# Patient Record
Sex: Female | Born: 1995 | Race: Black or African American | Hispanic: No | Marital: Single | Smoking: Former smoker
Health system: Southern US, Community
[De-identification: ages and names within clinical notes are randomized; demographics above are authoritative.]

## PROBLEM LIST (undated history)

## (undated) ENCOUNTER — Inpatient Hospital Stay (HOSPITAL_COMMUNITY): Payer: Self-pay

## (undated) DIAGNOSIS — O021 Missed abortion: Secondary | ICD-10-CM

## (undated) DIAGNOSIS — O169 Unspecified maternal hypertension, unspecified trimester: Secondary | ICD-10-CM

## (undated) DIAGNOSIS — O139 Gestational [pregnancy-induced] hypertension without significant proteinuria, unspecified trimester: Secondary | ICD-10-CM

## (undated) DIAGNOSIS — F32A Depression, unspecified: Secondary | ICD-10-CM

## (undated) DIAGNOSIS — F329 Major depressive disorder, single episode, unspecified: Secondary | ICD-10-CM

## (undated) HISTORY — PX: WISDOM TOOTH EXTRACTION: SHX21

## (undated) HISTORY — DX: Gestational (pregnancy-induced) hypertension without significant proteinuria, unspecified trimester: O13.9

---
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
## 2013-01-23 ENCOUNTER — Emergency Department (HOSPITAL_COMMUNITY): Payer: Medicaid Other

## 2013-01-23 ENCOUNTER — Emergency Department (HOSPITAL_COMMUNITY)
Admission: EM | Admit: 2013-01-23 | Discharge: 2013-01-23 | Disposition: A | Discharge status: Home / Self Care | Payer: Medicaid Other | Attending: Emergency Medicine | Admitting: Emergency Medicine

## 2013-01-23 ENCOUNTER — Encounter (HOSPITAL_COMMUNITY): Payer: Self-pay | Admitting: Nurse Practitioner

## 2013-01-23 DIAGNOSIS — Z3202 Encounter for pregnancy test, result negative: Secondary | ICD-10-CM | POA: Insufficient documentation

## 2013-01-23 DIAGNOSIS — R071 Chest pain on breathing: Secondary | ICD-10-CM | POA: Insufficient documentation

## 2013-01-23 DIAGNOSIS — R0789 Other chest pain: Secondary | ICD-10-CM

## 2013-01-23 DIAGNOSIS — J45909 Unspecified asthma, uncomplicated: Secondary | ICD-10-CM | POA: Insufficient documentation

## 2013-01-23 DIAGNOSIS — R300 Dysuria: Secondary | ICD-10-CM | POA: Insufficient documentation

## 2013-01-23 DIAGNOSIS — N72 Inflammatory disease of cervix uteri: Secondary | ICD-10-CM | POA: Insufficient documentation

## 2013-01-23 DIAGNOSIS — F17200 Nicotine dependence, unspecified, uncomplicated: Secondary | ICD-10-CM | POA: Insufficient documentation

## 2013-01-23 DIAGNOSIS — Z79899 Other long term (current) drug therapy: Secondary | ICD-10-CM | POA: Insufficient documentation

## 2013-01-23 LAB — WET PREP, GENITAL
Clue Cells Wet Prep HPF POC: NONE SEEN
Trich, Wet Prep: NONE SEEN

## 2013-01-23 LAB — CBC WITH DIFFERENTIAL/PLATELET
Basophils Absolute: 0 10*3/uL (ref 0.0–0.1)
Basophils Relative: 0 % (ref 0–1)
Eosinophils Relative: 2 % (ref 0–5)
Hemoglobin: 13.3 g/dL (ref 12.0–15.0)
MCH: 33.1 pg (ref 26.0–34.0)
Monocytes Absolute: 0.5 10*3/uL (ref 0.1–1.0)
Platelets: 233 10*3/uL (ref 150–400)
RBC: 4.02 MIL/uL (ref 3.87–5.11)
WBC: 6.3 10*3/uL (ref 4.0–10.5)

## 2013-01-23 LAB — COMPREHENSIVE METABOLIC PANEL
AST: 20 U/L (ref 0–37)
Albumin: 4.3 g/dL (ref 3.5–5.2)
Calcium: 9.8 mg/dL (ref 8.4–10.5)
Creatinine, Ser: 0.73 mg/dL (ref 0.50–1.10)
Total Protein: 7.3 g/dL (ref 6.0–8.3)

## 2013-01-23 LAB — URINALYSIS, ROUTINE W REFLEX MICROSCOPIC
Bilirubin Urine: NEGATIVE
Nitrite: NEGATIVE
Protein, ur: 30 mg/dL — AB
Specific Gravity, Urine: 1.025 (ref 1.005–1.030)
Urobilinogen, UA: 0.2 mg/dL (ref 0.0–1.0)

## 2013-01-23 MED ORDER — CEFTRIAXONE SODIUM 250 MG IJ SOLR
250.0000 mg | Freq: Once | INTRAMUSCULAR | Status: AC
  Administered 2013-01-23: 250 mg via INTRAMUSCULAR
  Filled 2013-01-23: qty 250

## 2013-01-23 MED ORDER — TRAMADOL HCL 50 MG PO TABS: 50.0000 mg | ORAL_TABLET | Freq: Four times a day (QID) | ORAL | Status: DC | PRN

## 2013-01-23 MED ORDER — KETOROLAC TROMETHAMINE 60 MG/2ML IM SOLN
60.0000 mg | Freq: Once | INTRAMUSCULAR | Status: AC
  Administered 2013-01-23: 60 mg via INTRAMUSCULAR
  Filled 2013-01-23: qty 2

## 2013-01-23 MED ORDER — GI COCKTAIL ~~LOC~~
30.0000 mL | Freq: Once | ORAL | Status: AC
  Administered 2013-01-23: 30 mL via ORAL
  Filled 2013-01-23: qty 30

## 2013-01-23 MED ORDER — DOXYCYCLINE HYCLATE 50 MG PO CAPS: 100.0000 mg | ORAL_CAPSULE | Freq: Two times a day (BID) | ORAL | Status: DC

## 2013-01-23 MED ORDER — LIDOCAINE HCL (PF) 1 % IJ SOLN
INTRAMUSCULAR | Status: AC
  Administered 2013-01-23: 0.9 mL
  Filled 2013-01-23: qty 5

## 2013-01-23 NOTE — ED Notes (Signed)
Patient transported to X-ray 

## 2013-01-23 NOTE — ED Notes (Signed)
Discharge instructions reviewed. Pt verbalized understanding.  

## 2013-01-23 NOTE — ED Notes (Signed)
C/o mid abd pain x 1 week, denies n/v/d, last BM a week ago. C/o dysuria x 2 days, denies vaginal discharge. C/o non radiating midsternal CP x 2 days, "but it's probably my asthma". Denies SOB, fever, chills, cold, cough.

## 2013-01-23 NOTE — ED Provider Notes (Signed)
History     CSN: [id number]  Arrival date & time 01/23/13  1240   First MD Initiated Contact with Patient 01/23/13 1308      Chief Complaint  Patient presents with  . Abdominal Pain    (Consider location/radiation/quality/duration/timing/severity/associated sxs/prior treatment) HPI Pt presents with 2 complaints.   Abdominal pain present for 1 week. States she has lower abd pain associated with painful urination. No fever or chills. No N/V/D/C. No vaginal d/c. Pt states she just started her period. She is sexually active but uses condoms.   Pt has had 3 days of gradual onset central chest pain. Worse with deep breathing and palpation. No cough, SOB. No recent travel/surgeries. Not of oral birth control pills. No trauma.  Past Medical History  Diagnosis Date  . Asthma     History reviewed. No pertinent past surgical history.  History reviewed. No pertinent family history.  History  Substance Use Topics  . Smoking status: Current Every Day Smoker    Types: Cigarettes  . Smokeless tobacco: Not on file  . Alcohol Use: Yes    OB History   Grav Para Term Preterm Abortions TAB SAB Ect Mult Living                  Review of Systems  Constitutional: Negative for fever and chills.  HENT: Negative for neck pain.   Respiratory: Negative for cough, chest tightness, shortness of breath and wheezing.   Cardiovascular: Positive for chest pain. Negative for palpitations and leg swelling.  Gastrointestinal: Positive for abdominal pain. Negative for nausea, vomiting, diarrhea, constipation and blood in stool.  Genitourinary: Positive for dysuria. Negative for frequency, flank pain, vaginal discharge and vaginal pain.  Musculoskeletal: Negative for myalgias and back pain.  Skin: Negative for rash and wound.  Neurological: Negative for dizziness, syncope, weakness, light-headedness, numbness and headaches.  All other systems reviewed and are negative.    Allergies  Tomato  Home  Medications   Current Outpatient Rx  Name  Route  Sig  Dispense  Refill  . albuterol (PROVENTIL HFA;VENTOLIN HFA) 108 (90 BASE) MCG/ACT inhaler   Inhalation   Inhale 2 puffs into the lungs every 4 (four) hours as needed for shortness of breath.         Marland Kitchen PRESCRIPTION MEDICATION   Inhalation   Inhale 2 puffs into the lungs every 6 (six) hours as needed (Asthma). Second inhaler (not albuterol)         . doxycycline (VIBRAMYCIN) 50 MG capsule   Oral   Take 2 capsules (100 mg total) by mouth 2 (two) times daily.   14 capsule   0   . traMADol (ULTRAM) 50 MG tablet   Oral   Take 1 tablet (50 mg total) by mouth every 6 (six) hours as needed for pain.   15 tablet   0     BP 104/51  Pulse 62  Temp(Src) 98.8 F (37.1 C) (Oral)  Resp 18  Ht 4\' 11"  (1.499 m)  Wt 106 lb (48.081 kg)  BMI 21.4 kg/m2  SpO2 100%  LMP 01/23/2013  Physical Exam  Nursing note and vitals reviewed. Constitutional: She is oriented to person, place, and time. She appears well-developed and well-nourished. No distress.  HENT:  Head: Normocephalic and atraumatic.  Mouth/Throat: Oropharynx is clear and moist.  Eyes: EOM are normal. Pupils are equal, round, and reactive to light.  Neck: Normal range of motion. Neck supple.  Cardiovascular: Normal rate and regular rhythm.  Pulmonary/Chest: Effort normal and breath sounds normal. No respiratory distress. She has no wheezes. She has no rales. She exhibits tenderness (reproduced chest pain with palpation of sternum and costal cartilage ).  Abdominal: Soft. Bowel sounds are normal. She exhibits no distension and no mass. There is tenderness (moderate epigastric ttp. mild bl lower quad tenderness. No rebound or guarding. ). There is no rebound and no guarding.  Genitourinary: Vaginal discharge found.  Small amount of blood mixed with purulent d/c in vaginal vault. +CMT. No fundal or adnexal ttp  Musculoskeletal: Normal range of motion. She exhibits no edema and  no tenderness.  No calf tenderness or swelling  Neurological: She is alert and oriented to person, place, and time.  Moves all ext without deficit  Skin: Skin is warm and dry. No rash noted. No erythema.  Psychiatric: She has a normal mood and affect. Her behavior is normal.    ED Course  Procedures (including critical care time)  Labs Reviewed  WET PREP, GENITAL - Abnormal; Notable for the following:    WBC, Wet Prep HPF POC FEW (*)    All other components within normal limits  URINALYSIS, ROUTINE W REFLEX MICROSCOPIC - Abnormal; Notable for the following:    Hgb urine dipstick SMALL (*)    Ketones, ur 15 (*)    Protein, ur 30 (*)    All other components within normal limits  CBC WITH DIFFERENTIAL - Abnormal; Notable for the following:    MCHC 36.4 (*)    All other components within normal limits  COMPREHENSIVE METABOLIC PANEL - Abnormal; Notable for the following:    Potassium 3.1 (*)    All other components within normal limits  GC/CHLAMYDIA PROBE AMP  LIPASE, BLOOD  URINE MICROSCOPIC-ADD ON  POCT PREGNANCY, URINE   Dg Chest 2 View  01/23/2013   *RADIOLOGY REPORT*  Clinical Data: Mid chest pain and abdominal pain.  CHEST - 2 VIEW  Comparison: Chest x-ray in 04/21/2012.  Findings: Lung volumes are normal.  No consolidative airspace disease.  No pleural effusions.  No pneumothorax.  No pulmonary nodule or mass noted.  Pulmonary vasculature and the cardiomediastinal silhouette are within normal limits.  IMPRESSION: 1. No radiographic evidence of acute cardiopulmonary disease.   Original Report Authenticated By: Trudie Reed, M.D.     1. Chest wall pain   2. Cervicitis      Date: 01/23/2013  Rate: 73  Rhythm: normal sinus rhythm  QRS Axis: normal  Intervals: normal  ST/T Wave abnormalities: normal  Conduction Disutrbances:none  Narrative Interpretation:   Old EKG Reviewed: none available    MDM          Loren Racer, MD 01/24/13 [phone]

## 2013-01-23 NOTE — ED Notes (Signed)
Pt c/o tightness and pain in chest, generalized abd pain, and painful urination for past days. A&Ox4, resp e/u

## 2013-01-25 LAB — GC/CHLAMYDIA PROBE AMP
CT Probe RNA: NEGATIVE
GC Probe RNA: POSITIVE — AB

## 2013-01-26 ENCOUNTER — Telehealth (HOSPITAL_COMMUNITY): Payer: Self-pay | Admitting: Emergency Medicine

## 2013-01-26 NOTE — ED Notes (Signed)
+  Gonorrhea. Patient treated with Rocephin and Doxycyline. Per protocol MD. [facility] faxed.

## 2013-01-26 NOTE — ED Notes (Signed)
Patient informed of positive results after id'd x 2 and informed of need to notify partner to be treated. 

## 2013-01-26 NOTE — ED Notes (Signed)
Patient has +Gonorrhea. °

## 2013-02-15 ENCOUNTER — Encounter (HOSPITAL_COMMUNITY): Payer: Self-pay | Admitting: Vascular Surgery

## 2013-02-15 ENCOUNTER — Emergency Department (HOSPITAL_COMMUNITY)
Admission: EM | Admit: 2013-02-15 | Discharge: 2013-02-15 | Disposition: A | Discharge status: Home / Self Care | Payer: Medicaid Other | Attending: Emergency Medicine | Admitting: Emergency Medicine

## 2013-02-15 DIAGNOSIS — J45909 Unspecified asthma, uncomplicated: Secondary | ICD-10-CM | POA: Insufficient documentation

## 2013-02-15 DIAGNOSIS — M25559 Pain in unspecified hip: Secondary | ICD-10-CM | POA: Insufficient documentation

## 2013-02-15 DIAGNOSIS — R102 Pelvic and perineal pain: Secondary | ICD-10-CM

## 2013-02-15 DIAGNOSIS — Z3202 Encounter for pregnancy test, result negative: Secondary | ICD-10-CM | POA: Insufficient documentation

## 2013-02-15 DIAGNOSIS — F17200 Nicotine dependence, unspecified, uncomplicated: Secondary | ICD-10-CM | POA: Insufficient documentation

## 2013-02-15 LAB — WET PREP, GENITAL: Clue Cells Wet Prep HPF POC: NONE SEEN

## 2013-02-15 LAB — POCT PREGNANCY, URINE: Preg Test, Ur: NEGATIVE

## 2013-02-15 LAB — URINALYSIS, ROUTINE W REFLEX MICROSCOPIC
Glucose, UA: NEGATIVE mg/dL
Ketones, ur: 15 mg/dL — AB
Leukocytes, UA: NEGATIVE
pH: 6 (ref 5.0–8.0)

## 2013-02-15 MED ORDER — AZITHROMYCIN 250 MG PO TABS
1000.0000 mg | ORAL_TABLET | Freq: Once | ORAL | Status: AC
  Administered 2013-02-15: 1000 mg via ORAL
  Filled 2013-02-15: qty 4

## 2013-02-15 MED ORDER — CEFTRIAXONE SODIUM 250 MG IJ SOLR
250.0000 mg | Freq: Once | INTRAMUSCULAR | Status: AC
  Administered 2013-02-15: 250 mg via INTRAMUSCULAR
  Filled 2013-02-15: qty 250

## 2013-02-15 MED ORDER — LIDOCAINE HCL (PF) 1 % IJ SOLN
INTRAMUSCULAR | Status: AC
  Administered 2013-02-15: 0.9 mL
  Filled 2013-02-15: qty 5

## 2013-02-15 NOTE — ED Notes (Signed)
Pt reports to the ED for eval of bilateral lower quadrant abdominal pain. Pt reports she has had trouble with this before and was seen here and d/c but she is unsure of what she was dx with. Pt reports she was given some pain medication and this help her pain for a while but now the pain is back. Pt denies any hx of abdominal surgeries. Pt denies any vaginal d/c or bleeding. Pt reports some dysuria yesterday but denies any urinary frequency or any symptoms at this time. Pt reports she had an STD last time she was here. Pt denies any N/V/D, fevers, or chills. Pt A&O x4. LMP was 5/17.

## 2013-02-15 NOTE — ED Provider Notes (Signed)
History    CSN: [id number] Arrival date & time 02/15/13  1046  First MD Initiated Contact with Patient 02/15/13 1209     Chief Complaint  Patient presents with  . Abdominal Pain   (Consider location/radiation/quality/duration/timing/severity/associated sxs/prior Treatment) The history is provided by the patient.   Pt presents to the ED for bilateral pelvic pain x 3 days.  Pt states she had prior episode of this earlier in the month and was d/c after being tx for Gc/Chl.  Was contacted by hospital and informed that culture was + for gonorrhea, partner was also treated at that time.  States pain went away for a while but has now returned and sx are the same- sharp, stabbing pelvic pain with dysuria.  No hematuria or increased urinary frequency.  Denies possibility of pregnancy, menstrual cycles are regular.  BM normal.  No new sexual partners- states she is not currently sexually active.  No vaginal discharge, fevers, sweats, chills, nausea, or vomiting.  Not currently on any form of birth control.  Past Medical History  Diagnosis Date  . Asthma    History reviewed. No pertinent past surgical history. No family history on file. History  Substance Use Topics  . Smoking status: Current Every Day Smoker -- 0.50 packs/day    Types: Cigarettes  . Smokeless tobacco: Not on file  . Alcohol Use: Yes     Comment: occasionally    OB History   Grav Para Term Preterm Abortions TAB SAB Ect Mult Living                 Review of Systems  Genitourinary: Positive for pelvic pain.  All other systems reviewed and are negative.    Allergies  Tomato  Home Medications   Current Outpatient Rx  Name  Route  Sig  Dispense  Refill  . albuterol (PROVENTIL HFA;VENTOLIN HFA) 108 (90 BASE) MCG/ACT inhaler   Inhalation   Inhale 2 puffs into the lungs every 4 (four) hours as needed for shortness of breath.         . doxycycline (VIBRAMYCIN) 50 MG capsule   Oral   Take 2 capsules (100 mg  total) by mouth 2 (two) times daily.   14 capsule   0   . PRESCRIPTION MEDICATION   Inhalation   Inhale 2 puffs into the lungs every 6 (six) hours as needed (Asthma). Second inhaler (not albuterol)         . traMADol (ULTRAM) 50 MG tablet   Oral   Take 1 tablet (50 mg total) by mouth every 6 (six) hours as needed for pain.   15 tablet   0    BP 93/71  Pulse 64  Temp(Src) 98.1 F (36.7 C) (Oral)  Resp 18  SpO2 100%  LMP 01/23/2013  Physical Exam  Nursing note and vitals reviewed. Constitutional: She is oriented to person, place, and time. She appears well-developed and well-nourished.  HENT:  Head: Normocephalic and atraumatic.  Mouth/Throat: Oropharynx is clear and moist.  Eyes: Conjunctivae and EOM are normal.  Neck: Normal range of motion. Neck supple.  Cardiovascular: Normal rate, regular rhythm and normal heart sounds.   Pulmonary/Chest: Effort normal and breath sounds normal. No respiratory distress. She has no wheezes.  Abdominal: Soft. Bowel sounds are normal. There is tenderness. There is no guarding, no CVA tenderness, no tenderness at McBurney's point and negative Murphy's sign.  Bilateral pelvic pain, mild  Genitourinary: There is no lesion on the right labia. There  is no lesion on the left labia. Cervix exhibits no motion tenderness. Right adnexum displays tenderness. Right adnexum displays no mass. Left adnexum displays tenderness. Left adnexum displays no mass. No bleeding around the vagina. Vaginal discharge found.  Scant, purulent vaginal discharge without odor, no bleeding, mild bilateral adnexal tenderness, no CMT  Musculoskeletal: Normal range of motion.  Neurological: She is alert and oriented to person, place, and time.  Skin: Skin is warm and dry.  Psychiatric: She has a normal mood and affect.    ED Course  Procedures (including critical care time) Labs Reviewed  WET PREP, GENITAL - Abnormal; Notable for the following:    WBC, Wet Prep HPF POC  FEW (*)    All other components within normal limits  URINALYSIS, ROUTINE W REFLEX MICROSCOPIC - Abnormal; Notable for the following:    APPearance HAZY (*)    Bilirubin Urine SMALL (*)    Ketones, ur 15 (*)    All other components within normal limits  GC/CHLAMYDIA PROBE AMP  POCT PREGNANCY, URINE   No results found.  1. Pelvic pain     MDM   U-preg negative.  U/a without signs of infection.  Wet prep with few clue cells.  Given bilateral pelvic/adnexal pain doubt ovarian torsion but do have concern for recurrent Gc/Chl- will tx empirically with rocephin and azithro.  Informed pt she will be notified if culture results are + but she has already been treated.  FU with [facility] if sx not improving.  Discussed plan with pt, she agreed.  Return precautions advised.  Garlon Hatchet, PA-C 02/15/13 [phone]

## 2013-02-15 NOTE — Discharge Instructions (Signed)
You will be notified in 48-72 hours if culture results are positive.  If so, you have already been treated and do not need to take further action. [facility] if symptoms not improving in the next week. Return to the ED for new or worsening symptoms.

## 2013-02-15 NOTE — ED Provider Notes (Signed)
Medical screening examination/treatment/procedure(s) were performed by non-physician practitioner and as supervising physician I was immediately available for consultation/collaboration.   Charles B. Bernette Mayers, MD 02/15/13 1451

## 2013-05-17 ENCOUNTER — Emergency Department (HOSPITAL_COMMUNITY)
Admission: EM | Admit: 2013-05-17 | Discharge: 2013-05-17 | Discharge status: Against Medical Advice | Payer: Medicaid Other | Attending: Emergency Medicine | Admitting: Emergency Medicine

## 2013-05-17 ENCOUNTER — Encounter (HOSPITAL_COMMUNITY): Payer: Self-pay | Admitting: Adult Health

## 2013-05-17 DIAGNOSIS — R109 Unspecified abdominal pain: Secondary | ICD-10-CM | POA: Insufficient documentation

## 2013-05-17 DIAGNOSIS — Z79899 Other long term (current) drug therapy: Secondary | ICD-10-CM | POA: Insufficient documentation

## 2013-05-17 DIAGNOSIS — N898 Other specified noninflammatory disorders of vagina: Secondary | ICD-10-CM | POA: Insufficient documentation

## 2013-05-17 DIAGNOSIS — Z3202 Encounter for pregnancy test, result negative: Secondary | ICD-10-CM | POA: Insufficient documentation

## 2013-05-17 DIAGNOSIS — F17200 Nicotine dependence, unspecified, uncomplicated: Secondary | ICD-10-CM | POA: Insufficient documentation

## 2013-05-17 DIAGNOSIS — R350 Frequency of micturition: Secondary | ICD-10-CM | POA: Insufficient documentation

## 2013-05-17 DIAGNOSIS — J45909 Unspecified asthma, uncomplicated: Secondary | ICD-10-CM | POA: Insufficient documentation

## 2013-05-17 LAB — URINE MICROSCOPIC-ADD ON

## 2013-05-17 LAB — URINALYSIS, ROUTINE W REFLEX MICROSCOPIC
Bilirubin Urine: NEGATIVE
Ketones, ur: NEGATIVE mg/dL
Nitrite: NEGATIVE
pH: 8.5 — ABNORMAL HIGH (ref 5.0–8.0)

## 2013-05-17 NOTE — ED Notes (Signed)
Pt. left AMA , refused to sign AMA form , pt. stated she has been here for a long time , nurse explained delay / process . EDP notified.

## 2013-05-17 NOTE — ED Notes (Addendum)
Presents with 10 days of lower abdominal pain associated with frequent urination, denies pain with urination, reports mild vaginal discharge yesterday. LMP: Sept 6, 2014.  Pain is intermittent described as "stomping on it"

## 2013-05-17 NOTE — ED Provider Notes (Signed)
CSN: [id number]     Arrival date & time 05/17/13  1522 History   First MD Initiated Contact with Patient 05/17/13 2008     Chief Complaint  Patient presents with  . Abdominal Pain   (Consider location/radiation/quality/duration/timing/severity/associated sxs/prior Treatment) Patient is a 17 y.o. female presenting with abdominal pain.  Abdominal Pain Pain location:  Suprapubic Pain quality: aching and pressure   Pain radiates to:  Does not radiate Pain severity:  Moderate Onset quality:  Gradual Duration:  2 weeks Timing:  Constant Progression:  Worsening Chronicity:  New Context: not recent illness, not recent sexual activity (5 months ago) and not retching   Relieved by:  Nothing Worsened by:  Nothing tried Ineffective treatments:  None tried Associated symptoms: vaginal discharge   Associated symptoms: no chest pain, no chills, no constipation, no cough, no diarrhea, no dysuria, no fever, no hematuria, no melena, no nausea, no shortness of breath, no sore throat, no vaginal bleeding and no vomiting     Past Medical History  Diagnosis Date  . Asthma    History reviewed. No pertinent past surgical history. History reviewed. No pertinent family history. History  Substance Use Topics  . Smoking status: Current Every Day Smoker -- 0.50 packs/day    Types: Cigarettes  . Smokeless tobacco: Not on file  . Alcohol Use: Yes     Comment: occasionally    OB History   Grav Para Term Preterm Abortions TAB SAB Ect Mult Living                 Review of Systems  Constitutional: Negative for fever and chills.  HENT: Negative for congestion, sore throat and rhinorrhea.   Eyes: Negative for photophobia and visual disturbance.  Respiratory: Negative for cough and shortness of breath.   Cardiovascular: Negative for chest pain and leg swelling.  Gastrointestinal: Positive for abdominal pain. Negative for nausea, vomiting, diarrhea, constipation and melena.  Endocrine: Negative for  polyphagia and polyuria.  Genitourinary: Positive for vaginal discharge. Negative for dysuria, hematuria, flank pain, vaginal bleeding and enuresis.  Musculoskeletal: Negative for back pain and gait problem.  Skin: Negative for color change and rash.  Neurological: Negative for dizziness, syncope, light-headedness and numbness.  Hematological: Negative for adenopathy. Does not bruise/bleed easily.  All other systems reviewed and are negative.    Allergies  Tomato  Home Medications   Current Outpatient Rx  Name  Route  Sig  Dispense  Refill  . albuterol (PROVENTIL HFA;VENTOLIN HFA) 108 (90 BASE) MCG/ACT inhaler   Inhalation   Inhale 2 puffs into the lungs every 4 (four) hours as needed for wheezing or shortness of breath.           BP 129/86  Pulse 86  Temp(Src) 98.1 F (36.7 C) (Oral)  Resp 14  SpO2 99% Physical Exam  Vitals reviewed. Constitutional: She is oriented to person, place, and time. She appears well-developed and well-nourished.  HENT:  Head: Normocephalic and atraumatic.  Right Ear: External ear normal.  Left Ear: External ear normal.  Eyes: Conjunctivae and EOM are normal. Pupils are equal, round, and reactive to light.  Neck: Normal range of motion. Neck supple.  Cardiovascular: Normal rate, regular rhythm, normal heart sounds and intact distal pulses.   Pulmonary/Chest: Effort normal and breath sounds normal.  Abdominal: Soft. Bowel sounds are normal. There is tenderness in the suprapubic area.  Musculoskeletal: Normal range of motion.  Neurological: She is alert and oriented to person, place, and time.  Skin:  Skin is warm and dry.    ED Course  Procedures (including critical care time) Labs Review Labs Reviewed  URINALYSIS, ROUTINE W REFLEX MICROSCOPIC - Abnormal; Notable for the following:    pH 8.5 (*)    Hgb urine dipstick MODERATE (*)    All other components within normal limits  URINE MICROSCOPIC-ADD ON - Abnormal; Notable for the following:     Squamous Epithelial / LPF MANY (*)    All other components within normal limits  POCT PREGNANCY, URINE  Pregnancy, urine POC (Final result)   Component (Lab Inquiry)      Collection Time Result Time Preg Test, Ur    05/17/13 17:28:00 05/17/13 17:29:04 NEGATIVE        THE SENSITIVITY OF THIS METHODOLOGY IS >24 mIU/mL                   Urinalysis, Routine w reflex microscopic (Final result) Abnormal Component (Lab Inquiry)      Collection Time Result Time Color, Urine APPearance Specific Gravity, Urine pH GLUCOSE U    05/17/13 17:07:00 05/17/13 17:51:46 YELLOW CLEAR 1.008 8.5 (H) NEGATIVE         Collection Time Result Time Hgb urine dipstick BILI UR Ketones, ur Protein, ur Urobilinogen, UA    05/17/13 17:07:00 05/17/13 17:51:46 MODERATE (A) NEGATIVE NEGATIVE NEGATIVE 0.2         Collection Time Result Time Nitrite LEUKOCYTES    05/17/13 17:07:00 05/17/13 17:51:46 NEGATIVE NEGATIVE                   Urine microscopic-add on (Final result) Abnormal Component (Lab Inquiry)      Collection Time Result Time Squamous Epithelial / LPF WBC U RBC / HPF    05/17/13 17:07:00 05/17/13 17:51:56 MANY (A) 0-2 21-50     Imaging Review No results found.  MDM   1. Abdominal pain   2. Urinary frequency   3. Vaginal discharge    17 y.o. female  with pertinent PMH of asthma presents with vaginal dc, suprapubic abd pain, increased urinary frequency x 2 weeks.  LMP earlier same month, last sexual activity 5 months ago, however with ho gonorrhea.  Physical exam as above.  I was to perform pelvic exam, explained to pt the need for same and possibility of STI and PID given lack of signs of frank UTI, however after I left with intention to return for pelvic exam with female supervision, pt left AMA without allowing me to explain risks of doing so.      Labs and imaging as above reviewed by myself and attending,Dr. Ranae Palms, with whom case was discussed.   1. Abdominal pain   2. Urinary frequency    3. Vaginal discharge         Noel Gerold, MD 05/17/13 2317

## 2013-05-19 ENCOUNTER — Encounter (HOSPITAL_COMMUNITY): Payer: Self-pay | Admitting: *Deleted

## 2013-05-19 ENCOUNTER — Emergency Department (HOSPITAL_COMMUNITY)
Admission: EM | Admit: 2013-05-19 | Discharge: 2013-05-19 | Disposition: A | Discharge status: Home / Self Care | Payer: Medicaid Other | Attending: Emergency Medicine | Admitting: Emergency Medicine

## 2013-05-19 DIAGNOSIS — R350 Frequency of micturition: Secondary | ICD-10-CM | POA: Insufficient documentation

## 2013-05-19 DIAGNOSIS — J45909 Unspecified asthma, uncomplicated: Secondary | ICD-10-CM | POA: Insufficient documentation

## 2013-05-19 DIAGNOSIS — Z79899 Other long term (current) drug therapy: Secondary | ICD-10-CM | POA: Insufficient documentation

## 2013-05-19 DIAGNOSIS — R1032 Left lower quadrant pain: Secondary | ICD-10-CM | POA: Insufficient documentation

## 2013-05-19 DIAGNOSIS — F17200 Nicotine dependence, unspecified, uncomplicated: Secondary | ICD-10-CM | POA: Insufficient documentation

## 2013-05-19 DIAGNOSIS — R1031 Right lower quadrant pain: Secondary | ICD-10-CM | POA: Insufficient documentation

## 2013-05-19 DIAGNOSIS — R109 Unspecified abdominal pain: Secondary | ICD-10-CM

## 2013-05-19 DIAGNOSIS — Z3202 Encounter for pregnancy test, result negative: Secondary | ICD-10-CM | POA: Insufficient documentation

## 2013-05-19 LAB — WET PREP, GENITAL: Yeast Wet Prep HPF POC: NONE SEEN

## 2013-05-19 LAB — POCT PREGNANCY, URINE: Preg Test, Ur: NEGATIVE

## 2013-05-19 LAB — URINALYSIS, ROUTINE W REFLEX MICROSCOPIC
Bilirubin Urine: NEGATIVE
Protein, ur: NEGATIVE mg/dL
Urobilinogen, UA: 0.2 mg/dL (ref 0.0–1.0)

## 2013-05-19 LAB — URINE MICROSCOPIC-ADD ON

## 2013-05-19 MED ORDER — IBUPROFEN 600 MG PO TABS: 600.0000 mg | ORAL_TABLET | Freq: Three times a day (TID) | ORAL | Status: DC

## 2013-05-19 MED ORDER — IBUPROFEN 400 MG PO TABS
600.0000 mg | ORAL_TABLET | Freq: Once | ORAL | Status: AC
  Administered 2013-05-19: 20:00:00 600 mg via ORAL
  Filled 2013-05-19 (×2): qty 1

## 2013-05-19 NOTE — ED Notes (Signed)
Pt is here for lower abdominal pain x 1 week.  No pain or burning with urination.  No fever or chills, no vaginal bleeding, pt is late for her period and feels "different".

## 2013-05-19 NOTE — ED Provider Notes (Signed)
CSN: [id number]     Arrival date & time 05/19/13  1449 History   First MD Initiated Contact with Patient 05/19/13 1725     Chief Complaint  Patient presents with  . Abdominal Pain   (Consider location/radiation/quality/duration/timing/severity/associated sxs/prior Treatment) Patient is a 17 y.o. female presenting with abdominal pain. The history is provided by the patient and medical records.  Abdominal Pain Pain location:  Suprapubic, LLQ and RLQ Pain quality: aching and fullness   Pain radiates to:  Does not radiate Pain severity:  Moderate Onset quality:  Gradual Duration:  1 week Timing:  Constant Progression:  Unchanged Chronicity:  New Context: recent sexual activity   Context: not alcohol use, not diet changes, not previous surgeries and not suspicious food intake   Relieved by:  None tried Worsened by:  Nothing tried Ineffective treatments:  None tried Associated symptoms: no chest pain, no chills, no cough, no diarrhea, no dysuria, no fever, no hematemesis, no hematuria, no nausea, no shortness of breath, no sore throat, no vaginal bleeding, no vaginal discharge and no vomiting   Risk factors: not pregnant     Past Medical History  Diagnosis Date  . Asthma    History reviewed. No pertinent past surgical history. No family history on file. History  Substance Use Topics  . Smoking status: Current Every Day Smoker -- 0.50 packs/day    Types: Cigarettes  . Smokeless tobacco: Not on file  . Alcohol Use: Yes     Comment: occasionally    OB History   Grav Para Term Preterm Abortions TAB SAB Ect Mult Living                 Review of Systems  Constitutional: Negative for fever and chills.  HENT: Negative for sore throat.   Respiratory: Negative for cough and shortness of breath.   Cardiovascular: Negative for chest pain.  Gastrointestinal: Positive for abdominal pain. Negative for nausea, vomiting, diarrhea and hematemesis.  Genitourinary: Positive for frequency.  Negative for dysuria, hematuria, decreased urine volume, vaginal bleeding, vaginal discharge, difficulty urinating, genital sores and pelvic pain.  All other systems reviewed and are negative.    Allergies  Tomato  Home Medications   Current Outpatient Rx  Name  Route  Sig  Dispense  Refill  . albuterol (PROVENTIL HFA;VENTOLIN HFA) 108 (90 BASE) MCG/ACT inhaler   Inhalation   Inhale 2 puffs into the lungs every 4 (four) hours as needed for wheezing or shortness of breath.          Marland Kitchen ibuprofen (ADVIL,MOTRIN) 600 MG tablet   Oral   Take 1 tablet (600 mg total) by mouth 3 (three) times daily with meals.   15 tablet   0    BP 121/67  Pulse 76  Temp(Src) 98.2 F (36.8 C) (Oral)  Resp 20  SpO2 100%  LMP 04/14/2013 Physical Exam  Nursing note and vitals reviewed. Constitutional: She is oriented to person, place, and time. She appears well-developed and well-nourished. No distress.  HENT:  Head: Normocephalic and atraumatic.  Mouth/Throat: Oropharynx is clear and moist. No oropharyngeal exudate.  Eyes: Conjunctivae and EOM are normal. Pupils are equal, round, and reactive to light.  Neck: Normal range of motion. Neck supple.  Cardiovascular: Normal rate, regular rhythm and normal heart sounds.  Exam reveals no gallop and no friction rub.   No murmur heard. Pulmonary/Chest: Effort normal and breath sounds normal. No respiratory distress. She has no wheezes. She has no rales. She exhibits no  tenderness.  Abdominal: Soft. She exhibits no distension. There is tenderness in the suprapubic area. There is no rigidity, no rebound, no guarding and no CVA tenderness.  Genitourinary: Vagina normal and uterus normal. Pelvic exam was performed with patient supine. No labial fusion. There is no rash, tenderness, lesion or injury on the right labia. There is no rash, tenderness, lesion or injury on the left labia. Cervix exhibits no motion tenderness, no discharge and no friability. Right  adnexum displays no mass, no tenderness and no fullness. Left adnexum displays no mass, no tenderness and no fullness.  Musculoskeletal: Normal range of motion. She exhibits no edema and no tenderness.  Lymphadenopathy:    She has no cervical adenopathy.  Neurological: She is alert and oriented to person, place, and time.  Skin: Skin is warm and dry. No rash noted. She is not diaphoretic.  Psychiatric: She has a normal mood and affect. Her behavior is normal. Judgment and thought content normal.    ED Course  Procedures (including critical care time) Labs Review Labs Reviewed  WET PREP, GENITAL - Abnormal; Notable for the following:    Clue Cells Wet Prep HPF POC FEW (*)    WBC, Wet Prep HPF POC FEW (*)    All other components within normal limits  URINALYSIS, ROUTINE W REFLEX MICROSCOPIC - Abnormal; Notable for the following:    APPearance CLOUDY (*)    Hgb urine dipstick MODERATE (*)    Ketones, ur 15 (*)    All other components within normal limits  GC/CHLAMYDIA PROBE AMP  URINE MICROSCOPIC-ADD ON  RPR  POCT PREGNANCY, URINE   Imaging Review No results found.  MDM   1. Abdominal pain     The patient is a 17 year old female with no significant past medical history presents with bilateral lower quadrant and suprapubic pain for the last week. She was seen at this institution 2 days ago where urinalysis as well as culture pregnancy test was obtained and show no infection. She left prior to pelvic exam AMA. She presents today because symptoms have not improved and she wants to continue workup. States that her last menstrual period was at the beginning of September, and she has been sexually active in the last 2 weeks. She does not have any known sexually transmitted infection exposures. She denies vaginal pain, vaginal bleeding, she does endorse a white vaginal discharge.  On arrival patient is afebrile and hemodynamically stable. Abdominal exam benign and she has no peritoneal  signs. Doubt significant ovarian pathology, appendicitis, obstruction in this patient. Presentation more consistent with possible urinary tract infection or vaginal infection. Will repeat urinalysis as well as point-of-care pregnancy. Pelvic exam performed and showed no cervical motion tenderness, no significant discharge, no masses. I am not concerned for PID or STI in this patient, however GC and Chlamydia and RPR have been sent. Wet prep pending. Point-of-care pregnancy negative and urinalysis also pending. Based on results, will determine whether treatment with antibiotics is indicated in this patient.  UA shows no signs of infection. Wet prep with clue cells, but no clinical symptoms of bacterial vaginosis so will not treat at this time. Small blood in urine but doubt nephrolithiasis based on lack of typical symptoms and no history and no back pain. Will treat with motrin TID for next five days. Return precautions for abdominal pain including worsening pain, fevers, nausea and emesis discussed. Will need PCP follow up. Will treat GC / Chl if results come back positive, but will not  treat empirically based on lack of symptoms.  Discussed with the patient return precautions and need for follow up with PCP. Patient voiced understanding. Stable for d/c. This patient was discussed with my attending, Dr. Freida Busman.  Dorna Leitz, MD 05/19/13 2225

## 2013-05-19 NOTE — ED Notes (Signed)
Pt c/o diffuse lower abd pain, sts she has been urinating more frequently. Reports she was here yesterday for the same but left because she didn't have the time to stay any longer. Pt reports she has noticed some vaginal d/c, denies odor. LMP First week of august. sts sometimes she has flashes of nausea then its gone. Denies diarrhea/vomiting. Pt in nad, skin warm and dry, resp e/u.

## 2013-05-20 LAB — GC/CHLAMYDIA PROBE AMP
CT Probe RNA: NEGATIVE
GC Probe RNA: NEGATIVE

## 2013-05-21 NOTE — ED Provider Notes (Signed)
I saw and evaluated the patient, reviewed the resident's note and I agree with the findings and plan.  Desani Sprung T Akesha Uresti, MD 05/21/13 0704 

## 2013-05-21 NOTE — ED Provider Notes (Signed)
I saw and evaluated the patient, reviewed the resident's note and I agree with the findings and plan.  Patient presents with vaginal discharge and abdominal pain with urinary symptoms. She left AMA before workup was complete.  Loren Racer, MD 05/21/13 [phone]

## 2013-08-22 ENCOUNTER — Encounter (HOSPITAL_COMMUNITY): Payer: Self-pay | Admitting: Emergency Medicine

## 2013-08-22 ENCOUNTER — Emergency Department (HOSPITAL_COMMUNITY)
Admission: EM | Admit: 2013-08-22 | Discharge: 2013-08-22 | Disposition: A | Discharge status: Home / Self Care | Payer: Medicaid Other | Attending: Emergency Medicine | Admitting: Emergency Medicine

## 2013-08-22 DIAGNOSIS — Z79899 Other long term (current) drug therapy: Secondary | ICD-10-CM | POA: Insufficient documentation

## 2013-08-22 DIAGNOSIS — F17200 Nicotine dependence, unspecified, uncomplicated: Secondary | ICD-10-CM | POA: Insufficient documentation

## 2013-08-22 DIAGNOSIS — H6012 Cellulitis of left external ear: Secondary | ICD-10-CM

## 2013-08-22 DIAGNOSIS — Z791 Long term (current) use of non-steroidal anti-inflammatories (NSAID): Secondary | ICD-10-CM | POA: Insufficient documentation

## 2013-08-22 DIAGNOSIS — H60399 Other infective otitis externa, unspecified ear: Secondary | ICD-10-CM | POA: Insufficient documentation

## 2013-08-22 DIAGNOSIS — J45909 Unspecified asthma, uncomplicated: Secondary | ICD-10-CM | POA: Insufficient documentation

## 2013-08-22 MED ORDER — IBUPROFEN 800 MG PO TABS: 800.0000 mg | ORAL_TABLET | Freq: Three times a day (TID) | ORAL | Status: DC | PRN

## 2013-08-22 MED ORDER — HYDROCODONE-ACETAMINOPHEN 5-325 MG PO TABS: 1.0000 | ORAL_TABLET | ORAL | Status: DC | PRN

## 2013-08-22 MED ORDER — CEPHALEXIN 500 MG PO CAPS: 500.0000 mg | ORAL_CAPSULE | Freq: Four times a day (QID) | ORAL | Status: DC

## 2013-08-22 NOTE — ED Provider Notes (Signed)
CSN: [id number]     Arrival date & time 08/22/13  1712 History  This chart was scribed for non-physician practitioner, Trixie Dredge, PA-C,working with Doug Sou, MD, by Karle Plumber, ED Scribe.  This patient was seen in room TR09C/TR09C and the patient's care was started at 6:57 PM.  Chief Complaint  Patient presents with  . Otalgia    top of left ear from percing   The history is provided by the patient. No language interpreter was used.   HPI Comments:  Sue Graves is a 18 y.o. female who presents to the Emergency Department complaining of worsening outer left ear pain for the past two weeks. Pt states she has had her upper ear pierced for some time without issue. She reports associated knot on the ear and states the pain has been increasing for the past two weeks and became severe yesterday. She denies taking anything for pain. She denies fever, chills, body aches, or trouble hearing.  Denies discharge.   Past Medical History  Diagnosis Date  . Asthma    History reviewed. No pertinent past surgical history. No family history on file. History  Substance Use Topics  . Smoking status: Current Every Day Smoker -- 0.50 packs/day    Types: Cigarettes  . Smokeless tobacco: Not on file  . Alcohol Use: Yes     Comment: occasionally    OB History   Grav Para Term Preterm Abortions TAB SAB Ect Mult Living                 Review of Systems  Constitutional: Negative for fever and chills.  HENT: Positive for ear pain (outter left upper ear pain). Negative for facial swelling.   Gastrointestinal: Negative for vomiting.  Musculoskeletal: Negative for myalgias and neck pain.  Skin: Negative for color change.  Allergic/Immunologic: Negative for immunocompromised state.  Neurological: Negative for headaches.  Hematological: Does not bruise/bleed easily.    Allergies  Tomato  Home Medications   Current Outpatient Rx  Name  Route  Sig  Dispense  Refill  . albuterol (PROVENTIL  HFA;VENTOLIN HFA) 108 (90 BASE) MCG/ACT inhaler   Inhalation   Inhale 2 puffs into the lungs every 4 (four) hours as needed for wheezing or shortness of breath.          Marland Kitchen ibuprofen (ADVIL,MOTRIN) 600 MG tablet   Oral   Take 1 tablet (600 mg total) by mouth 3 (three) times daily with meals.   15 tablet   0    Triage Vitals: BP 119/75  Pulse 69  Temp(Src) 98.1 F (36.7 C) (Oral)  Resp 18  Ht 4\' 11"  (1.499 m)  Wt 95 lb 9 oz (43.347 kg)  BMI 19.29 kg/m2  SpO2 100%  LMP 08/16/2013 Physical Exam  Nursing note and vitals reviewed. Constitutional: She appears well-developed and well-nourished. No distress.  HENT:  Head: Normocephalic and atraumatic.  Left pinna with piercing with darkened scab vs dried blood in the hole. Small area of indurated swelling. Posteriorly adjacent to the piercing. [name] is with edema throughout the anterior aspect. No erythema. No fluctuance. Tender to palpation.   Neck: Neck supple.  Pulmonary/Chest: Effort normal.  Neurological: She is alert.  Skin: She is not diaphoretic.    ED Course  Procedures (including critical care time) DIAGNOSTIC STUDIES: Oxygen Saturation is 100% on RA, normal by my interpretation.   COORDINATION OF CARE: 7:00 PM- Will prescribe antibiotics. Advised pt to use warm, moist compresses for 20 minutes every two  hours. Pt verbalizes understanding and agrees to plan.  Medications - No data to display  Labs Review Labs Reviewed - No data to display Imaging Review No results found.  EKG Interpretation   None       MDM   1. Pinna cellulitis, left    Pt with small area of cellulitis involving her left upper pinna around her piercing.  There is a small nodular area associated with the posterior aspect of the pierced area.  It is unclear if this is scaring vs very small abscess.  There is no fluctuance and no drainage.  Pt dc home with keflex, norco, instructions for warm moist compresses throughout the day.  Pt is aware  that she may need to return for I&D.  Discussed findings, treatment, and follow up  with patient.  Pt given return precautions.  Pt verbalizes understanding and agrees with plan.      I personally performed the services described in this documentation, which was scribed in my presence. The recorded information has been reviewed and is accurate.    Trixie Dredgemily Jakerra Floyd, PA-C 08/22/13 2340

## 2013-08-22 NOTE — Discharge Instructions (Signed)
Read the information below.  Use the prescribed medication as directed.  Please discuss all new medications with your pharmacist.  Do not take additional tylenol while taking the prescribed pain medication to avoid overdose. If there is any possibility that you might be pregnant, please let your health care provider know and discuss this with the pharmacist to ensure medication safety.  You may return to the Emergency Department at any time for worsening condition or any new symptoms that concern you.  Use warm moist compresses on your ear every two hours.  If you develop increase redness, swelling, uncontrolled pain, or fevers greater than 100.4, return to the ER immediately for a recheck.    Cellulitis Cellulitis is an infection of the skin and the tissue under the skin. The infected area is usually red and tender. This happens most often in the arms and lower legs. HOME CARE   Take your antibiotic medicine as told. Finish the medicine even if you start to feel better.  Keep the infected arm or leg raised (elevated).  Put a warm cloth on the area up to 4 times per day.  Only take medicines as told by your doctor.  Keep all doctor visits as told. GET HELP RIGHT AWAY IF:   You have a fever.  You feel very sleepy.  You throw up (vomit) or have watery poop (diarrhea).  You feel sick and have muscle aches and pains.  You see red streaks on the skin coming from the infected area.  Your red area gets bigger or turns a dark color.  Your bone or joint under the infected area is painful after the skin heals.  Your infection comes back in the same area or different area.  You have a puffy (swollen) bump in the infected area.  You have new symptoms. MAKE SURE YOU:   Understand these instructions.  Will watch your condition.  Will get help right away if you are not doing well or get worse. Document Released: 01/22/2008 Document Revised: 02/04/2012 Document Reviewed:  10/21/2011 [facility] Patient Information 2014 [place], [place].

## 2013-08-22 NOTE — ED Notes (Signed)
Pt c/o left upper ear pain from piercing x 2 weeks, Pain increased today, Area red, painful to touch.

## 2013-08-23 NOTE — ED Provider Notes (Signed)
Medical screening examination/treatment/procedure(s) were performed by non-physician practitioner and as supervising physician I was immediately available for consultation/collaboration.  EKG Interpretation   None        Meriel Kelliher, MD 08/23/13 0032 

## 2013-08-31 ENCOUNTER — Encounter (HOSPITAL_COMMUNITY): Payer: Self-pay | Admitting: Emergency Medicine

## 2013-08-31 ENCOUNTER — Other Ambulatory Visit (HOSPITAL_COMMUNITY)
Admission: RE | Admit: 2013-08-31 | Discharge: 2013-08-31 | Disposition: A | Discharge status: Home / Self Care | Payer: Medicaid Other | Source: Ambulatory Visit | Attending: Family Medicine | Admitting: Family Medicine

## 2013-08-31 ENCOUNTER — Emergency Department (INDEPENDENT_AMBULATORY_CARE_PROVIDER_SITE_OTHER)
Admission: EM | Admit: 2013-08-31 | Discharge: 2013-08-31 | Disposition: A | Discharge status: Home / Self Care | Payer: Medicaid Other | Source: Home / Self Care | Attending: Family Medicine | Admitting: Family Medicine

## 2013-08-31 DIAGNOSIS — N760 Acute vaginitis: Secondary | ICD-10-CM | POA: Insufficient documentation

## 2013-08-31 DIAGNOSIS — Z202 Contact with and (suspected) exposure to infections with a predominantly sexual mode of transmission: Secondary | ICD-10-CM

## 2013-08-31 DIAGNOSIS — Z113 Encounter for screening for infections with a predominantly sexual mode of transmission: Secondary | ICD-10-CM | POA: Insufficient documentation

## 2013-08-31 LAB — POCT URINALYSIS DIP (DEVICE)
BILIRUBIN URINE: NEGATIVE
Glucose, UA: NEGATIVE mg/dL
Hgb urine dipstick: NEGATIVE
Ketones, ur: NEGATIVE mg/dL
LEUKOCYTES UA: NEGATIVE
Nitrite: NEGATIVE
Protein, ur: 30 mg/dL — AB
SPECIFIC GRAVITY, URINE: 1.02 (ref 1.005–1.030)
Urobilinogen, UA: 1 mg/dL (ref 0.0–1.0)
pH: 7 (ref 5.0–8.0)

## 2013-08-31 LAB — POCT PREGNANCY, URINE: PREG TEST UR: NEGATIVE

## 2013-08-31 MED ORDER — CEFTRIAXONE SODIUM 1 G IJ SOLR
250.0000 mg | Freq: Once | INTRAMUSCULAR | Status: AC
  Administered 2013-08-31: 250 mg via INTRAMUSCULAR

## 2013-08-31 MED ORDER — METRONIDAZOLE 500 MG PO TABS: 500.0000 mg | ORAL_TABLET | Freq: Two times a day (BID) | ORAL | Status: DC

## 2013-08-31 MED ORDER — LIDOCAINE HCL (PF) 1 % IJ SOLN
INTRAMUSCULAR | Status: AC
  Filled 2013-08-31: qty 5

## 2013-08-31 MED ORDER — AZITHROMYCIN 250 MG PO TABS
ORAL_TABLET | ORAL | Status: AC
  Filled 2013-08-31: qty 4

## 2013-08-31 MED ORDER — CEFTRIAXONE SODIUM 250 MG IJ SOLR
INTRAMUSCULAR | Status: AC
  Filled 2013-08-31: qty 250

## 2013-08-31 MED ORDER — AZITHROMYCIN 250 MG PO TABS
1000.0000 mg | ORAL_TABLET | Freq: Once | ORAL | Status: AC
  Administered 2013-08-31: 1000 mg via ORAL

## 2013-08-31 NOTE — ED Notes (Signed)
C/o lower abdominal cramping and nausea. Burning sensation with the start of urination.  Was told partner has std. Denies any abnormal discharge.

## 2013-08-31 NOTE — ED Provider Notes (Signed)
CSN: [id number]     Arrival date & time 08/31/13  1408 History   First MD Initiated Contact with Patient 08/31/13 1546     Chief Complaint  Patient presents with  . Abdominal Pain  . Exposure to STD   (Consider location/radiation/quality/duration/timing/severity/associated sxs/prior Treatment) Patient is a 18 y.o. female presenting with STD exposure. The history is provided by the patient.  Exposure to STD This is a recurrent problem. The current episode started more than 2 days ago. The problem has not changed since onset.Associated symptoms include abdominal pain.    Past Medical History  Diagnosis Date  . Asthma    History reviewed. No pertinent past surgical history. History reviewed. No pertinent family history. History  Substance Use Topics  . Smoking status: Current Every Day Smoker -- 0.50 packs/day    Types: Cigarettes  . Smokeless tobacco: Not on file  . Alcohol Use: Yes     Comment: occasionally    OB History   Grav Para Term Preterm Abortions TAB SAB Ect Mult Living                 Review of Systems  Constitutional: Negative.   Gastrointestinal: Positive for abdominal pain.  Genitourinary: Positive for dysuria and vaginal discharge. Negative for urgency, menstrual problem and pelvic pain.    Allergies  Tomato  Home Medications   Current Outpatient Rx  Name  Route  Sig  Dispense  Refill  . albuterol (PROVENTIL HFA;VENTOLIN HFA) 108 (90 BASE) MCG/ACT inhaler   Inhalation   Inhale 2 puffs into the lungs every 4 (four) hours as needed for wheezing or shortness of breath.          . cephALEXin (KEFLEX) 500 MG capsule   Oral   Take 1 capsule (500 mg total) by mouth 4 (four) times daily.   28 capsule   0   . HYDROcodone-acetaminophen (NORCO/VICODIN) 5-325 MG per tablet   Oral   Take 1 tablet by mouth every 4 (four) hours as needed for moderate pain or severe pain.   10 tablet   0   . ibuprofen (ADVIL,MOTRIN) 600 MG tablet   Oral   Take 1 tablet  (600 mg total) by mouth 3 (three) times daily with meals.   15 tablet   0   . ibuprofen (ADVIL,MOTRIN) 800 MG tablet   Oral   Take 1 tablet (800 mg total) by mouth every 8 (eight) hours as needed for mild pain or moderate pain.   21 tablet   0   . metroNIDAZOLE (FLAGYL) 500 MG tablet   Oral   Take 1 tablet (500 mg total) by mouth 2 (two) times daily.   14 tablet   0    BP 110/62  Pulse 67  Temp(Src) 97.6 F (36.4 C) (Oral)  Resp 16  SpO2 100%  LMP 08/16/2013 Physical Exam  Nursing note and vitals reviewed. Constitutional: She is oriented to person, place, and time. She appears well-developed and well-nourished.  Abdominal: Soft. Bowel sounds are normal.  Genitourinary: Uterus normal. There is no rash or tenderness on the right labia. There is no rash or tenderness on the left labia. Cervix exhibits discharge. Cervix exhibits no motion tenderness. Right adnexum displays no mass and no tenderness. Left adnexum displays no mass and no tenderness. No erythema or tenderness around the vagina. Vaginal discharge found.  Neurological: She is alert and oriented to person, place, and time.  Skin: Skin is warm and dry.    ED  Course  Procedures (including critical care time) Labs Review Labs Reviewed  POCT URINALYSIS DIP (DEVICE) - Abnormal; Notable for the following:    Protein, ur 30 (*)    All other components within normal limits  POCT PREGNANCY, URINE  CERVICOVAGINAL ANCILLARY ONLY   Imaging Review No results found.  EKG Interpretation    Date/Time:    Ventricular Rate:    PR Interval:    QRS Duration:   QT Interval:    QTC Calculation:   R Axis:     Text Interpretation:              MDM      Linna HoffJames D Kishawn Pickar, MD 08/31/13 1626

## 2013-08-31 NOTE — Discharge Instructions (Signed)
Use medicine as prescribed, we will call if tests show a need for other treatment.  

## 2013-09-02 NOTE — ED Notes (Signed)
GC/Chlamydia neg., Affirm: Candida and Trich neg., Gardnerella pos. Pt. Adequately treated with Flagyl. Vassie MoselleYork, Sharone Almond M 09/02/2013

## 2013-11-21 ENCOUNTER — Emergency Department (HOSPITAL_COMMUNITY)
Admission: EM | Admit: 2013-11-21 | Discharge: 2013-11-21 | Disposition: A | Discharge status: Home / Self Care | Payer: Medicaid Other | Attending: Emergency Medicine | Admitting: Emergency Medicine

## 2013-11-21 ENCOUNTER — Encounter (HOSPITAL_COMMUNITY): Payer: Self-pay | Admitting: Emergency Medicine

## 2013-11-21 DIAGNOSIS — F17200 Nicotine dependence, unspecified, uncomplicated: Secondary | ICD-10-CM | POA: Insufficient documentation

## 2013-11-21 DIAGNOSIS — Z792 Long term (current) use of antibiotics: Secondary | ICD-10-CM | POA: Insufficient documentation

## 2013-11-21 DIAGNOSIS — J45909 Unspecified asthma, uncomplicated: Secondary | ICD-10-CM | POA: Insufficient documentation

## 2013-11-21 DIAGNOSIS — K5900 Constipation, unspecified: Secondary | ICD-10-CM | POA: Insufficient documentation

## 2013-11-21 DIAGNOSIS — Z79899 Other long term (current) drug therapy: Secondary | ICD-10-CM | POA: Insufficient documentation

## 2013-11-21 DIAGNOSIS — R109 Unspecified abdominal pain: Secondary | ICD-10-CM

## 2013-11-21 DIAGNOSIS — Z3202 Encounter for pregnancy test, result negative: Secondary | ICD-10-CM | POA: Insufficient documentation

## 2013-11-21 LAB — URINALYSIS, ROUTINE W REFLEX MICROSCOPIC
Bilirubin Urine: NEGATIVE
Glucose, UA: NEGATIVE mg/dL
Ketones, ur: NEGATIVE mg/dL
Leukocytes, UA: NEGATIVE
Nitrite: NEGATIVE
Protein, ur: NEGATIVE mg/dL
Specific Gravity, Urine: 1.027 (ref 1.005–1.030)
Urobilinogen, UA: 1 mg/dL (ref 0.0–1.0)
pH: 6.5 (ref 5.0–8.0)

## 2013-11-21 LAB — COMPREHENSIVE METABOLIC PANEL
ALT: 12 U/L (ref 0–35)
AST: 19 U/L (ref 0–37)
Albumin: 4.3 g/dL (ref 3.5–5.2)
Alkaline Phosphatase: 80 U/L (ref 39–117)
BUN: 10 mg/dL (ref 6–23)
CO2: 22 meq/L (ref 19–32)
Calcium: 9.6 mg/dL (ref 8.4–10.5)
Chloride: 102 mEq/L (ref 96–112)
Creatinine, Ser: 0.67 mg/dL (ref 0.50–1.10)
GFR calc Af Amer: >90 mL/min (ref >90)
Glucose, Bld: 87 mg/dL (ref 70–99)
POTASSIUM: 3.3 meq/L — AB (ref 3.7–5.3)
SODIUM: 140 meq/L (ref 137–147)
Total Bilirubin: 0.4 mg/dL (ref 0.3–1.2)
Total Protein: 7.5 g/dL (ref 6.0–8.3)

## 2013-11-21 LAB — CBC WITH DIFFERENTIAL/PLATELET
Basophils Absolute: 0 10*3/uL (ref 0.0–0.1)
Basophils Relative: 0 % (ref 0–1)
Eosinophils Absolute: 0.1 10*3/uL (ref 0.0–0.7)
Eosinophils Relative: 1 % (ref 0–5)
HCT: 38.7 % (ref 36.0–46.0)
Hemoglobin: 13.6 g/dL (ref 12.0–15.0)
Lymphocytes Relative: 28 % (ref 12–46)
Lymphs Abs: 1.8 10*3/uL (ref 0.7–4.0)
MCH: 32.5 pg (ref 26.0–34.0)
MCHC: 35.1 g/dL (ref 30.0–36.0)
MCV: 92.6 fL (ref 78.0–100.0)
Monocytes Absolute: 0.3 10*3/uL (ref 0.1–1.0)
Monocytes Relative: 5 % (ref 3–12)
Neutro Abs: 4.1 10*3/uL (ref 1.7–7.7)
Neutrophils Relative %: 66 % (ref 43–77)
Platelets: 241 10*3/uL (ref 150–400)
RBC: 4.18 MIL/uL (ref 3.87–5.11)
RDW: 12.8 % (ref 11.5–15.5)
WBC: 6.2 10*3/uL (ref 4.0–10.5)

## 2013-11-21 LAB — PREGNANCY, URINE: Preg Test, Ur: NEGATIVE

## 2013-11-21 LAB — URINE MICROSCOPIC-ADD ON

## 2013-11-21 LAB — WET PREP, GENITAL
CLUE CELLS WET PREP: NONE SEEN
Trich, Wet Prep: NONE SEEN
WBC, Wet Prep HPF POC: NONE SEEN
Yeast Wet Prep HPF POC: NONE SEEN

## 2013-11-21 MED ORDER — POLYETHYLENE GLYCOL 3350 17 GM/SCOOP PO POWD: ORAL | Status: DC

## 2013-11-21 NOTE — ED Notes (Signed)
Pt reports lower abd pain x 1 week, denies n/v/d, urinary or vaginal symptoms.

## 2013-11-21 NOTE — Discharge Instructions (Signed)
Your lab work was completely unremarkable today.    Abdominal (belly) pain can be caused by many things. Your caregiver performed an examination and possibly ordered blood/urine tests and imaging (CT scan, x-rays, ultrasound). Many cases can be observed and treated at home after initial evaluation in the emergency department. Even though you are being discharged home, abdominal pain can be unpredictable. Therefore, you need a repeated exam if your pain does not resolve, returns, or worsens. Most patients with abdominal pain don't have to be admitted to the hospital or have surgery, but serious problems like appendicitis and gallbladder attacks can start out as nonspecific pain. Many abdominal conditions cannot be diagnosed in one visit, so follow-up evaluations are very important. SEEK IMMEDIATE MEDICAL ATTENTION IF: The pain does not go away or becomes severe.  A temperature above 101 develops.  Repeated vomiting occurs (multiple episodes).  The pain becomes localized to portions of the abdomen. The right side could possibly be appendicitis. In an adult, the left lower portion of the abdomen could be colitis or diverticulitis.  Blood is being passed in stools or vomit (bright red or black tarry stools).  Return also if you develop chest pain, difficulty breathing, dizziness or fainting, or become confused, poorly responsive, or inconsolable (young children).  Constipation, Adult Constipation is when a person has fewer than 3 bowel movements a week; has difficulty having a bowel movement; or has stools that are dry, hard, or larger than normal. As people grow older, constipation is more common. If you try to fix constipation with medicines that make you have a bowel movement (laxatives), the problem may get worse. Long-term laxative use may cause the muscles of the colon to become weak. A low-fiber diet, not taking in enough fluids, and taking certain medicines may make constipation worse. CAUSES    Certain medicines, such as antidepressants, pain medicine, iron supplements, antacids, and water pills.   Certain diseases, such as diabetes, irritable bowel syndrome (IBS), thyroid disease, or depression.   Not drinking enough water.   Not eating enough fiber-rich foods.   Stress or travel.  Lack of physical activity or exercise.  Not going to the restroom when there is the urge to have a bowel movement.  Ignoring the urge to have a bowel movement.  Using laxatives too much. SYMPTOMS   Having fewer than 3 bowel movements a week.   Straining to have a bowel movement.   Having hard, dry, or larger than normal stools.   Feeling full or bloated.   Pain in the lower abdomen.  Not feeling relief after having a bowel movement. DIAGNOSIS  Your caregiver will take a medical history and perform a physical exam. Further testing may be done for severe constipation. Some tests may include:   A barium enema X-ray to examine your rectum, colon, and sometimes, your small intestine.  A sigmoidoscopy to examine your lower colon.  A colonoscopy to examine your entire colon. TREATMENT  Treatment will depend on the severity of your constipation and what is causing it. Some dietary treatments include drinking more fluids and eating more fiber-rich foods. Lifestyle treatments may include regular exercise. If these diet and lifestyle recommendations do not help, your caregiver may recommend taking over-the-counter laxative medicines to help you have bowel movements. Prescription medicines may be prescribed if over-the-counter medicines do not work.  HOME CARE INSTRUCTIONS   Increase dietary fiber in your diet, such as fruits, vegetables, whole grains, and beans. Limit high-fat and processed sugars in your  diet, such as [place] fries, hamburgers, cookies, candies, and soda.   A fiber supplement may be added to your diet if you cannot get enough fiber from foods.   Drink enough  fluids to keep your urine clear or pale yellow.   Exercise regularly or as directed by your caregiver.   Go to the restroom when you have the urge to go. Do not hold it.  Only take medicines as directed by your caregiver. Do not take other medicines for constipation without talking to your caregiver first. SEEK IMMEDIATE MEDICAL CARE IF:   You have bright red blood in your stool.   Your constipation lasts for more than 4 days or gets worse.   You have abdominal or rectal pain.   You have thin, pencil-like stools.  You have unexplained weight loss. MAKE SURE YOU:   Understand these instructions.  Will watch your condition.  Will get help right away if you are not doing well or get worse. Document Released: 05/03/2004 Document Revised: 10/28/2011 Document Reviewed: 05/17/2013 [facility] Patient Information 2014 [place], [place].  Fiber Content in Foods Drinking plenty of fluids and consuming foods high in fiber can help with constipation. See the list below for the fiber content of some common foods. Starches and Grains / Dietary Fiber (g)  Cheerios, 1 cup / 3 g  Kellogg's Corn Flakes, 1 cup / 0.7 g  Rice Krispies, 1  cup / 0.3 g  Quaker Oat Life Cereal,  cup / 2.1 g  Oatmeal, instant (cooked),  cup / 2 g  Kellogg's Frosted Mini Wheats, 1 cup / 5.1 g  Rice, brown, long-grain (cooked), 1 cup / 3.5 g  Rice, white, long-grain (cooked), 1 cup / 0.6 g  Macaroni, cooked, enriched, 1 cup / 2.5 g Legumes / Dietary Fiber (g)  Beans, baked, canned, plain or vegetarian,  cup / 5.2 g  Beans, kidney, canned,  cup / 6.8 g  Beans, pinto, dried (cooked),  cup / 7.7 g  Beans, pinto, canned,  cup / 5.5 g Breads and Crackers / Dietary Fiber (g)  Graham crackers, plain or honey, 2 squares / 0.7 g  Saltine crackers, 3 squares / 0.3 g  Pretzels, plain, salted, 10 pieces / 1.8 g  Bread, whole-wheat, 1 slice / 1.9 g  Bread, white, 1 slice / 0.7 g  Bread, raisin, 1  slice / 1.2 g  Bagel, plain, 3 oz / 2 g  Tortilla, flour, 1 oz / 0.9 g  Tortilla, corn, 1 small / 1.5 g  Bun, hamburger or hotdog, 1 small / 0.9 g Fruits / Dietary Fiber (g)  Apple, raw with skin, 1 medium / 4.4 g  Applesauce, sweetened,  cup / 1.5 g  Banana,  medium / 1.5 g  Grapes, 10 grapes / 0.4 g  Orange, 1 small / 2.3 g  Raisin, 1.5 oz / 1.6 g  Melon, 1 cup / 1.4 g Vegetables / Dietary Fiber (g)  Green beans, canned,  cup / 1.3 g  Carrots (cooked),  cup / 2.3 g  Broccoli (cooked),  cup / 2.8 g  Peas, frozen (cooked),  cup / 4.4 g  Potatoes, mashed,  cup / 1.6 g  Lettuce, 1 cup / 0.5 g  Corn, canned,  cup / 1.6 g  Tomato,  cup / 1.1 g Document Released: 12/22/2006 Document Revised: 10/28/2011 Document Reviewed: 02/16/2007 ExitCare Patient Information 2014 [place], [place].   Emergency Department Resource Guide 1) Find a Doctor and Pay Out of  Pocket Although you won't have to find out who is covered by your insurance plan, it is a good idea to ask around and get recommendations. You will then need to call the office and see if the doctor you have chosen will accept you as a new patient and what types of options they offer for patients who are self-pay. Some doctors offer discounts or will set up payment plans for their patients who do not have insurance, but you will need to ask so you aren't surprised when you get to your appointment.  2) Contact Your Local Health Department Not all health departments have doctors that can see patients for sick visits, but many do, so it is worth a call to see if yours does. If you don't know where your local health department is, you can check in your phone book. The CDC also has a tool to help you locate your state's health department, and many state websites also have listings of all of their local health departments.  3) Find a Walk-in Clinic If your illness is not likely to be very severe or complicated, you may  want to try a walk in clinic. These are popping up all over the country in pharmacies, drugstores, and shopping centers. They're usually staffed by nurse practitioners or physician assistants that have been trained to treat common illnesses and complaints. They're usually fairly quick and inexpensive. However, if you have serious medical issues or chronic medical problems, these are probably not your best option.  No Primary Care Doctor: - Call Health Connect at  [phone] - they can help you locate a primary care doctor that  accepts your insurance, provides certain services, etc. - Physician Referral Service- [phone]  Chronic Pain Problems: Organization         Address  Phone   Notes  [facility]  [phone] Patients need to be referred by their primary care doctor.   Medication Assistance: Organization         Address  Phone   Notes  [facility] Medication [facility] [address] [place] [place], [place] [address] [phone] --Must be a resident of [facility] -- Must have NO insurance coverage whatsoever (no Medicaid/ Medicare, etc.) -- The pt. MUST have a primary care doctor that directs their care regularly and follows them in the community   [facility]  [phone]   [employer]  [phone]    Agencies that provide inexpensive medical care: Organization         Address  Phone   Notes  [facility]  [phone]   [facility]    [phone]   [facility] [address] [place], [place] [address] [phone]   [facility] [address] [place], [place] [phone]   Planned Parenthood    [phone]   [facility]    [phone]   [facility]  [address], [place] Phone:  [phone], Fax:  [phone] Hours of Operation:  9 am - 6 pm, M-F.  Also accepts Medicaid/Medicare and self-pay.   [facility]  [address],  Phone: [phone], Fax: [phone]. Hours of Operation:  8:30 am - 5:30 pm, M-F.  Also accepts Medicaid and self-pay.  [facility] [place] [address], [employer] Phone: [phone]   [facility] [address] [place], [place] [phone], Ext. [phone] Mondays & Thursdays: 7-9 AM.  First 15 patients are seen on a first come, first serve basis.    Medicaid-accepting [facility] Providers:  Organization         Address  Phone   Notes  [facility] [address], [place] [phone] Also accepts self-pay patients.  [facility] [address] Laurell Josephs [place], [place]  [phone]   [facility] [address], [place] [phone]   [facility] [address], [place] [phone]   Renaye Rakers [address], [place]   [phone] Only accepts [place] Access [place] patients after they have their name applied to their card.   Self-Pay (no insurance) in [facility]:  Organization         Address  Phone   Notes  Sickle Cell Patients, [facility] Internal Medicine [address] [place], [place] [phone]   [facility] [address] [place], [place] [phone]   [facility] [place]  [address], [place] [phone]   [facility]/Dr. Osei-Bonsu  [address] [place] or [address], [place] [phone] Phone number for both [place] and [place] locations is the same.  [facility] [address] [place] [phone]   [facility] [address] [place] or [address] [phone]   [facility] [address] [place] [phone], phone; [phone], fax Sees patients 1st and 3rd Saturday of every month.  Must not qualify for public or private insurance (i.e. Medicaid, Medicare, [place] Health Choice, Veterans' Benefits)  Household income should be no more than 200% of the poverty level The clinic cannot treat you if you are pregnant or think you are pregnant  Sexually transmitted diseases are not treated at the clinic.    Dental Care: Organization         Address  Phone  Notes  [facility] [address] [place], [place] [phone] Accepts children up to age 74 who are enrolled in [place] or [place] Health Choice; pregnant women with a Medicaid card; and children who have applied for Medicaid or [place] Health Choice, but were declined, whose parents can pay a reduced fee at time of service.  [facility] of [facility]  [address], [place] [phone] Accepts children up to age 67 who are enrolled in [place] or [place] Health Choice; pregnant women with a Medicaid card; and children who have applied for Medicaid or [place] Health Choice, but were declined, whose parents can pay a reduced fee at time of service.  [facility]  [address] [place], [place] [phone] Patients are seen by appointment only. Walk-ins are not accepted. [facility] will see patients 2 years of age and older. Monday - Tuesday (8am-5pm) Most Wednesdays (8:30-5pm) $30 per visit, cash only  [facility]  [address], [facility] [phone] Patients are seen by appointment only. Walk-ins are not accepted. [facility] will see patients 70 years of age and older. One Wednesday Evening (Monthly: Volunteer Based).  $30 per visit, cash only  [employer] of Dentistry Clinics  [phone] for adults; Children under age 53, call Graduate Pediatric Dentistry at [phone]. Children aged 16-14, please call [phone] to request a pediatric application.  Dental services are provided in all areas of dental care including fillings, crowns and bridges, complete and partial dentures, implants, gum treatment, root canals, and extractions. Preventive care is also provided. Treatment is provided to both adults and children. Patients are selected via a lottery and there is often a waiting list.   [facility] [address], [place]  [phone] [email or web address]   [facility] [address] [place], [place] [phone], Ext. [phone] Second and Fourth Thursday of each month, opens at 6:30 AM; Clinic ends at 9 AM.  Patients are seen on a first-come first-served basis, and a limited number are seen during each clinic.   [facility]  [address] Ether Griffins [place], [place] [phone]   Eligibility Requirements You must have lived in [place], [place], or [place] counties for at least the last three months.   You cannot be eligible for state or federal sponsored [place], including [employer], [place], or [employer].   You generally cannot be eligible for healthcare insurance through your employer.    How to apply: Eligibility screenings are held every Tuesday and Wednesday afternoon from 1:00 pm until 4:00 pm. You do not need an appointment for the interview!  [facility] [address], [place], [place] [phone]   [facility] Health Department  [phone]   [facility] Health Department  [phone]   [facility]  [phone]    Behavioral Health Resources in the Community: Intensive Outpatient Programs Organization         Address  Phone  Notes  [facility] [address] [place], [place] [phone]   [facility] Out[facility] [address] [place], [place] [phone]   [facility] [address] [place], [place]  [phone]    [facility] [address],  [place], [place] [phone] or [phone]   Substance Abuse Resources Organization         Address  Phone  Notes  [facility]  [phone]   [facility]  [phone]   [facility]  [phone]   Floydene Flock  [phone]   Residential & Outpatient Substance Abuse Program  [phone]   Psychological Services Organization         Address  Phone  Notes  [facility] Behavioral Health  [phone]   [facility] Services  [phone]   [facility] [address] [place] [phone] or [phone]    Mobile Crisis Teams Organization         Address  Phone  Notes  [facility], Mobile Crisis Care Unit  [phone]   [facility]  [address] [place], [place] [phone]   Doristine Locks [address] [place] [phone]    Self-Help/Support Groups Organization         Address  Phone             Notes  [facility] [place] - variety of support groups  336- I7437963 Call for more information  Narcotics Anonymous (NA), [facility] [address], [employer] [place]  2 meetings at this location   Statistician  Address  Phone  Notes  [facility] [address]    [place]  [phone]   [facility]  [address] [place] [address], [place], [place] [phone]   [facility] [address] [place], [place] [phone] Admissions: 8am-3pm M-F  [facility] [address]    [place], [place] [phone]   [facility] [address] [place], [place], [place] [phone]   [facility] [address],  [place], [place] [phone]   [facility] - Intensive Outpatient [address] Laurell Josephs [address], [place], [place] [phone]   [facility] ([facility] [address] [place]  [place], [place] [phone] or [phone]   [facility] ([facility]) [address] [place], [place] [phone] Accepts Medicaid  [facility] [address]  [place] [phone] Substance Abuse/Addiction Treatment   [facility] Organization         Address  Phone  Notes  [facility]  [phone]   Angie Fava, PhD [address] Ervin Knack [place], [place]   [phone] or [phone]   [facility] Behavioral   [address] [place], [place] [phone]   [facility] [address] [place], [place] [phone] Insurance/Medicaid/sponsorship through [facility] and Families [address]                                    [place], [place] [phone] Therapy/tele-psych/case  [facility] [address], [place] [phone]    Dr. Lolly Mustache  [phone]   [facility] [place]  [employer] [facility] Dept. 1) [address], [place] 2) [address] [place] 3)  [address], [place] [phone]  [phone]   [facility] Child Abuse Hotline (669)596-0094 or 970-502-5782 (After Hours)

## 2013-11-21 NOTE — ED Provider Notes (Signed)
History of Present Illness   Patient Identification Sue Graves is a 18 y.o. female.  Patient information was obtained from patient and past medical records. History/Exam limitations: none. Patient presented to the Emergency Department by private vehicle.  Chief Complaint  Abdominal Pain   Patient presents for evaluation of abdominal pain. Onset of symptoms was gradual starting 1 week ago with unchanged course since that time. The pain is located RLQ,LLQ,suprapubic without radiation. The pain is rated as moderate. The pain is made worse by position (lying on her stomach) and is relieved by nothing.. The patient denies diarrhea, nausea, vomiting, fever, chills, vaginal or urinary sxs. The past workup has included acute abdominal xray and labs.   Home care consisted of nothing tried. The patient states she is celibate at this time and has not had intercourse in several months.She has not made a bowel movement in 4 days. She states this is normal for her.  Past Medical History  Diagnosis Date  . Asthma    History reviewed. No pertinent family history.[phone]) No current facility-administered medications for this encounter.   Current Outpatient Prescriptions  Medication Sig Dispense Refill  . albuterol (PROVENTIL HFA;VENTOLIN HFA) 108 (90 BASE) MCG/ACT inhaler Inhale 2 puffs into the lungs every 4 (four) hours as needed for wheezing or shortness of breath.       . metroNIDAZOLE (FLAGYL) 500 MG tablet Take 1 tablet (500 mg total) by mouth 2 (two) times daily.  14 tablet  0   Allergies  Allergen Reactions  . Tomato Hives and Swelling   History   Social History  . Marital Status: Single    Spouse Name: N/A    Number of Children: N/A  . Years of Education: N/A   Occupational History  . Not on file.   Social History Main Topics  . Smoking status: Current Every Day Smoker -- 0.50 packs/day    Types: Cigarettes  . Smokeless tobacco: Not on file  . Alcohol Use: Yes     Comment:  occasionally   . Drug Use: Yes    Special: Marijuana  . Sexual Activity: Yes    Birth Control/ Protection: None   Other Topics Concern  . Not on file   Social History Narrative  . No narrative on file   Review of Systems Ten systems reviewed and are negative for acute change, except as noted in the HPI.    Physical Exam   BP 120/61  Pulse 72  Temp(Src) 97.9 F (36.6 C) (Oral)  Resp 18  Ht 4\' 11"  (1.499 m)  SpO2 100%  LMP 10/17/2013 Physical Exam  Nursing note and vitals reviewed. Constitutional: She is oriented to person, place, and time. She appears well-developed and well-nourished. No distress.  HENT:  Head: Normocephalic and atraumatic.  Eyes: Conjunctivae normal and EOM are normal. Pupils are equal, round, and reactive to light. No scleral icterus.  Neck: Normal range of motion.  Cardiovascular: Normal rate, regular rhythm and normal heart sounds.  Exam reveals no gallop and no friction rub.   No murmur heard. Pulmonary/Chest: Effort normal and breath sounds normal. No respiratory distress.  Abdominal: Soft. Bowel sounds are normal. She exhibits no distension and no mass. There is no tenderness. There is no guarding.  Neurological: She is alert and oriented to person, place, and time.  Skin: Skin is warm and dry. She is not diaphoretic.  Pelvic exam: normal external genitalia, vulva, vagina, cervix, uterus and adnexa.    ED Course   Studies:  Results for orders placed during the hospital encounter of 11/21/13  GC/CHLAMYDIA PROBE AMP      Result Value Ref Range   CT Probe RNA NEGATIVE  NEGATIVE   GC Probe RNA NEGATIVE  NEGATIVE  WET PREP, GENITAL      Result Value Ref Range   Yeast Wet Prep HPF POC NONE SEEN  NONE SEEN   Trich, Wet Prep NONE SEEN  NONE SEEN   Clue Cells Wet Prep HPF POC NONE SEEN  NONE SEEN   WBC, Wet Prep HPF POC NONE SEEN  NONE SEEN  URINALYSIS, ROUTINE W REFLEX MICROSCOPIC      Result Value Ref Range   Color, Urine YELLOW  YELLOW    APPearance CLEAR  CLEAR   Specific Gravity, Urine 1.027  1.005 - 1.030   pH 6.5  5.0 - 8.0   Glucose, UA NEGATIVE  NEGATIVE mg/dL   Hgb urine dipstick SMALL (*) NEGATIVE   Bilirubin Urine NEGATIVE  NEGATIVE   Ketones, ur NEGATIVE  NEGATIVE mg/dL   Protein, ur NEGATIVE  NEGATIVE mg/dL   Urobilinogen, UA 1.0  0.0 - 1.0 mg/dL   Nitrite NEGATIVE  NEGATIVE   Leukocytes, UA NEGATIVE  NEGATIVE  PREGNANCY, URINE      Result Value Ref Range   Preg Test, Ur NEGATIVE  NEGATIVE  CBC WITH DIFFERENTIAL      Result Value Ref Range   WBC 6.2  4.0 - 10.5 K/uL   RBC 4.18  3.87 - 5.11 MIL/uL   Hemoglobin 13.6  12.0 - 15.0 g/dL   HCT 81.1  91.4 - 78.2 %   MCV 92.6  78.0 - 100.0 fL   MCH 32.5  26.0 - 34.0 pg   MCHC 35.1  30.0 - 36.0 g/dL   RDW 95.6  21.3 - 08.6 %   Platelets 241  150 - 400 K/uL   Neutrophils Relative % 66  43 - 77 %   Neutro Abs 4.1  1.7 - 7.7 K/uL   Lymphocytes Relative 28  12 - 46 %   Lymphs Abs 1.8  0.7 - 4.0 K/uL   Monocytes Relative 5  3 - 12 %   Monocytes Absolute 0.3  0.1 - 1.0 K/uL   Eosinophils Relative 1  0 - 5 %   Eosinophils Absolute 0.1  0.0 - 0.7 K/uL   Basophils Relative 0  0 - 1 %   Basophils Absolute 0.0  0.0 - 0.1 K/uL  COMPREHENSIVE METABOLIC PANEL      Result Value Ref Range   Sodium 140  137 - 147 mEq/L   Potassium 3.3 (*) 3.7 - 5.3 mEq/L   Chloride 102  96 - 112 mEq/L   CO2 22  19 - 32 mEq/L   Glucose, Bld 87  70 - 99 mg/dL   BUN 10  6 - 23 mg/dL   Creatinine, Ser 5.78  0.50 - 1.10 mg/dL   Calcium 9.6  8.4 - 46.9 mg/dL   Total Protein 7.5  6.0 - 8.3 g/dL   Albumin 4.3  3.5 - 5.2 g/dL   AST 19  0 - 37 U/L   ALT 12  0 - 35 U/L   Alkaline Phosphatase 80  39 - 117 U/L   Total Bilirubin 0.4  0.3 - 1.2 mg/dL   GFR calc non Af Amer >90  >90 mL/min   GFR calc Af Amer >90  >90 mL/min  URINE MICROSCOPIC-ADD ON      Result Value Ref  Range   Squamous Epithelial / LPF FEW (*) RARE   RBC / HPF 0-2  <3 RBC/hpf   Urine-Other MUCOUS PRESENT       Records  Reviewed: Old medical records.  Treatments: None  Consultations: None  Disposition: Home: plan to use miralax   Final diagnoses:  Abdominal pain  Constipation   MDM Number of Diagnoses or Management Options Abdominal pain:  Constipation:  Diagnosis management comments: Patient with abdominal pain. No focal tenderness . Labs reviewed and only shows minor hypokalemia which appears to be patinet's baseline.  Small amount of hgb on UA. Patient began her period today. Pelvic exam is unremarkable. Labs ohterwise wnl.  Patient is nontoxic, nonseptic appearing, in no apparent distress.  Patient's pain and other symptoms adequately managed in emergency department.  Fluid bolus given.  Labs, imaging and vitals reviewed.  Patient does not meet the SIRS or Sepsis criteria.  On repeat exam patient does not have a surgical abdomin and there are nor peritoneal signs.  No indication of appendicitis, bowel obstruction, bowel perforation, cholecystitis, diverticulitis, PID or ectopic pregnancy.  Patient discharged home with symptomatic treatment and given strict instructions for follow-up with their primary care physician.  I have also discussed reasons to return immediately to the ER.  Patient expresses understanding and agrees with plan.        Arthor CaptainAbigail Galen Russman, PA-C 11/23/13 [phone]

## 2013-11-22 LAB — GC/CHLAMYDIA PROBE AMP
CT PROBE, AMP APTIMA: NEGATIVE
GC PROBE AMP APTIMA: NEGATIVE

## 2013-11-24 NOTE — ED Provider Notes (Signed)
Medical screening examination/treatment/procedure(s) were performed by non-physician practitioner and as supervising physician I was immediately available for consultation/collaboration.   EKG Interpretation None        Salley Boxley B. Bernette MayersSheldon, MD 11/24/13 [phone]

## 2014-07-25 ENCOUNTER — Emergency Department (HOSPITAL_COMMUNITY)
Admission: EM | Admit: 2014-07-25 | Discharge: 2014-07-25 | Disposition: A | Discharge status: Home / Self Care | Payer: Medicaid Other | Attending: Emergency Medicine | Admitting: Emergency Medicine

## 2014-07-25 ENCOUNTER — Encounter (HOSPITAL_COMMUNITY): Payer: Self-pay | Admitting: Emergency Medicine

## 2014-07-25 DIAGNOSIS — J45909 Unspecified asthma, uncomplicated: Secondary | ICD-10-CM | POA: Insufficient documentation

## 2014-07-25 DIAGNOSIS — Z720 Tobacco use: Secondary | ICD-10-CM | POA: Insufficient documentation

## 2014-07-25 DIAGNOSIS — Z3202 Encounter for pregnancy test, result negative: Secondary | ICD-10-CM | POA: Insufficient documentation

## 2014-07-25 DIAGNOSIS — R1030 Lower abdominal pain, unspecified: Secondary | ICD-10-CM | POA: Diagnosis present

## 2014-07-25 DIAGNOSIS — Z79899 Other long term (current) drug therapy: Secondary | ICD-10-CM | POA: Diagnosis not present

## 2014-07-25 DIAGNOSIS — A5901 Trichomonal vulvovaginitis: Secondary | ICD-10-CM | POA: Diagnosis not present

## 2014-07-25 LAB — URINALYSIS, ROUTINE W REFLEX MICROSCOPIC
BILIRUBIN URINE: NEGATIVE
Glucose, UA: NEGATIVE mg/dL
Hgb urine dipstick: NEGATIVE
Ketones, ur: 15 mg/dL — AB
NITRITE: NEGATIVE
PH: 6 (ref 5.0–8.0)
Protein, ur: 30 mg/dL — AB
Specific Gravity, Urine: 1.028 (ref 1.005–1.030)
Urobilinogen, UA: 0.2 mg/dL (ref 0.0–1.0)

## 2014-07-25 LAB — URINE MICROSCOPIC-ADD ON

## 2014-07-25 LAB — WET PREP, GENITAL
Clue Cells Wet Prep HPF POC: NONE SEEN
Yeast Wet Prep HPF POC: NONE SEEN

## 2014-07-25 LAB — POC URINE PREG, ED: Preg Test, Ur: NEGATIVE

## 2014-07-25 MED ORDER — AZITHROMYCIN 250 MG PO TABS
1000.0000 mg | ORAL_TABLET | Freq: Once | ORAL | Status: AC
  Administered 2014-07-25: 1000 mg via ORAL
  Filled 2014-07-25: qty 4

## 2014-07-25 MED ORDER — CEFTRIAXONE SODIUM 250 MG IJ SOLR
250.0000 mg | Freq: Once | INTRAMUSCULAR | Status: AC
  Administered 2014-07-25: 250 mg via INTRAMUSCULAR
  Filled 2014-07-25: qty 250

## 2014-07-25 MED ORDER — STERILE WATER FOR INJECTION IJ SOLN
INTRAMUSCULAR | Status: AC
  Administered 2014-07-25: 0.9 mL
  Filled 2014-07-25: qty 10

## 2014-07-25 MED ORDER — METRONIDAZOLE 500 MG PO TABS: 500.0000 mg | ORAL_TABLET | Freq: Two times a day (BID) | ORAL | Status: DC

## 2014-07-25 NOTE — ED Notes (Signed)
Pt c/o lower abdominal pain x 1 week with nausea.

## 2014-07-25 NOTE — Discharge Instructions (Signed)
Take flagyl twice daily for 7 days as directed. Avoid alcohol with this antibiotic. Your partner needs to be informed of trichomonas for treatment. You were treated today for both gonorrhea and chlamydia. If these tests result positive, you will be contacted and are then obligated to inform your partner for treatment.  Trichomoniasis Trichomoniasis is an infection caused by an organism called Trichomonas. The infection can affect both women and men. In women, the outer female genitalia and the vagina are affected. In men, the penis is mainly affected, but the prostate and other reproductive organs can also be involved. Trichomoniasis is a sexually transmitted infection (STI) and is most often passed to another person through sexual contact.  RISK FACTORS  Having unprotected sexual intercourse.  Having sexual intercourse with an infected partner. SIGNS AND SYMPTOMS  Symptoms of trichomoniasis in women include:  Abnormal gray-green frothy vaginal discharge.  Itching and irritation of the vagina.  Itching and irritation of the area outside the vagina. Symptoms of trichomoniasis in men include:   Penile discharge with or without pain.  Pain during urination. This results from inflammation of the urethra. DIAGNOSIS  Trichomoniasis may be found during a Pap test or physical exam. Your health care provider may use one of the following methods to help diagnose this infection:  Examining vaginal discharge under a microscope. For men, urethral discharge would be examined.  Testing the pH of the vagina with a test tape.  Using a vaginal swab test that checks for the Trichomonas organism. A test is available that provides results within a few minutes.  Doing a culture test for the organism. This is not usually needed. TREATMENT   You may be given medicine to fight the infection. Women should inform their health care provider if they could be or are pregnant. Some medicines used to treat the  infection should not be taken during pregnancy.  Your health care provider may recommend over-the-counter medicines or creams to decrease itching or irritation.  Your sexual partner will need to be treated if infected. HOME CARE INSTRUCTIONS   Take medicines only as directed by your health care provider.  Take over-the-counter medicine for itching or irritation as directed by your health care provider.  Do not have sexual intercourse while you have the infection.  Women should not douche or wear tampons while they have the infection.  Discuss your infection with your partner. Your partner may have gotten the infection from you, or you may have gotten it from your partner.  Have your sex partner get examined and treated if necessary.  Practice safe, informed, and protected sex.  See your health care provider for other STI testing. SEEK MEDICAL CARE IF:   You still have symptoms after you finish your medicine.  You develop abdominal pain.  You have pain when you urinate.  You have bleeding after sexual intercourse.  You develop a rash.  Your medicine makes you sick or makes you throw up (vomit). MAKE SURE YOU:  Understand these instructions.  Will watch your condition.  Will get help right away if you are not doing well or get worse. Document Released: 01/29/2001 Document Revised: 12/20/2013 Document Reviewed: 05/17/2013 [facility] Patient Information 2015 [place], [place]. This information is not intended to replace advice given to you by your health care provider. Make sure you discuss any questions you have with your health care provider.  Sexually Transmitted Disease A sexually transmitted disease (STD) is a disease or infection that may be passed (transmitted) from person  to person, usually during sexual activity. This may happen by way of saliva, semen, blood, vaginal mucus, or urine. Common STDs include:   Gonorrhea.   Chlamydia.   Syphilis.   HIV and AIDS.    Genital herpes.   Hepatitis B and C.   Trichomonas.   Human papillomavirus (HPV).   Pubic lice.   Scabies.  Mites.  Bacterial vaginosis. WHAT ARE CAUSES OF STDs? An STD may be caused by bacteria, a virus, or parasites. STDs are often transmitted during sexual activity if one person is infected. However, they may also be transmitted through nonsexual means. STDs may be transmitted after:   Sexual intercourse with an infected person.   Sharing sex toys with an infected person.   Sharing needles with an infected person or using unclean piercing or tattoo needles.  Having intimate contact with the genitals, mouth, or rectal areas of an infected person.   Exposure to infected fluids during birth. WHAT ARE THE SIGNS AND SYMPTOMS OF STDs? Different STDs have different symptoms. Some people may not have any symptoms. If symptoms are present, they may include:   Painful or bloody urination.   Pain in the pelvis, abdomen, vagina, anus, throat, or eyes.   A skin rash, itching, or irritation.  Growths, ulcerations, blisters, or sores in the genital and anal areas.  Abnormal vaginal discharge with or without bad odor.   Penile discharge in men.   Fever.   Pain or bleeding during sexual intercourse.   Swollen glands in the groin area.   Yellow skin and eyes (jaundice). This is seen with hepatitis.   Swollen testicles.  Infertility.  Sores and blisters in the mouth. HOW ARE STDs DIAGNOSED? To make a diagnosis, your health care provider may:   Take a medical history.   Perform a physical exam.   Take a sample of any discharge to examine.  Swab the throat, cervix, opening to the penis, rectum, or vagina for testing.  Test a sample of your first morning urine.   Perform blood tests.   Perform a Pap test, if this applies.   Perform a colposcopy.   Perform a laparoscopy.  HOW ARE STDs TREATED? Treatment depends on the STD. Some STDs  may be treated but not cured.   Chlamydia, gonorrhea, trichomonas, and syphilis can be cured with antibiotic medicine.   Genital herpes, hepatitis, and HIV can be treated, but not cured, with prescribed medicines. The medicines lessen symptoms.   Genital warts from HPV can be treated with medicine or by freezing, burning (electrocautery), or surgery. Warts may come back.   HPV cannot be cured with medicine or surgery. However, abnormal areas may be removed from the cervix, vagina, or vulva.   If your diagnosis is confirmed, your recent sexual partners need treatment. This is true even if they are symptom-free or have a negative culture or evaluation. They should not have sex until their health care providers say it is okay. HOW CAN I REDUCE MY RISK OF GETTING AN STD? Take these steps to reduce your risk of getting an STD:  Use latex condoms, dental dams, and water-soluble lubricants during sexual activity. Do not use petroleum jelly or oils.  Avoid having multiple sex partners.  Do not have sex with someone who has other sex partners.  Do not have sex with anyone you do not know or who is at high risk for an STD.  Avoid risky sex practices that can break your skin.  Do not have  sex if you have open sores on your mouth or skin.  Avoid drinking too much alcohol or taking illegal drugs. Alcohol and drugs can affect your judgment and put you in a vulnerable position.  Avoid engaging in oral and anal sex acts.  Get vaccinated for HPV and hepatitis. If you have not received these vaccines in the past, talk to your health care provider about whether one or both might be right for you.   If you are at risk of being infected with HIV, it is recommended that you take a prescription medicine daily to prevent HIV infection. This is called pre-exposure prophylaxis (PrEP). You are considered at risk if:  You are a man who has sex with other men (MSM).  You are a heterosexual man or woman  and are sexually active with more than one partner.  You take drugs by injection.  You are sexually active with a partner who has HIV.  Talk with your health care provider about whether you are at high risk of being infected with HIV. If you choose to begin PrEP, you should first be tested for HIV. You should then be tested every 3 months for as long as you are taking PrEP.  WHAT SHOULD I DO IF I THINK I HAVE AN STD?  See your health care provider.   Tell your sexual partner(s). They should be tested and treated for any STDs.  Do not have sex until your health care provider says it is okay. WHEN SHOULD I GET IMMEDIATE MEDICAL CARE? Contact your health care provider right away if:   You have severe abdominal pain.  You are a man and notice swelling or pain in your testicles.  You are a woman and notice swelling or pain in your vagina. Document Released: 10/26/2002 Document Revised: 08/10/2013 Document Reviewed: 02/23/2013 [facility]ExitCare Patient Information 2015 [place], [place]. This information is not intended to replace advice given to you by your health care provider. Make sure you discuss any questions you have with your health care provider.

## 2014-07-25 NOTE — ED Provider Notes (Signed)
CSN: [id number]     Arrival date & time 07/25/14  [id number] History   First MD Initiated Contact with Patient 07/25/14 [id number]     Chief Complaint  Patient presents with  . Abdominal Pain     (Consider location/radiation/quality/duration/timing/severity/associated sxs/prior Treatment) HPI Comments: 18 year old female presenting with intermittent lower abdominal pain 1 week. Patient describes the pain as a "bubble" radiating across her lower abdomen that is coming going at random. Admits to associated nausea without vomiting. States she has been taking over-the-counter pain reliever with temporary relief of her symptoms. Currently asymptomatic. Denies vaginal discharge, bleeding, increased urinary frequency, urgency, dysuria, diarrhea. Last menstrual period began November 27 and was normal. States she has not been sexually active for the past 2 months. Admits to history of gonorrhea. Admits to chills without fever. No history of abdominal surgeries.  Patient is a 18 y.o. female presenting with abdominal pain. The history is provided by the patient.  Abdominal Pain   Past Medical History  Diagnosis Date  . Asthma    History reviewed. No pertinent past surgical history. No family history on file. History  Substance Use Topics  . Smoking status: Current Every Day Smoker -- 0.50 packs/day    Types: Cigarettes  . Smokeless tobacco: Not on file  . Alcohol Use: Yes     Comment: occasionally    OB History    No data available     Review of Systems  Gastrointestinal: Positive for abdominal pain.    10 Systems reviewed and are negative for acute change except as noted in the HPI.  Allergies  Tomato  Home Medications   Prior to Admission medications   Medication Sig Start Date End Date Taking? Authorizing Provider  albuterol (PROVENTIL HFA;VENTOLIN HFA) 108 (90 BASE) MCG/ACT inhaler Inhale 2 puffs into the lungs every 4 (four) hours as needed for wheezing or shortness of breath.    Yes  Historical Provider, MD  metroNIDAZOLE (FLAGYL) 500 MG tablet Take 1 tablet (500 mg total) by mouth 2 (two) times daily. One po bid x 7 days 07/25/14   Kathrynn Speedobyn M Tattianna Schnarr, PA-C  polyethylene glycol powder (GLYCOLAX/MIRALAX) powder Place 5 capfuls in 32 ounces of water and drink the mixture. Use one capful in 8 ounces of water daily thereafter. Patient not taking: Reported on 07/25/2014 11/21/13   Arthor CaptainAbigail Harris, PA-C   BP 115/71 mmHg  Pulse 63  Temp(Src) 98.1 F (36.7 C) (Oral)  Resp 21  Ht 5' (1.524 m)  Wt 100 lb (45.36 kg)  BMI 19.53 kg/m2  SpO2 100%  LMP 07/15/2014 Physical Exam  Constitutional: She is oriented to person, place, and time. She appears well-developed and well-nourished. No distress.  HENT:  Head: Normocephalic and atraumatic.  Mouth/Throat: Oropharynx is clear and moist.  Eyes: Conjunctivae and EOM are normal.  Neck: Normal range of motion. Neck supple.  Cardiovascular: Normal rate, regular rhythm and normal heart sounds.   Pulmonary/Chest: Effort normal and breath sounds normal. No respiratory distress.  Abdominal: Soft. Bowel sounds are normal. She exhibits no distension and no mass. There is no tenderness. There is no rebound and no guarding.  Genitourinary: Uterus normal. Cervix exhibits no motion tenderness, no discharge and no friability. Right adnexum displays no tenderness. Left adnexum displays no tenderness. No erythema, tenderness or bleeding in the vagina. Vaginal discharge (white/yellow, malodorous) found.  Musculoskeletal: Normal range of motion. She exhibits no edema.  Neurological: She is alert and oriented to person, place, and time. No sensory deficit.  Skin: Skin is warm and dry. She is not diaphoretic.  Psychiatric: She has a normal mood and affect. Her behavior is normal.  Nursing note and vitals reviewed.   ED Course  Procedures (including critical care time) Labs Review Labs Reviewed  WET PREP, GENITAL - Abnormal; Notable for the following:     Trich, Wet Prep TOO NUMEROUS TO COUNT (*)    WBC, Wet Prep HPF POC FEW (*)    All other components within normal limits  URINALYSIS, ROUTINE W REFLEX MICROSCOPIC - Abnormal; Notable for the following:    Ketones, ur 15 (*)    Protein, ur 30 (*)    Leukocytes, UA TRACE (*)    All other components within normal limits  URINE MICROSCOPIC-ADD ON - Abnormal; Notable for the following:    Squamous Epithelial / LPF FEW (*)    Bacteria, UA FEW (*)    All other components within normal limits  GC/CHLAMYDIA PROBE AMP  POC URINE PREG, ED    Imaging Review No results found.   EKG Interpretation None      MDM   Final diagnoses:  Trichomonal vaginitis   Pt in NAD. AFVSS. Abdomen soft and non-tender. Malodorous vaginal d/c. No CMT or adnexal tenderness. Doubt PID. Wet prep with too numerous to count trichomonas. Discussed treatment also for GC/chlamydia, patient agreeable. Will discharge with Flagyl. States sexual practices discussed. Stable for discharge. Return precautions given. Patient states understanding of treatment care plan and is agreeable.   Kathrynn SpeedRobyn M Cylis Ayars, PA-C 07/25/14 1027  Arby BarretteMarcy Pfeiffer, MD 07/26/14 [id number]

## 2014-07-26 LAB — GC/CHLAMYDIA PROBE AMP
CT PROBE, AMP APTIMA: NEGATIVE
GC Probe RNA: NEGATIVE

## 2014-08-01 ENCOUNTER — Telehealth (HOSPITAL_COMMUNITY): Payer: Self-pay

## 2015-10-11 ENCOUNTER — Emergency Department (HOSPITAL_COMMUNITY)
Admission: EM | Admit: 2015-10-11 | Discharge: 2015-10-11 | Disposition: A | Discharge status: Home / Self Care | Payer: Medicaid Other | Attending: Emergency Medicine | Admitting: Emergency Medicine

## 2015-10-11 ENCOUNTER — Encounter (HOSPITAL_COMMUNITY): Payer: Self-pay | Admitting: *Deleted

## 2015-10-11 DIAGNOSIS — J45909 Unspecified asthma, uncomplicated: Secondary | ICD-10-CM | POA: Insufficient documentation

## 2015-10-11 DIAGNOSIS — Z3202 Encounter for pregnancy test, result negative: Secondary | ICD-10-CM | POA: Insufficient documentation

## 2015-10-11 DIAGNOSIS — M546 Pain in thoracic spine: Secondary | ICD-10-CM | POA: Insufficient documentation

## 2015-10-11 DIAGNOSIS — F17210 Nicotine dependence, cigarettes, uncomplicated: Secondary | ICD-10-CM | POA: Insufficient documentation

## 2015-10-11 DIAGNOSIS — M545 Low back pain, unspecified: Secondary | ICD-10-CM

## 2015-10-11 DIAGNOSIS — Z79899 Other long term (current) drug therapy: Secondary | ICD-10-CM | POA: Insufficient documentation

## 2015-10-11 LAB — URINALYSIS, ROUTINE W REFLEX MICROSCOPIC
BILIRUBIN URINE: NEGATIVE
Glucose, UA: NEGATIVE mg/dL
Hgb urine dipstick: NEGATIVE
Ketones, ur: NEGATIVE mg/dL
LEUKOCYTES UA: NEGATIVE
Nitrite: NEGATIVE
Protein, ur: NEGATIVE mg/dL
SPECIFIC GRAVITY, URINE: 1.019 (ref 1.005–1.030)
pH: 7 (ref 5.0–8.0)

## 2015-10-11 LAB — POC URINE PREG, ED: PREG TEST UR: NEGATIVE

## 2015-10-11 MED ORDER — IBUPROFEN 800 MG PO TABS: 800.0000 mg | ORAL_TABLET | Freq: Three times a day (TID) | ORAL | Status: DC

## 2015-10-11 MED ORDER — CYCLOBENZAPRINE HCL 10 MG PO TABS
10.0000 mg | ORAL_TABLET | Freq: Once | ORAL | Status: AC
  Administered 2015-10-11: 10 mg via ORAL
  Filled 2015-10-11: qty 1

## 2015-10-11 MED ORDER — TRAMADOL HCL 50 MG PO TABS
50.0000 mg | ORAL_TABLET | Freq: Once | ORAL | Status: AC
  Administered 2015-10-11: 50 mg via ORAL
  Filled 2015-10-11: qty 1

## 2015-10-11 MED ORDER — CYCLOBENZAPRINE HCL 10 MG PO TABS: 10.0000 mg | ORAL_TABLET | Freq: Two times a day (BID) | ORAL | Status: DC | PRN

## 2015-10-11 MED ORDER — TRAMADOL HCL 50 MG PO TABS
50.0000 mg | ORAL_TABLET | Freq: Two times a day (BID) | ORAL | Status: DC | PRN
Start: 2015-10-11 — End: 2016-02-02

## 2015-10-11 NOTE — ED Notes (Signed)
Pt placed into gown 

## 2015-10-11 NOTE — ED Notes (Signed)
C/O BACK PAIN ONSET 1 MONTH AGO NO HISTORY OF INJURY

## 2015-10-11 NOTE — Discharge Instructions (Signed)

## 2015-10-11 NOTE — ED Provider Notes (Signed)
CSN: [id number]     Arrival date & time 10/11/15  0818 History  By signing my name below, I, Tanda Rockers, attest that this documentation has been prepared under the direction and in the presence of Danelle Berry, PA-C. Electronically Signed: Tanda Rockers, ED Scribe. 10/11/2015. 9:40 AM.   Chief Complaint  Patient presents with  . Back Pain   The history is provided by the patient. No language interpreter was used.     HPI Comments: Sue Graves is a 20 y.o. female who presents to the Emergency Department complaining of gradual onset, constant, unchanged, 7/10, non-radiating lower back pain x 1 month. No known injury, trauma, fall, or strenuous activity that could have caused the back pain. The pain is exacerbated with movement and bending. Pt denies dysuria and hematuria but does complain of vague, intermittent, "hot" urine. No previous injury to back. No hx chronic steroid use, cancer, or IVDA. Denies fever, sweats, chills, change in color or urine, numbness, weakness, tingling, urinary or bowel incontinence, saddle anesthesia, vaginal discharge, vaginal odor, vaginal irritation, abdominal pain, nausea, vomiting, or any other associated symptoms.  She feels "worn out" from dealing with her pain back pain.  Pt reports that her last menstrual period was 10/08/2015 (approximatley 4 days ago) which was 10 days earlier than her usual menstrual period. She mentions that the period did not last very long and she would like a pregnancy test while in the ED today. Pt last had protected intercourse on 09/30/2015 (approximatley 11 days ago).   Past Medical History  Diagnosis Date  . Asthma    History reviewed. No pertinent past surgical history. History reviewed. No pertinent family history. Social History  Substance Use Topics  . Smoking status: Current Every Day Smoker -- 0.50 packs/day    Types: Cigarettes  . Smokeless tobacco: None  . Alcohol Use: Yes     Comment: occasionally    OB History     No data available     Review of Systems  Constitutional: Negative.  Negative for fever, chills and diaphoresis.  HENT: Negative.   Respiratory: Negative.   Gastrointestinal: Negative.  Negative for nausea, vomiting, abdominal pain, diarrhea and constipation.       Negative for bowel incontinence  Genitourinary: Negative.  Negative for dysuria, hematuria and vaginal discharge.       Negative for urinary incontinence  Musculoskeletal: Positive for back pain. Negative for myalgias, joint swelling, gait problem and neck pain.  Skin: Negative.   Neurological: Negative.  Negative for weakness, light-headedness and numbness.       Negative for saddle anesthesia  All other systems reviewed and are negative.  Allergies  Tomato  Home Medications   Prior to Admission medications   Medication Sig Start Date End Date Taking? Authorizing Provider  albuterol (PROVENTIL HFA;VENTOLIN HFA) 108 (90 BASE) MCG/ACT inhaler Inhale 2 puffs into the lungs every 4 (four) hours as needed for wheezing or shortness of breath.     Historical Provider, MD  cyclobenzaprine (FLEXERIL) 10 MG tablet Take 1 tablet (10 mg total) by mouth 2 (two) times daily as needed for muscle spasms. 10/11/15   Danelle Berry, PA-C  ibuprofen (ADVIL,MOTRIN) 800 MG tablet Take 1 tablet (800 mg total) by mouth 3 (three) times daily. 10/11/15   Danelle Berry, PA-C  metroNIDAZOLE (FLAGYL) 500 MG tablet Take 1 tablet (500 mg total) by mouth 2 (two) times daily. One po bid x 7 days 07/25/14   Kathrynn Speed, PA-C  polyethylene glycol  powder (GLYCOLAX/MIRALAX) powder Place 5 capfuls in 32 ounces of water and drink the mixture. Use one capful in 8 ounces of water daily thereafter. Patient not taking: Reported on 07/25/2014 11/21/13   Arthor Captain, PA-C  traMADol (ULTRAM) 50 MG tablet Take 1 tablet (50 mg total) by mouth every 12 (twelve) hours as needed for severe pain. 10/11/15   Danelle Berry, PA-C   BP 133/76 mmHg  Pulse 103  Temp(Src) 98 F (36.7  C) (Oral)  Resp 19  SpO2 99%   Physical Exam  Constitutional: She is oriented to person, place, and time. She appears well-developed and well-nourished. No distress.  HENT:  Head: Normocephalic and atraumatic.  Nose: Nose normal.  Mouth/Throat: Oropharynx is clear and moist. No oropharyngeal exudate.  Eyes: Conjunctivae and EOM are normal. Pupils are equal, round, and reactive to light. Right eye exhibits no discharge. Left eye exhibits no discharge. No scleral icterus.  Neck: Normal range of motion. Neck supple. No JVD present. No tracheal deviation present. No thyromegaly present.  Cardiovascular: Normal rate, regular rhythm, normal heart sounds and intact distal pulses.  Exam reveals no gallop and no friction rub.   No murmur heard. Pulmonary/Chest: Effort normal and breath sounds normal. No respiratory distress. She has no wheezes. She has no rales. She exhibits no tenderness.  Abdominal: Soft. Bowel sounds are normal. She exhibits no distension and no mass. There is no tenderness. There is no rebound, no guarding and no CVA tenderness.  Musculoskeletal: Normal range of motion. She exhibits tenderness. She exhibits no edema.  Right Low thoracic to lumbar paraspinal tenderness No midline tenderness No step offs Negative straight leg raise Normal sensation to light touch to BLEs Normal DP pulses bilaterally Normal strength 5/5 to dorsiflexion and plantarflexion Normal gait  Lymphadenopathy:    She has no cervical adenopathy.  Neurological: She is alert and oriented to person, place, and time. She has normal reflexes. No cranial nerve deficit. She exhibits normal muscle tone. Coordination normal.  Skin: Skin is warm and dry. No rash noted. She is not diaphoretic. No erythema. No pallor.  Psychiatric: She has a normal mood and affect. Her behavior is normal. Judgment and thought content normal.  Nursing note and vitals reviewed.   ED Course  Procedures (including critical care  time)  DIAGNOSTIC STUDIES: Oxygen Saturation is 100% on RA, normal by my interpretation.    COORDINATION OF CARE: 9:40 AM-Discussed treatment plan which includes urine pregnancy and UA with pt at bedside and pt agreed to plan.   Labs Review Labs Reviewed  URINALYSIS, ROUTINE W REFLEX MICROSCOPIC (NOT AT [facility])  POC URINE PREG, ED    Imaging Review No results found. I have personally reviewed and evaluated these lab results as part of my medical decision-making.   EKG Interpretation None      MDM   Patient with back pain.  No neurological deficits and normal neuro exam.  Patient can walk but states is painful.  No loss of bowel or bladder control.  No concern for cauda equina.  No fever, night sweats, weight loss, h/o cancer, IVDU.  Pt denied urinary sx.  UA negative for UTI/pyelo.  Pt requested pregnancy test, which was negative.  Consistent with muscle strain RICE protocol and muscle relaxer medicine indicated and discussed with patient. Return precautions reviewed.  Pt D/C in good condition, ambulatory with steady gait.   Final diagnoses:  Bilateral low back pain without sciatica   I personally performed the services described in this  documentation, which was scribed in my presence. The recorded information has been reviewed and is accurate.        Danelle Berry, PA-C 10/11/15 2159  Eber Hong, MD 10/12/15 [phone]

## 2015-10-11 NOTE — ED Notes (Signed)
Pt given specimen cup. 

## 2016-01-31 ENCOUNTER — Emergency Department (HOSPITAL_COMMUNITY): Payer: Self-pay

## 2016-01-31 ENCOUNTER — Emergency Department (HOSPITAL_COMMUNITY)
Admission: EM | Admit: 2016-01-31 | Discharge: 2016-01-31 | Disposition: A | Discharge status: Home / Self Care | Payer: Self-pay | Attending: Emergency Medicine | Admitting: Emergency Medicine

## 2016-01-31 ENCOUNTER — Encounter (HOSPITAL_COMMUNITY): Payer: Self-pay | Admitting: Family Medicine

## 2016-01-31 DIAGNOSIS — K5900 Constipation, unspecified: Secondary | ICD-10-CM | POA: Insufficient documentation

## 2016-01-31 DIAGNOSIS — R102 Pelvic and perineal pain: Secondary | ICD-10-CM | POA: Insufficient documentation

## 2016-01-31 DIAGNOSIS — Z79899 Other long term (current) drug therapy: Secondary | ICD-10-CM | POA: Insufficient documentation

## 2016-01-31 DIAGNOSIS — R109 Unspecified abdominal pain: Secondary | ICD-10-CM

## 2016-01-31 DIAGNOSIS — E349 Endocrine disorder, unspecified: Secondary | ICD-10-CM

## 2016-01-31 DIAGNOSIS — F17210 Nicotine dependence, cigarettes, uncomplicated: Secondary | ICD-10-CM | POA: Insufficient documentation

## 2016-01-31 DIAGNOSIS — J45909 Unspecified asthma, uncomplicated: Secondary | ICD-10-CM | POA: Insufficient documentation

## 2016-01-31 LAB — COMPREHENSIVE METABOLIC PANEL
ALT: 15 U/L (ref 14–54)
AST: 23 U/L (ref 15–41)
Albumin: 4.6 g/dL (ref 3.5–5.0)
Alkaline Phosphatase: 64 U/L (ref 38–126)
Anion gap: 10 (ref 5–15)
BUN: 7 mg/dL (ref 6–20)
CO2: 21 mmol/L — ABNORMAL LOW (ref 22–32)
Calcium: 10 mg/dL (ref 8.9–10.3)
Chloride: 105 mmol/L (ref 101–111)
Creatinine, Ser: 0.72 mg/dL (ref 0.44–1.00)
GFR calc Af Amer: >60 mL/min (ref >60)
Glucose, Bld: 88 mg/dL (ref 65–99)
POTASSIUM: 3.1 mmol/L — AB (ref 3.5–5.1)
Sodium: 136 mmol/L (ref 135–145)
Total Bilirubin: 0.7 mg/dL (ref 0.3–1.2)
Total Protein: 8.1 g/dL (ref 6.5–8.1)

## 2016-01-31 LAB — CBC WITH DIFFERENTIAL/PLATELET
BASOS ABS: 0 10*3/uL (ref 0.0–0.1)
Basophils Relative: 0 %
Eosinophils Absolute: 0 10*3/uL (ref 0.0–0.7)
Eosinophils Relative: 0 %
HCT: 39.5 % (ref 36.0–46.0)
HEMOGLOBIN: 14.1 g/dL (ref 12.0–15.0)
LYMPHS PCT: 42 %
Lymphs Abs: 3.5 10*3/uL (ref 0.7–4.0)
MCH: 32.1 pg (ref 26.0–34.0)
MCHC: 35.7 g/dL (ref 30.0–36.0)
MCV: 90 fL (ref 78.0–100.0)
MONOS PCT: 5 %
Monocytes Absolute: 0.4 10*3/uL (ref 0.1–1.0)
NEUTROS ABS: 4.3 10*3/uL (ref 1.7–7.7)
Neutrophils Relative %: 53 %
Platelets: 249 10*3/uL (ref 150–400)
RBC: 4.39 MIL/uL (ref 3.87–5.11)
RDW: 12.3 % (ref 11.5–15.5)
WBC: 8.2 10*3/uL (ref 4.0–10.5)

## 2016-01-31 LAB — WET PREP, GENITAL
Clue Cells Wet Prep HPF POC: NONE SEEN
Sperm: NONE SEEN
Trich, Wet Prep: NONE SEEN
Yeast Wet Prep HPF POC: NONE SEEN

## 2016-01-31 LAB — HCG, QUANTITATIVE, PREGNANCY: HCG, BETA CHAIN, QUANT, S: 29 m[IU]/mL — AB (ref <5)

## 2016-01-31 LAB — PREGNANCY, URINE: Preg Test, Ur: POSITIVE — AB

## 2016-01-31 LAB — URINALYSIS, ROUTINE W REFLEX MICROSCOPIC
GLUCOSE, UA: NEGATIVE mg/dL
Ketones, ur: 15 mg/dL — AB
Nitrite: NEGATIVE
PH: 6.5 (ref 5.0–8.0)
Protein, ur: 100 mg/dL — AB
SPECIFIC GRAVITY, URINE: 1.027 (ref 1.005–1.030)

## 2016-01-31 LAB — URINE MICROSCOPIC-ADD ON

## 2016-01-31 MED ORDER — DOCUSATE SODIUM 100 MG PO CAPS: 100.0000 mg | ORAL_CAPSULE | Freq: Two times a day (BID) | ORAL | Status: DC

## 2016-01-31 MED ORDER — POLYETHYLENE GLYCOL 3350 17 G PO PACK: 17.0000 g | PACK | Freq: Every day | ORAL | Status: DC

## 2016-01-31 NOTE — ED Notes (Signed)
Pt here for missed period, vaginal itching, cramping and vaginal swelling. Denies bleeding or discharge.

## 2016-01-31 NOTE — ED Provider Notes (Signed)
CSN: [id number]     Arrival date & time 01/31/16  1349 History   First MD Initiated Contact with Patient 01/31/16 1723     Chief Complaint  Patient presents with  . Amenorrhea  . Abdominal Cramping     (Consider location/radiation/quality/duration/timing/severity/associated sxs/prior Treatment) Patient is a 20 y.o. female presenting with cramps.  Abdominal Cramping Associated symptoms include chest pain and abdominal pain. Pertinent negatives include no headaches and no shortness of breath.   Patient is G0 female who presents with one week of episodic general abdominal cramping. Describes the pain as sharp. She's had 2 days of nausea and then several episodes of vomiting today. She's noted for the last couple days that she's had a vulvar swelling and some itching. Denies any discharge or vaginal bleeding. Patient states she is sexually active without the use of barrier protection or oral contraceptive. States oral contraceptive "does not work". No fever or chills. Her last menstrual period was 5/12. She states she is 2 days late for her period but admits to irregular period. No diarrhea. Cannot remember last bowel movement. Denies urinary frequency, urgency or dysuria. Past Medical History  Diagnosis Date  . Asthma    History reviewed. No pertinent past surgical history. History reviewed. No pertinent family history. Social History  Substance Use Topics  . Smoking status: Current Every Day Smoker -- 0.50 packs/day    Types: Cigarettes  . Smokeless tobacco: None  . Alcohol Use: Yes     Comment: occasionally    OB History    No data available     Review of Systems  Constitutional: Negative for fever, chills and fatigue.  Respiratory: Negative for shortness of breath.   Cardiovascular: Positive for chest pain.  Gastrointestinal: Positive for nausea, vomiting, abdominal pain and constipation. Negative for diarrhea and abdominal distention.  Genitourinary: Negative for dysuria,  frequency, hematuria, flank pain, vaginal bleeding, vaginal discharge and pelvic pain.  Musculoskeletal: Negative for myalgias, back pain, neck pain and neck stiffness.  Neurological: Negative for dizziness, weakness, light-headedness, numbness and headaches.  All other systems reviewed and are negative.     Allergies  Tomato  Home Medications   Prior to Admission medications   Medication Sig Start Date End Date Taking? Authorizing Provider  albuterol (PROVENTIL HFA;VENTOLIN HFA) 108 (90 BASE) MCG/ACT inhaler Inhale 2 puffs into the lungs every 4 (four) hours as needed for wheezing or shortness of breath.    Yes Historical Provider, MD  cyclobenzaprine (FLEXERIL) 10 MG tablet Take 1 tablet (10 mg total) by mouth 2 (two) times daily as needed for muscle spasms. 10/11/15  Yes Danelle Berry, PA-C  ibuprofen (ADVIL,MOTRIN) 800 MG tablet Take 1 tablet (800 mg total) by mouth 3 (three) times daily. 10/11/15  Yes Danelle Berry, PA-C  docusate sodium (COLACE) 100 MG capsule Take 1 capsule (100 mg total) by mouth every 12 (twelve) hours. 01/31/16   Loren Racer, MD  metroNIDAZOLE (FLAGYL) 500 MG tablet Take 1 tablet (500 mg total) by mouth 2 (two) times daily. One po bid x 7 days 07/25/14   Kathrynn Speed, PA-C  polyethylene glycol [facility] / GLYCOLAX) packet Take 17 g by mouth daily. 01/31/16   Loren Racer, MD  traMADol (ULTRAM) 50 MG tablet Take 1 tablet (50 mg total) by mouth every 12 (twelve) hours as needed for severe pain. 10/11/15   Danelle Berry, PA-C   BP 128/82 mmHg  Pulse 71  Temp(Src) 99.5 F (37.5 C) (Oral)  Resp 16  SpO2 100%  LMP 12/29/2015 Physical Exam  Constitutional: She is oriented to person, place, and time. She appears well-developed and well-nourished. No distress.  HENT:  Head: Normocephalic and atraumatic.  Mouth/Throat: Oropharynx is clear and moist.  Eyes: EOM are normal. Pupils are equal, round, and reactive to light.  Neck: Normal range of motion. Neck supple.   Cardiovascular: Normal rate and regular rhythm.   Pulmonary/Chest: Effort normal and breath sounds normal. No respiratory distress. She has no wheezes. She has no rales.  Abdominal: Soft. Bowel sounds are normal. She exhibits no distension and no mass. There is tenderness (mild diffuse tenderness without focality. No rebound or guarding.). There is no rebound and no guarding.  Genitourinary:  White vaginal discharge with mild soft tissue swelling to the introitus. No cervical motion tenderness. No fundal or adnexal tenderness.  Musculoskeletal: Normal range of motion. She exhibits no edema or tenderness.  No CVA tenderness bilaterally.  Neurological: She is alert and oriented to person, place, and time.  Moves all extremities without deficit. Sensation is fully intact.  Skin: Skin is warm and dry. No rash noted. No erythema.  Psychiatric: She has a normal mood and affect. Her behavior is normal.  Nursing note and vitals reviewed.   ED Course  Procedures (including critical care time) Labs Review Labs Reviewed  WET PREP, GENITAL - Abnormal; Notable for the following:    WBC, Wet Prep HPF POC FEW (*)    All other components within normal limits  URINALYSIS, ROUTINE W REFLEX MICROSCOPIC (NOT AT [facility]) - Abnormal; Notable for the following:    APPearance CLOUDY (*)    Hgb urine dipstick TRACE (*)    Bilirubin Urine SMALL (*)    Ketones, ur 15 (*)    Protein, ur 100 (*)    Leukocytes, UA MODERATE (*)    All other components within normal limits  URINE MICROSCOPIC-ADD ON - Abnormal; Notable for the following:    Squamous Epithelial / LPF 6-30 (*)    Bacteria, UA FEW (*)    All other components within normal limits  PREGNANCY, URINE - Abnormal; Notable for the following:    Preg Test, Ur POSITIVE (*)    All other components within normal limits  HCG, QUANTITATIVE, PREGNANCY - Abnormal; Notable for the following:    hCG, Beta Chain, Quant, S 29 (*)    All other components within normal  limits  COMPREHENSIVE METABOLIC PANEL - Abnormal; Notable for the following:    Potassium 3.1 (*)    CO2 21 (*)    All other components within normal limits  CBC WITH DIFFERENTIAL/PLATELET  POC URINE PREG, ED  GC/CHLAMYDIA PROBE AMP ([place]) NOT AT [facility]    Imaging Review US Ob Comp Less 14 Wks  01/31/2016  CLINICAL DATA:  20 year old pregnant female presents with abdominal cramping. Pending quantitative beta HCG. EDC by LMP: 10/04/2016, projecting to an expected gestational age of [redacted] weeks 5 days. EXAM: OBSTETRIC <14 WK [place] AND TRANSVAGINAL OB US TECHNIQUE: Both transabdominal and transvaginal ultrasound examinations were performed for complete evaluation of the gestation as well as the maternal uterus, adnexal regions, and pelvic cul-de-sac. Transvaginal technique was performed to assess early pregnancy. COMPARISON:  None. FINDINGS: Intrauterine gestational sac: None Maternal uterus/adnexae: Anteflexed uterus measures 8.1 x 4.5 x 4.9 cm. No uterine fibroids. Bilayer endometrial thickness is 15 mm. No endometrial cavity fluid or focal endometrial mass demonstrated. Right ovary measures 3.2 x 2.2 x 2.3 cm and contains a 1.8 cm corpus luteum. Left ovary  measures 2.8 x 2.1 x 2.2 cm. No suspicious ovarian or adnexal masses. No abnormal free fluid the pelvis. IMPRESSION: Non-localization of the pregnancy on this scan. No intrauterine gestational sac. No abnormal ovarian or adnexal masses. Sonographic differential diagnosis includes intrauterine gestation too early to visualize, spontaneous abortion or occult ectopic gestation. Recommend close clinical follow-up and serial serum beta HCG monitoring, with repeat obstetric scan as warranted by beta HCG levels and clinical assessment. Electronically Signed   By: Delbert PhenixJason A Poff M.D.   On: 01/31/2016 18:58   [place] Ob Transvaginal  01/31/2016  CLINICAL DATA:  20 year old pregnant female presents with abdominal cramping. Pending quantitative beta HCG. EDC by LMP:  10/04/2016, projecting to an expected gestational age of [redacted] weeks 5 days. EXAM: OBSTETRIC <14 WK US AND TRANSVAGINAL OB US TECHNIQUE: Both transabdominal and transvaginal ultrasound examinations were performed for complete evaluation of the gestation as well as the maternal uterus, adnexal regions, and pelvic cul-de-sac. Transvaginal technique was performed to assess early pregnancy. COMPARISON:  None. FINDINGS: Intrauterine gestational sac: None Maternal uterus/adnexae: Anteflexed uterus measures 8.1 x 4.5 x 4.9 cm. No uterine fibroids. Bilayer endometrial thickness is 15 mm. No endometrial cavity fluid or focal endometrial mass demonstrated. Right ovary measures 3.2 x 2.2 x 2.3 cm and contains a 1.8 cm corpus luteum. Left ovary measures 2.8 x 2.1 x 2.2 cm. No suspicious ovarian or adnexal masses. No abnormal free fluid the pelvis. IMPRESSION: Non-localization of the pregnancy on this scan. No intrauterine gestational sac. No abnormal ovarian or adnexal masses. Sonographic differential diagnosis includes intrauterine gestation too early to visualize, spontaneous abortion or occult ectopic gestation. Recommend close clinical follow-up and serial serum beta HCG monitoring, with repeat obstetric scan as warranted by beta HCG levels and clinical assessment. Electronically Signed   By: Delbert PhenixJason A Poff M.D.   On: 01/31/2016 18:58   I have personally reviewed and evaluated these images and lab results as part of my medical decision-making.   EKG Interpretation None      MDM   Final diagnoses:  Elevated serum hCG  Vaginal pain  Abdominal cramps  Constipation, unspecified constipation type   No IUP seen on ultrasound. Quantitative hCG is 29. This may be early pregnancy. Patient understands the need to have her hCG level repeated in 2 days. Abdominal exam remains benign.     Loren Raceravid Corrina Steffensen, MD 01/31/16 2120

## 2016-01-31 NOTE — Discharge Instructions (Signed)
Return to the emergency department in 2 days to have hCG level repeated. You may also go to [facility] to have this test done. Return immediately for any worsening abdominal pain, vaginal bleeding, fever or for any concerns. Take MiraLAX daily as prescribed until you're having regular bowel movements.    Constipation, Adult Constipation is when a person has fewer than three bowel movements a week, has difficulty having a bowel movement, or has stools that are dry, hard, or larger than normal. As people grow older, constipation is more common. A low-fiber diet, not taking in enough fluids, and taking certain medicines may make constipation worse.  CAUSES   Certain medicines, such as antidepressants, pain medicine, iron supplements, antacids, and water pills.   Certain diseases, such as diabetes, irritable bowel syndrome (IBS), thyroid disease, or depression.   Not drinking enough water.   Not eating enough fiber-rich foods.   Stress or travel.   Lack of physical activity or exercise.   Ignoring the urge to have a bowel movement.   Using laxatives too much.  SIGNS AND SYMPTOMS   Having fewer than three bowel movements a week.   Straining to have a bowel movement.   Having stools that are hard, dry, or larger than normal.   Feeling full or bloated.   Pain in the lower abdomen.   Not feeling relief after having a bowel movement.  DIAGNOSIS  Your health care provider will take a medical history and perform a physical exam. Further testing may be done for severe constipation. Some tests may include:  A barium enema X-ray to examine your rectum, colon, and, sometimes, your small intestine.   A sigmoidoscopy to examine your lower colon.   A colonoscopy to examine your entire colon. TREATMENT  Treatment will depend on the severity of your constipation and what is causing it. Some dietary treatments include drinking more fluids and eating more fiber-rich  foods. Lifestyle treatments may include regular exercise. If these diet and lifestyle recommendations do not help, your health care provider may recommend taking over-the-counter laxative medicines to help you have bowel movements. Prescription medicines may be prescribed if over-the-counter medicines do not work.  HOME CARE INSTRUCTIONS   Eat foods that have a lot of fiber, such as fruits, vegetables, whole grains, and beans.  Limit foods high in fat and processed sugars, such as french fries, hamburgers, cookies, candies, and soda.   A fiber supplement may be added to your diet if you cannot get enough fiber from foods.   Drink enough fluids to keep your urine clear or pale yellow.   Exercise regularly or as directed by your health care provider.   Go to the restroom when you have the urge to go. Do not hold it.   Only take over-the-counter or prescription medicines as directed by your health care provider. Do not take other medicines for constipation without talking to your health care provider first.  SEEK IMMEDIATE MEDICAL CARE IF:   You have bright red blood in your stool.   Your constipation lasts for more than 4 days or gets worse.   You have abdominal or rectal pain.   You have thin, pencil-like stools.   You have unexplained weight loss. MAKE SURE YOU:   Understand these instructions.  Will watch your condition.  Will get help right away if you are not doing well or get worse.   This information is not intended to replace advice given to you by your health care  provider. Make sure you discuss any questions you have with your health care provider.   Document Released: 05/03/2004 Document Revised: 08/26/2014 Document Reviewed: 05/17/2013 Elsevier Interactive Patient Education 2016 [employer]. First Trimester of Pregnancy The first trimester of pregnancy is from week 1 until the end of week 12 (months 1 through 3). A week after a sperm fertilizes an egg,  the egg will implant on the wall of the uterus. This embryo will begin to develop into a baby. Genes from you and your partner are forming the baby. The female genes determine whether the baby is a boy or a girl. At 6-8 weeks, the eyes and face are formed, and the heartbeat can be seen on ultrasound. At the end of 12 weeks, all the baby's organs are formed.  Now that you are pregnant, you will want to do everything you can to have a healthy baby. Two of the most important things are to get good prenatal care and to follow your health care provider's instructions. Prenatal care is all the medical care you receive before the baby's birth. This care will help prevent, find, and treat any problems during the pregnancy and childbirth. BODY CHANGES Your body goes through many changes during pregnancy. The changes vary from woman to woman.   You may gain or lose a couple of pounds at first.  You may feel sick to your stomach (nauseous) and throw up (vomit). If the vomiting is uncontrollable, call your health care provider.  You may tire easily.  You may develop headaches that can be relieved by medicines approved by your health care provider.  You may urinate more often. Painful urination may mean you have a bladder infection.  You may develop heartburn as a result of your pregnancy.  You may develop constipation because certain hormones are causing the muscles that push waste through your intestines to slow down.  You may develop hemorrhoids or swollen, bulging veins (varicose veins).  Your breasts may begin to grow larger and become tender. Your nipples may stick out more, and the tissue that surrounds them (areola) may become darker.  Your gums may bleed and may be sensitive to brushing and flossing.  Dark spots or blotches (chloasma, mask of pregnancy) may develop on your face. This will likely fade after the baby is born.  Your menstrual periods will stop.  You may have a loss of  appetite.  You may develop cravings for certain kinds of food.  You may have changes in your emotions from day to day, such as being excited to be pregnant or being concerned that something may go wrong with the pregnancy and baby.  You may have more vivid and strange dreams.  You may have changes in your hair. These can include thickening of your hair, rapid growth, and changes in texture. Some women also have hair loss during or after pregnancy, or hair that feels dry or thin. Your hair will most likely return to normal after your baby is born. WHAT TO EXPECT AT YOUR PRENATAL VISITS During a routine prenatal visit:  You will be weighed to make sure you and the baby are growing normally.  Your blood pressure will be taken.  Your abdomen will be measured to track your baby's growth.  The fetal heartbeat will be listened to starting around week 10 or 12 of your pregnancy.  Test results from any previous visits will be discussed. Your health care provider may ask you:  How you are feeling.  If you are feeling the baby move.  If you have had any abnormal symptoms, such as leaking fluid, bleeding, severe headaches, or abdominal cramping.  If you are using any tobacco products, including cigarettes, chewing tobacco, and electronic cigarettes.  If you have any questions. Other tests that may be performed during your first trimester include:  Blood tests to find your blood type and to check for the presence of any previous infections. They will also be used to check for low iron levels (anemia) and Rh antibodies. Later in the pregnancy, blood tests for diabetes will be done along with other tests if problems develop.  Urine tests to check for infections, diabetes, or protein in the urine.  An ultrasound to confirm the proper growth and development of the baby.  An amniocentesis to check for possible genetic problems.  Fetal screens for spina bifida and Down syndrome.  You may  need other tests to make sure you and the baby are doing well.  HIV (human immunodeficiency virus) testing. Routine prenatal testing includes screening for HIV, unless you choose not to have this test. HOME CARE INSTRUCTIONS  Medicines  Follow your health care provider's instructions regarding medicine use. Specific medicines may be either safe or unsafe to take during pregnancy.  Take your prenatal vitamins as directed.  If you develop constipation, try taking a stool softener if your health care provider approves. Diet  Eat regular, well-balanced meals. Choose a variety of foods, such as meat or vegetable-based protein, fish, milk and low-fat dairy products, vegetables, fruits, and whole grain breads and cereals. Your health care provider will help you determine the amount of weight gain that is right for you.  Avoid raw meat and uncooked cheese. These carry germs that can cause birth defects in the baby.  Eating four or five small meals rather than three large meals a day may help relieve nausea and vomiting. If you start to feel nauseous, eating a few soda crackers can be helpful. Drinking liquids between meals instead of during meals also seems to help nausea and vomiting.  If you develop constipation, eat more high-fiber foods, such as fresh vegetables or fruit and whole grains. Drink enough fluids to keep your urine clear or pale yellow. Activity and Exercise  Exercise only as directed by your health care provider. Exercising will help you:  Control your weight.  Stay in shape.  Be prepared for labor and delivery.  Experiencing pain or cramping in the lower abdomen or low back is a good sign that you should stop exercising. Check with your health care provider before continuing normal exercises.  Try to avoid standing for long periods of time. Move your legs often if you must stand in one place for a long time.  Avoid heavy lifting.  Wear low-heeled shoes, and practice good  posture.  You may continue to have sex unless your health care provider directs you otherwise. Relief of Pain or Discomfort  Wear a good support bra for breast tenderness.   Take warm sitz baths to soothe any pain or discomfort caused by hemorrhoids. Use hemorrhoid cream if your health care provider approves.   Rest with your legs elevated if you have leg cramps or low back pain.  If you develop varicose veins in your legs, wear support hose. Elevate your feet for 15 minutes, 3-4 times a day. Limit salt in your diet. Prenatal Care  Schedule your prenatal visits by the twelfth week of pregnancy. They are usually scheduled monthly  at first, then more often in the last 2 months before delivery.  Write down your questions. Take them to your prenatal visits.  Keep all your prenatal visits as directed by your health care provider. Safety  Wear your seat belt at all times when driving.  Make a list of emergency phone numbers, including numbers for family, friends, the hospital, and police and fire departments. General Tips  Ask your health care provider for a referral to a local prenatal education class. Begin classes no later than at the beginning of month 6 of your pregnancy.  Ask for help if you have counseling or nutritional needs during pregnancy. Your health care provider can offer advice or refer you to specialists for help with various needs.  Do not use hot tubs, steam rooms, or saunas.  Do not douche or use tampons or scented sanitary pads.  Do not cross your legs for long periods of time.  Avoid cat litter boxes and soil used by cats. These carry germs that can cause birth defects in the baby and possibly loss of the fetus by miscarriage or stillbirth.  Avoid all smoking, herbs, alcohol, and medicines not prescribed by your health care provider. Chemicals in these affect the formation and growth of the baby.  Do not use any tobacco products, including cigarettes, chewing  tobacco, and electronic cigarettes. If you need help quitting, ask your health care provider. You may receive counseling support and other resources to help you quit.  Schedule a dentist appointment. At home, brush your teeth with a soft toothbrush and be gentle when you floss. SEEK MEDICAL CARE IF:   You have dizziness.  You have mild pelvic cramps, pelvic pressure, or nagging pain in the abdominal area.  You have persistent nausea, vomiting, or diarrhea.  You have a bad smelling vaginal discharge.  You have pain with urination.  You notice increased swelling in your face, hands, legs, or ankles. SEEK IMMEDIATE MEDICAL CARE IF:   You have a fever.  You are leaking fluid from your vagina.  You have spotting or bleeding from your vagina.  You have severe abdominal cramping or pain.  You have rapid weight gain or loss.  You vomit blood or material that looks like coffee grounds.  You are exposed to [place] measles and have never had them.  You are exposed to fifth disease or chickenpox.  You develop a severe headache.  You have shortness of breath.  You have any kind of trauma, such as from a fall or a car accident.   This information is not intended to replace advice given to you by your health care provider. Make sure you discuss any questions you have with your health care provider.   Document Released: 07/30/2001 Document Revised: 08/26/2014 Document Reviewed: 06/15/2013 Elsevier Interactive Patient Education [employer]

## 2016-02-01 LAB — GC/CHLAMYDIA PROBE AMP (~~LOC~~) NOT AT ARMC
Chlamydia: NEGATIVE
Neisseria Gonorrhea: NEGATIVE

## 2016-02-02 ENCOUNTER — Inpatient Hospital Stay (HOSPITAL_COMMUNITY)
Admission: AD | Admit: 2016-02-02 | Discharge: 2016-02-02 | Disposition: A | Discharge status: Home / Self Care | Payer: Self-pay | Source: Ambulatory Visit | Attending: Obstetrics & Gynecology | Admitting: Obstetrics & Gynecology

## 2016-02-02 ENCOUNTER — Encounter (HOSPITAL_COMMUNITY): Payer: Self-pay | Admitting: *Deleted

## 2016-02-02 DIAGNOSIS — F17210 Nicotine dependence, cigarettes, uncomplicated: Secondary | ICD-10-CM | POA: Insufficient documentation

## 2016-02-02 DIAGNOSIS — Z3A01 Less than 8 weeks gestation of pregnancy: Secondary | ICD-10-CM | POA: Insufficient documentation

## 2016-02-02 DIAGNOSIS — O99511 Diseases of the respiratory system complicating pregnancy, first trimester: Secondary | ICD-10-CM | POA: Insufficient documentation

## 2016-02-02 DIAGNOSIS — O3680X0 Pregnancy with inconclusive fetal viability, not applicable or unspecified: Secondary | ICD-10-CM

## 2016-02-02 DIAGNOSIS — O4691 Antepartum hemorrhage, unspecified, first trimester: Secondary | ICD-10-CM

## 2016-02-02 DIAGNOSIS — J45909 Unspecified asthma, uncomplicated: Secondary | ICD-10-CM | POA: Insufficient documentation

## 2016-02-02 DIAGNOSIS — O99331 Smoking (tobacco) complicating pregnancy, first trimester: Secondary | ICD-10-CM | POA: Insufficient documentation

## 2016-02-02 DIAGNOSIS — O26891 Other specified pregnancy related conditions, first trimester: Secondary | ICD-10-CM | POA: Insufficient documentation

## 2016-02-02 LAB — HCG, QUANTITATIVE, PREGNANCY: HCG, BETA CHAIN, QUANT, S: 25 m[IU]/mL — AB (ref <5)

## 2016-02-02 LAB — ABO/RH: ABO/RH(D): B POS

## 2016-02-02 NOTE — Discharge Instructions (Signed)

## 2016-02-02 NOTE — MAU Provider Note (Signed)
History   [id number]   Chief Complaint  Patient presents with  . Follow-up    HPI Sue Graves is a 20 y.o. female G1P0 here for follow-up BHCG.  Upon review of the records patient was first seen on 6/14 @ the ED for abdominal pain. BHCG on that day was 29.  Ultrasound showed no IUP or adnexal mass. GC/CT and wet prep were collected.  Results were negative.   Pt discharged home & told to come to MAU for f/u BHCG.   Pt here today with no report of abdominal pain or vaginal bleeding.   All other systems negative.     Patient's last menstrual period was 12/29/2015.  OB History  Gravida Para Term Preterm AB SAB TAB Ectopic Multiple Living  1             # Outcome Date GA Lbr Len/2nd Weight Sex Delivery Anes PTL Lv  1 Current               Past Medical History  Diagnosis Date  . Asthma     No family history on file.  Social History   Social History  . Marital Status: Single    Spouse Name: N/A  . Number of Children: N/A  . Years of Education: N/A   Social History Main Topics  . Smoking status: Current Every Day Smoker -- 0.50 packs/day    Types: Cigarettes  . Smokeless tobacco: Not on file  . Alcohol Use: Yes     Comment: occasionally   . Drug Use: Yes    Special: Marijuana  . Sexual Activity: Yes    Birth Control/ Protection: None   Other Topics Concern  . Not on file   Social History Narrative    Allergies  Allergen Reactions  . Tomato Hives and Swelling    No current facility-administered medications on file prior to encounter.   Current Outpatient Prescriptions on File Prior to Encounter  Medication Sig Dispense Refill  . albuterol (PROVENTIL HFA;VENTOLIN HFA) 108 (90 BASE) MCG/ACT inhaler Inhale 2 puffs into the lungs every 4 (four) hours as needed for wheezing or shortness of breath.     . cyclobenzaprine (FLEXERIL) 10 MG tablet Take 1 tablet (10 mg total) by mouth 2 (two) times daily as needed for muscle spasms. 30 tablet 0  . docusate sodium  (COLACE) 100 MG capsule Take 1 capsule (100 mg total) by mouth every 12 (twelve) hours. 60 capsule 0  . ibuprofen (ADVIL,MOTRIN) 800 MG tablet Take 1 tablet (800 mg total) by mouth 3 (three) times daily. 60 tablet 0  . metroNIDAZOLE (FLAGYL) 500 MG tablet Take 1 tablet (500 mg total) by mouth 2 (two) times daily. One po bid x 7 days 14 tablet 0  . polyethylene glycol (MIRALAX / GLYCOLAX) packet Take 17 g by mouth daily. 14 each 0  . traMADol (ULTRAM) 50 MG tablet Take 1 tablet (50 mg total) by mouth every 12 (twelve) hours as needed for severe pain. 6 tablet 0     Physical Exam   Filed Vitals:   02/02/16 1527  BP: 112/75  Pulse: 90  Temp: 98 F (36.7 C)  TempSrc: Oral  Resp: 16  Height:  (1.499 m)  Weight: 93 lb (42.185 kg)    Physical Exam  Constitutional: She appears well-developed and well-nourished. No distress.  HENT:  Head: Normocephalic and atraumatic.  Cardiovascular: Normal rate.   Respiratory: Effort normal. No respiratory distress.  Musculoskeletal: Normal range of motion.  Skin: She is not diaphoretic.  Psychiatric: She has a normal mood and affect. Her behavior is normal. Judgment and thought content normal.    MAU Course  Procedures Component     Latest Ref Rng 01/31/2016 02/02/2016  HCG, Beta Chain, Quant, S     <5 mIU/mL 29 (H) 25 (H)   MDM Minimal drop in BHCG No pain or bleeding Abo/rh ordered today to have on file Will have pt return on Sunday for BHCG  Assessment and Plan  20 y.o. G1P0 at 760w0d wks Pregnancy Follow-up BHCG Pregnancy of Unknown Location  P: Discharge home Return in 48 hrs for BHCG Discussed reasons to return to MAU  Judeth HornErin Hartlyn Reigel, NP 02/02/2016 4:53 PM

## 2016-02-02 NOTE — MAU Note (Signed)
Pt seen @ [facility] 2 days ago, had quant of 29, was instructed to come to MAU today for repeat quant.  Pt denies pain or bleeding.

## 2016-02-04 ENCOUNTER — Encounter (HOSPITAL_COMMUNITY): Payer: Self-pay | Admitting: *Deleted

## 2016-02-04 ENCOUNTER — Inpatient Hospital Stay (HOSPITAL_COMMUNITY)
Admission: AD | Admit: 2016-02-04 | Discharge: 2016-02-04 | Disposition: A | Discharge status: Home / Self Care | Payer: Self-pay | Source: Ambulatory Visit | Attending: Obstetrics and Gynecology | Admitting: Obstetrics and Gynecology

## 2016-02-04 DIAGNOSIS — O039 Complete or unspecified spontaneous abortion without complication: Secondary | ICD-10-CM

## 2016-02-04 DIAGNOSIS — Z3A01 Less than 8 weeks gestation of pregnancy: Secondary | ICD-10-CM | POA: Insufficient documentation

## 2016-02-04 DIAGNOSIS — O209 Hemorrhage in early pregnancy, unspecified: Secondary | ICD-10-CM | POA: Insufficient documentation

## 2016-02-04 LAB — CBC
HEMATOCRIT: 35.9 % — AB (ref 36.0–46.0)
HEMOGLOBIN: 12.9 g/dL (ref 12.0–15.0)
MCH: 32.9 pg (ref 26.0–34.0)
MCHC: 35.9 g/dL (ref 30.0–36.0)
MCV: 91.6 fL (ref 78.0–100.0)
Platelets: 227 10*3/uL (ref 150–400)
RBC: 3.92 MIL/uL (ref 3.87–5.11)
RDW: 12.1 % (ref 11.5–15.5)
WBC: 8.8 10*3/uL (ref 4.0–10.5)

## 2016-02-04 LAB — HCG, QUANTITATIVE, PREGNANCY: hCG, Beta Chain, Quant, S: 10 m[IU]/mL — ABNORMAL HIGH (ref <5)

## 2016-02-04 MED ORDER — IBUPROFEN 600 MG PO TABS: 600.0000 mg | ORAL_TABLET | Freq: Four times a day (QID) | ORAL | Status: DC | PRN

## 2016-02-04 NOTE — MAU Provider Note (Signed)
History   First Provider Initiated Solicitor with Patient 02/04/16 [phone]     Chief Complaint:  Vaginal Bleeding   Sue Graves is  20 y.o. G1P0 Patient's last menstrual period was 12/29/2015.Marland Kitchen Patient is here for follow up of quantitative HCG and ongoing surveillance of pregnancy status.   She is [redacted]w[redacted]d weeks gestation  by LMP.    Since her last visit, the patient is with new complaint.     ROS Abdomin Pain: Cramping since yesterday Vaginal bleeding: similar to period.   Passage of clots or tissue: none Dizziness: None  Her previous Quantitative HCG values are:  Results for Sue, Graves (MRN [id number]) as of 02/04/2016 09:40  Ref. Range 01/31/2016 19:28 02/02/2016 16:03  HCG, Beta Chain, Quant, S Latest Ref Range: <5 mIU/mL 29 (H) 25 (H)    Physical Exam   BP 134/93 mmHg  Pulse 86  Temp(Src) 98.3 F (36.8 C) (Oral)  Resp 20  Ht 4' 11.02" (1.499 m)  Wt 93 lb (42.185 kg)  BMI 18.77 kg/m2  LMP 12/29/2015 Constitutional: Well-nourished female in mild distress. No pallor Neuro: Alert and oriented 4 Cardiovascular: Normal rate Respiratory: Normal effort and rate Abdomen: Soft, nontender Gynecological Exam: examination not indicated  Labs: Results for orders placed or performed during the hospital encounter of 02/04/16 (from the past 24 hour(s))  CBC   Collection Time: 02/04/16  9:52 AM  Result Value Ref Range   WBC 8.8 4.0 - 10.5 K/uL   RBC 3.92 3.87 - 5.11 MIL/uL   Hemoglobin 12.9 12.0 - 15.0 g/dL   HCT 11.9 (L) 14.7 - 82.9 %   MCV 91.6 78.0 - 100.0 fL   MCH 32.9 26.0 - 34.0 pg   MCHC 35.9 30.0 - 36.0 g/dL   RDW 56.2 13.0 - 86.5 %   Platelets 227 150 - 400 K/uL  hCG, quantitative, pregnancy   Collection Time: 02/04/16  9:52 AM  Result Value Ref Range   hCG, Beta Chain, Quant, S 10 (H) <5 mIU/mL    Ultrasound Studies:   US Ob Comp Less 14 Wks  01/31/2016  CLINICAL DATA:  19 year old pregnant female presents with abdominal cramping. Pending quantitative beta HCG.  EDC by LMP: 10/04/2016, projecting to an expected gestational age of [redacted] weeks 5 days. EXAM: OBSTETRIC <14 WK [place] AND TRANSVAGINAL OB US TECHNIQUE: Both transabdominal and transvaginal ultrasound examinations were performed for complete evaluation of the gestation as well as the maternal uterus, adnexal regions, and pelvic cul-de-sac. Transvaginal technique was performed to assess early pregnancy. COMPARISON:  None. FINDINGS: Intrauterine gestational sac: None Maternal uterus/adnexae: Anteflexed uterus measures 8.1 x 4.5 x 4.9 cm. No uterine fibroids. Bilayer endometrial thickness is 15 mm. No endometrial cavity fluid or focal endometrial mass demonstrated. Right ovary measures 3.2 x 2.2 x 2.3 cm and contains a 1.8 cm corpus luteum. Left ovary measures 2.8 x 2.1 x 2.2 cm. No suspicious ovarian or adnexal masses. No abnormal free fluid the pelvis. IMPRESSION: Non-localization of the pregnancy on this scan. No intrauterine gestational sac. No abnormal ovarian or adnexal masses. Sonographic differential diagnosis includes intrauterine gestation too early to visualize, spontaneous abortion or occult ectopic gestation. Recommend close clinical follow-up and serial serum beta HCG monitoring, with repeat obstetric scan as warranted by beta HCG levels and clinical assessment. Electronically Signed   By: Delbert Phenix M.D.   On: 01/31/2016 18:58   US Ob Transvaginal  01/31/2016  CLINICAL DATA:  20 year old pregnant female presents with abdominal cramping. Pending quantitative beta HCG.  EDC by LMP: 10/04/2016, projecting to an expected gestational age of [redacted] weeks 5 days. EXAM: OBSTETRIC <14 WK US AND TRANSVAGINAL OB US TECHNIQUE: Both transabdominal and transvaginal ultrasound examinations were performed for complete evaluation of the gestation as well as the maternal uterus, adnexal regions, and pelvic cul-de-sac. Transvaginal technique was performed to assess early pregnancy. COMPARISON:  None. FINDINGS: Intrauterine  gestational sac: None Maternal uterus/adnexae: Anteflexed uterus measures 8.1 x 4.5 x 4.9 cm. No uterine fibroids. Bilayer endometrial thickness is 15 mm. No endometrial cavity fluid or focal endometrial mass demonstrated. Right ovary measures 3.2 x 2.2 x 2.3 cm and contains a 1.8 cm corpus luteum. Left ovary measures 2.8 x 2.1 x 2.2 cm. No suspicious ovarian or adnexal masses. No abnormal free fluid the pelvis. IMPRESSION: Non-localization of the pregnancy on this scan. No intrauterine gestational sac. No abnormal ovarian or adnexal masses. Sonographic differential diagnosis includes intrauterine gestation too early to visualize, spontaneous abortion or occult ectopic gestation. Recommend close clinical follow-up and serial serum beta HCG monitoring, with repeat obstetric scan as warranted by beta HCG levels and clinical assessment. Electronically Signed   By: Delbert PhenixJason A Poff M.D.   On: 01/31/2016 18:58    MAU course/MDM: Quantitative hCG ordered  Pain and bleeding in early pregnancy with drop in Quant, but hemodynamically stable.  Assessment:  6567w2d weeks gestation drop in Quant C/W SAB  Plan: Discharge home in stable condition. SAB precautions Support given.      Follow-up Information    [facility] On 02[facility]12/2016.   Specialty:  Obstetrics and Gynecology   Why:  For repeat blood work   Contact information:   [address] [place] [id number] [phone]      [facility]   Why:  As needed in emergencies   Contact information:   [address] [id number] [place] [id number] [phone]       Medication List    STOP taking these medications        cyclobenzaprine 10 MG tablet  Commonly known as:  FLEXERIL     docusate sodium 100 MG capsule  Commonly known as:  COLACE     polyethylene glycol packet  Commonly known as:  MIRALAX / GLYCOLAX       TAKE these medications        ibuprofen 600 MG tablet  Commonly known as:  ADVIL,MOTRIN  Take 1 tablet (600 mg total) by mouth every 6 (six) hours as needed.        Dorathy KinsmanVirginia Shaquisha Wynn, CNM 02/04/2016, 11:06 AM  2/3

## 2016-02-04 NOTE — MAU Note (Signed)
Woke up with bleeding at 7am. Cramping started around 7am.  yesterday spotting all day. This morning looks like period is starting

## 2016-02-04 NOTE — Discharge Instructions (Signed)
Miscarriage  A miscarriage is the sudden loss of an unborn baby (fetus) before the 20th week of pregnancy. Most miscarriages happen in the first 3 months of pregnancy. Sometimes, it happens before a woman even knows she is pregnant. A miscarriage is also called a "spontaneous miscarriage" or "early pregnancy loss." Having a miscarriage can be an emotional experience. Talk with your caregiver about any questions you may have about miscarrying, the grieving process, and your future pregnancy plans.  CAUSES    Problems with the fetal chromosomes that make it impossible for the baby to develop normally. Problems with the baby's genes or chromosomes are most often the result of errors that occur, by chance, as the embryo divides and grows. The problems are not inherited from the parents.   Infection of the cervix or uterus.    Hormone problems.    Problems with the cervix, such as having an incompetent cervix. This is when the tissue in the cervix is not strong enough to hold the pregnancy.    Problems with the uterus, such as an abnormally shaped uterus, uterine fibroids, or congenital abnormalities.    Certain medical conditions.    Smoking, drinking alcohol, or taking illegal drugs.    Trauma.   Often, the cause of a miscarriage is unknown.   SYMPTOMS    Vaginal bleeding or spotting, with or without cramps or pain.   Pain or cramping in the abdomen or lower back.   Passing fluid, tissue, or blood clots from the vagina.  DIAGNOSIS   Your caregiver will perform a physical exam. You may also have an ultrasound to confirm the miscarriage. Blood or urine tests may also be ordered.  TREATMENT    Sometimes, treatment is not necessary if you naturally pass all the fetal tissue that was in the uterus. If some of the fetus or placenta remains in the body (incomplete miscarriage), tissue left behind may become infected and must be removed. Usually, a dilation and curettage (D and C) procedure is performed.  During a D and C procedure, the cervix is widened (dilated) and any remaining fetal or placental tissue is gently removed from the uterus.   Antibiotic medicines are prescribed if there is an infection. Other medicines may be given to reduce the size of the uterus (contract) if there is a lot of bleeding.   If you have Rh negative blood and your baby was Rh positive, you will need a Rh immunoglobulin shot. This shot will protect any future baby from having Rh blood problems in future pregnancies.  HOME CARE INSTRUCTIONS    Your caregiver may order bed rest or may allow you to continue light activity. Resume activity as directed by your caregiver.   Have someone help with home and family responsibilities during this time.    Keep track of the number of sanitary pads you use each day and how soaked (saturated) they are. Write down this information.    Do not use tampons. Do not douche or have sexual intercourse until approved by your caregiver.    Only take over-the-counter or prescription medicines for pain or discomfort as directed by your caregiver.    Do not take aspirin. Aspirin can cause bleeding.    Keep all follow-up appointments with your caregiver.    If you or your partner have problems with grieving, talk to your caregiver or seek counseling to help cope with the pregnancy loss. Allow enough time to grieve before trying to get pregnant again.     SEEK IMMEDIATE MEDICAL CARE IF:    You have severe cramps or pain in your back or abdomen.   You have a fever.   You pass large blood clots (walnut-sized or larger) ortissue from your vagina. Save any tissue for your caregiver to inspect.    Your bleeding increases.    You have a thick, bad-smelling vaginal discharge.   You become lightheaded, weak, or you faint.    You have chills.   MAKE SURE YOU:   Understand these instructions.   Will watch your condition.   Will get help right away if you are not doing well or get worse.     This  information is not intended to replace advice given to you by your health care provider. Make sure you discuss any questions you have with your health care provider.     Document Released: 01/29/2001 Document Revised: 11/30/2012 Document Reviewed: 09/24/2011  Elsevier Interactive Patient Education 2016 Elsevier Inc.

## 2016-02-29 ENCOUNTER — Other Ambulatory Visit: Payer: Medicaid Other

## 2016-03-01 ENCOUNTER — Other Ambulatory Visit: Payer: Medicaid Other

## 2016-03-04 ENCOUNTER — Other Ambulatory Visit: Payer: Medicaid Other

## 2016-03-07 ENCOUNTER — Encounter (HOSPITAL_COMMUNITY): Payer: Self-pay | Admitting: Emergency Medicine

## 2016-03-07 ENCOUNTER — Emergency Department (HOSPITAL_COMMUNITY)
Admission: EM | Admit: 2016-03-07 | Discharge: 2016-03-07 | Disposition: A | Discharge status: Home / Self Care | Payer: Medicaid Other | Attending: Emergency Medicine | Admitting: Emergency Medicine

## 2016-03-07 ENCOUNTER — Ambulatory Visit (HOSPITAL_COMMUNITY)
Admission: EM | Admit: 2016-03-07 | Discharge: 2016-03-07 | Disposition: A | Discharge status: Home / Self Care | Payer: Medicaid Other | Attending: Emergency Medicine | Admitting: Emergency Medicine

## 2016-03-07 DIAGNOSIS — Z87891 Personal history of nicotine dependence: Secondary | ICD-10-CM | POA: Insufficient documentation

## 2016-03-07 DIAGNOSIS — L259 Unspecified contact dermatitis, unspecified cause: Secondary | ICD-10-CM

## 2016-03-07 DIAGNOSIS — R102 Pelvic and perineal pain: Secondary | ICD-10-CM

## 2016-03-07 DIAGNOSIS — L2489 Irritant contact dermatitis due to other agents: Secondary | ICD-10-CM | POA: Insufficient documentation

## 2016-03-07 DIAGNOSIS — J45909 Unspecified asthma, uncomplicated: Secondary | ICD-10-CM | POA: Insufficient documentation

## 2016-03-07 DIAGNOSIS — A5901 Trichomonal vulvovaginitis: Secondary | ICD-10-CM

## 2016-03-07 LAB — WET PREP, GENITAL
CLUE CELLS WET PREP: NONE SEEN
SPERM: NONE SEEN
Yeast Wet Prep HPF POC: NONE SEEN

## 2016-03-07 LAB — URINALYSIS, ROUTINE W REFLEX MICROSCOPIC
GLUCOSE, UA: NEGATIVE mg/dL
Hgb urine dipstick: NEGATIVE
KETONES UR: 15 mg/dL — AB
Nitrite: NEGATIVE
PH: 6 (ref 5.0–8.0)
Protein, ur: 30 mg/dL — AB
SPECIFIC GRAVITY, URINE: 1.029 (ref 1.005–1.030)

## 2016-03-07 LAB — URINE MICROSCOPIC-ADD ON: RBC / HPF: NONE SEEN RBC/hpf (ref 0–5)

## 2016-03-07 MED ORDER — PREDNISONE 50 MG PO TABS: ORAL_TABLET | ORAL | Status: DC

## 2016-03-07 MED ORDER — OXYCODONE-ACETAMINOPHEN 5-325 MG PO TABS
1.0000 | ORAL_TABLET | Freq: Once | ORAL | Status: AC
  Administered 2016-03-07: 1 via ORAL
  Filled 2016-03-07: qty 1

## 2016-03-07 MED ORDER — METRONIDAZOLE 500 MG PO TABS: 500.0000 mg | ORAL_TABLET | Freq: Two times a day (BID) | ORAL | Status: DC

## 2016-03-07 MED ORDER — NAPROXEN 500 MG PO TABS: 500.0000 mg | ORAL_TABLET | Freq: Two times a day (BID) | ORAL | Status: DC

## 2016-03-07 MED ORDER — ACETAMINOPHEN 325 MG PO TABS
ORAL_TABLET | ORAL | Status: AC
  Filled 2016-03-07: qty 2

## 2016-03-07 MED ORDER — ACETAMINOPHEN 325 MG PO TABS
650.0000 mg | ORAL_TABLET | Freq: Once | ORAL | Status: AC
  Administered 2016-03-07: 650 mg via ORAL

## 2016-03-07 NOTE — Discharge Instructions (Signed)
SINCE WE ARE UNABLE TO DO AN EXAMINATION TO DIAGNOSE YOUR COMPLAINT TODAY  IT IS SUGGESTED THAT YOU GO TO EITHER THE EM[facility]ENCY ROOM OR [facility]  IF YOU NEED EXAMINATION SEDATION THEY WILL BE ABLE TO PROVIDE THIS FOR YOU.  I SUGGEST THAT YOU GO THERE AT THIS TIME FOR FURTHER EVALUATION

## 2016-03-07 NOTE — ED Notes (Signed)
Patient can't void at this time 

## 2016-03-07 NOTE — ED Notes (Signed)
[facility] AND TOLD TO F/U WITH [facility] but came here instead,  Used deodorant  on her  Bottom , now has yellow d/c and  Burning  And it is very irritated

## 2016-03-07 NOTE — ED Provider Notes (Signed)
CSN: [id number]     Arrival date & time 03/07/16  1244 History   First MD Initiated Contact with Patient 03/07/16 1322     Chief Complaint  Patient presents with  . vaginal irritation    (Consider location/radiation/quality/duration/timing/severity/associated sxs/prior Treatment) HPI History obtained from patient:  Pt presents with the cc of:  Vaginal pain and swelling Duration of symptoms: Several days Treatment prior to arrival: Application of warm compresses Context: Patient states that she was extremely hot the other day and used deodorant in her vagina only area. She states the day after she developed pain and irritation and at this time now has some drainage from a sore area in her vagina. Other symptoms include: Yellow drainage from the vagina Pain score: 8 FAMILY HISTORY: Hypertension    Past Medical History  Diagnosis Date  . Asthma    History reviewed. No pertinent past surgical history. History reviewed. No pertinent family history. Social History  Substance Use Topics  . Smoking status: Former Smoker -- 0.50 packs/day    Types: Cigarettes  . Smokeless tobacco: None  . Alcohol Use: No     Comment: occasionally    OB History    Gravida Para Term Preterm AB TAB SAB Ectopic Multiple Living   1              Review of Systems  Denies: HEADACHE, NAUSEA, ABDOMINAL PAIN, CHEST PAIN, CONGESTION, DYSURIA, SHORTNESS OF BREATH  Allergies  Tomato  Home Medications   Prior to Admission medications   Medication Sig Start Date End Date Taking? Authorizing Provider  ibuprofen (ADVIL,MOTRIN) 600 MG tablet Take 1 tablet (600 mg total) by mouth every 6 (six) hours as needed. 02/04/16   Dorathy KinsmanVirginia Smith, CNM   Meds Ordered and Administered this Visit  Medications - No data to display  BP 143/92 mmHg  Pulse 121  Temp(Src) 100.2 F (37.9 C) (Oral)  Resp 12  SpO2 100%  LMP  (LMP Unknown) No data found.   Physical Exam VAGINAL EXAMINATION ATTEMPTED WITH PT'S PERMISSION  AND NURSE CHAPERONE PRESENT.  UNABLE TO PERFORM EVEN TACTILE EXAM DUE TO PT DISCOMFORT  ED Course  Procedures (including critical care time)  Labs Review Labs Reviewed - No data to display  Imaging Review No results found.   Visual Acuity Review  Right Eye Distance:   Left Eye Distance:   Bilateral Distance:    Right Eye Near:   Left Eye Near:    Bilateral Near:         MDM   1. Vaginal pain    PAT[facility].  UNABLE TO PERFORM EXAMINATION DUE TO PAIN.     Tharon AquasFrank C Amiliah Campisi, PA 03/07/16 1350

## 2016-03-07 NOTE — ED Provider Notes (Signed)
CSN: [id number]     Arrival date & time 03/07/16  1445 History   First MD Initiated Contact with Patient 03/07/16 2011     Chief Complaint  Patient presents with  . Vaginitis   HPI  Sue Graves is a 20 year old female presenting with vaginal pain. Patient states she applied an aluminum based deodorant to her vagina and buttocks 2 days ago. She states that she was out in the hot sun and was very sweaty for most of the day. After a few hours, she developed severe vaginal irritation. She then showered and use a scented Dove soap which further exacerbated her pain. She states she has a burning sensation mostly over the labia and inguinal region. She reports associated sensation of swelling. She also endorses burning sensation inside the vagina and she is now having yellow discharge. Denies redness or open wounds over the vagina. She has tried applying warm compresses without relief. She has tried no other over-the-counter medications. Endorses dysuria. Denies concern for STD exposure. Denies fevers, chills, nausea or vomiting.   Past Medical History  Diagnosis Date  . Asthma    History reviewed. No pertinent past surgical history. No family history on file. Social History  Substance Use Topics  . Smoking status: Former Smoker -- 0.50 packs/day    Types: Cigarettes  . Smokeless tobacco: None  . Alcohol Use: No     Comment: occasionally    OB History    Gravida Para Term Preterm AB TAB SAB Ectopic Multiple Living   1              Review of Systems  All other systems reviewed and are negative.     Allergies  Tomato  Home Medications   Prior to Admission medications   Medication Sig Start Date End Date Taking? Authorizing Provider  cyclobenzaprine (FLEXERIL) 10 MG tablet Take 10 mg by mouth 3 (three) times daily as needed for muscle spasms.   Yes Historical Provider, MD  ibuprofen (ADVIL,MOTRIN) 600 MG tablet Take 1 tablet (600 mg total) by mouth every 6 (six) hours as  needed. Patient not taking: Reported on 03/07/2016 02/04/16   Dorathy Kinsman, CNM  metroNIDAZOLE (FLAGYL) 500 MG tablet Take 1 tablet (500 mg total) by mouth 2 (two) times daily. 03/07/16   Yamel Bale, PA-C  naproxen (NAPROSYN) 500 MG tablet Take 1 tablet (500 mg total) by mouth 2 (two) times daily. 03/07/16   Rolm Gala Tarvaris Puglia, PA-C  predniSONE (DELTASONE) 50 MG tablet Take one tablet once a day for 5 days 03/07/16   Malick Netz, PA-C   BP 123/92 mmHg  Pulse 95  Temp(Src) 98.5 F (36.9 C) (Oral)  Resp 20  SpO2 100%  LMP  (LMP Unknown) Physical Exam  Constitutional: She appears well-developed and well-nourished. No distress.  HENT:  Head: Normocephalic and atraumatic.  Eyes: Conjunctivae are normal. Right eye exhibits no discharge. Left eye exhibits no discharge. No scleral icterus.  Neck: Normal range of motion.  Cardiovascular: Normal rate and regular rhythm.   Pulmonary/Chest: Effort normal. No respiratory distress.  Abdominal: Soft. She exhibits no distension. There is no tenderness.  Genitourinary:    There is tenderness on the right labia. There is no rash or lesion on the right labia. There is tenderness on the left labia. There is no rash or lesion on the left labia. There is tenderness in the vagina. No bleeding in the vagina. No signs of injury around the vagina. Vaginal discharge found.  Exam chaperoned by  med tech. Tenderness to palpation over the labia and inguinal canals. No obvious swelling. Area of erythema and skin cracking noted to left labia. No purulent drainage or fluctuance. No vesicles or pustules. Malodorous vaginal discharge present. Vaginal mucosa is pink without ulceration or bleeding.   Musculoskeletal: Normal range of motion.  Neurological: She is alert. Coordination normal.  Skin: Skin is warm and dry.  Psychiatric: She has a normal mood and affect. Her behavior is normal.  Nursing note and vitals reviewed.   ED Course  Procedures (including critical care  time) Labs Review Labs Reviewed  WET PREP, GENITAL - Abnormal; Notable for the following:    Trich, Wet Prep PRESENT (*)    WBC, Wet Prep HPF POC MANY (*)    All other components within normal limits  URINALYSIS, ROUTINE W REFLEX MICROSCOPIC (NOT AT [facility]) - Abnormal; Notable for the following:    Color, Urine AMBER (*)    APPearance HAZY (*)    Bilirubin Urine SMALL (*)    Ketones, ur 15 (*)    Protein, ur 30 (*)    Leukocytes, UA MODERATE (*)    All other components within normal limits  URINE MICROSCOPIC-ADD ON - Abnormal; Notable for the following:    Squamous Epithelial / LPF 6-30 (*)    Bacteria, UA FEW (*)    All other components within normal limits  URINE CULTURE  PREGNANCY, URINE  POC URINE PREG, ED  GC/CHLAMYDIA PROBE AMP ([place]) NOT AT [facility]    Imaging Review No results found. I have personally reviewed and evaluated these images and lab results as part of my medical decision-making.   EKG Interpretation None      MDM   Final diagnoses:  Contact dermatitis  Trichomonas vaginalis (TV) infection   20 year old female presenting with vaginal irritation after applying deodorant and dove soap to the area 2 days ago. Also complains of vaginal discharge. Afebrile and hemodynamicly stable. Patient appears uncomfortable. There is erythema and irritation of the labia consistent with a contact dermatitis. Malodorous vaginal discharge on exam. No cervical motion tenderness or adnexal tenderness. Urinalysis is not suggestive of UTI. Wet prep positive for Trichomonas. Discussed patient with on-call OB/GYN who recommends prednisone burst as opposed to topical steroids. Will discharge with 5 days prednisone and Flagyl. Patient is to follow-up with women's outpatient clinic as needed. Discussed signs of infection and other reasons to return to the emergency room. Patient states understanding and is stable for discharge.    Alveta HeimlichStevi Cambreigh Dearing, PA-C 03/07/16 2300   Benjiman CoreNathan  Pickering, MD 03/13/16 [phone]

## 2016-03-07 NOTE — Discharge Instructions (Signed)
Contact Dermatitis Dermatitis is redness, soreness, and swelling (inflammation) of the skin. Contact dermatitis is a reaction to certain substances that touch the skin. There are two types of contact dermatitis:   Irritant contact dermatitis. This type is caused by something that irritates your skin, such as dry hands from washing them too much. This type does not require previous exposure to the substance for a reaction to occur. This type is more common.  Allergic contact dermatitis. This type is caused by a substance that you are allergic to, such as a nickel allergy or poison ivy. This type only occurs if you have been exposed to the substance (allergen) before. Upon a repeat exposure, your body reacts to the substance. This type is less common. CAUSES  Many different substances can cause contact dermatitis. Irritant contact dermatitis is most commonly caused by exposure to:   Makeup.   Soaps.   Detergents.   Bleaches.   Acids.   Metal salts, such as nickel.  Allergic contact dermatitis is most commonly caused by exposure to:   Poisonous plants.   Chemicals.   Jewelry.   Latex.   Medicines.   Preservatives in products, such as clothing.  RISK FACTORS This condition is more likely to develop in:   People who have jobs that expose them to irritants or allergens.  People who have certain medical conditions, such as asthma or eczema.  SYMPTOMS  Symptoms of this condition may occur anywhere on your body where the irritant has touched you or is touched by you. Symptoms include:  Dryness or flaking.   Redness.   Cracks.   Itching.   Pain or a burning feeling.   Blisters.  Drainage of small amounts of blood or clear fluid from skin cracks. With allergic contact dermatitis, there may also be swelling in areas such as the eyelids, mouth, or genitals.  DIAGNOSIS  This condition is diagnosed with a medical history and physical exam. A patch skin test  may be performed to help determine the cause. If the condition is related to your job, you may need to see an occupational medicine specialist. TREATMENT Treatment for this condition includes figuring out what caused the reaction and protecting your skin from further contact. Treatment may also include:   Steroid creams or ointments. Oral steroid medicines may be needed in more severe cases.  Antibiotics or antibacterial ointments, if a skin infection is present.  Antihistamine lotion or an antihistamine taken by mouth to ease itching.  A bandage (dressing). HOME CARE INSTRUCTIONS Skin Care  Moisturize your skin as needed.   Apply cool compresses to the affected areas.  Try taking a bath with:  Epsom salts. Follow the instructions on the packaging. You can get these at your local pharmacy or grocery store.  Baking soda. Pour a small amount into the bath as directed by your health care provider.  Colloidal oatmeal. Follow the instructions on the packaging. You can get this at your local pharmacy or grocery store.  Try applying baking soda paste to your skin. Stir water into baking soda until it reaches a paste-like consistency.  Do not scratch your skin.  Bathe less frequently, such as every other day.  Bathe in lukewarm water. Avoid using hot water. Medicines  Take or apply over-the-counter and prescription medicines only as told by your health care provider.   If you were prescribed an antibiotic medicine, take or apply your antibiotic as told by your health care provider. Do not stop using the  antibiotic even if your condition starts to improve. General Instructions  Keep all follow-up visits as told by your health care provider. This is important.  Avoid the substance that caused your reaction. If you do not know what caused it, keep a journal to try to track what caused it. Write down:  What you eat.  What cosmetic products you use.  What you drink.  What  you wear in the affected area. This includes jewelry.  If you were given a dressing, take care of it as told by your health care provider. This includes when to change and remove it. SEEK MEDICAL CARE IF:   Your condition does not improve with treatment.  Your condition gets worse.  You have signs of infection such as swelling, tenderness, redness, soreness, or warmth in the affected area.  You have a fever.  You have new symptoms. SEEK IMMEDIATE MEDICAL CARE IF:   You have a severe headache, neck pain, or neck stiffness.  You vomit.  You feel very sleepy.  You notice red streaks coming from the affected area.  Your bone or joint underneath the affected area becomes painful after the skin has healed.  The affected area turns darker.  You have difficulty breathing.   This information is not intended to replace advice given to you by your health care provider. Make sure you discuss any questions you have with your health care provider.   Document Released: 08/02/2000 Document Revised: 04/26/2015 Document Reviewed: 12/21/2014 Elsevier Interactive Patient Education 2016 [employer]  Trichomoniasis Trichomoniasis is an infection caused by an organism called Trichomonas. The infection can affect both women and men. In women, the outer female genitalia and the vagina are affected. In men, the penis is mainly affected, but the prostate and other reproductive organs can also be involved. Trichomoniasis is a sexually transmitted infection (STI) and is most often passed to another person through sexual contact.  RISK FACTORS  Having unprotected sexual intercourse.  Having sexual intercourse with an infected partner. SIGNS AND SYMPTOMS  Symptoms of trichomoniasis in women include:  Abnormal gray-green frothy vaginal discharge.  Itching and irritation of the vagina.  Itching and irritation of the area outside the vagina. Symptoms of trichomoniasis in men include:   Penile  discharge with or without pain.  Pain during urination. This results from inflammation of the urethra. DIAGNOSIS  Trichomoniasis may be found during a Pap test or physical exam. Your health care provider may use one of the following methods to help diagnose this infection:  Testing the pH of the vagina with a test tape.  Using a vaginal swab test that checks for the Trichomonas organism. A test is available that provides results within a few minutes.  Examining a urine sample.  Testing vaginal secretions. Your health care provider may test you for other STIs, including HIV. TREATMENT   You may be given medicine to fight the infection. Women should inform their health care provider if they could be or are pregnant. Some medicines used to treat the infection should not be taken during pregnancy.  Your health care provider may recommend over-the-counter medicines or creams to decrease itching or irritation.  Your sexual partner will need to be treated if infected.  Your health care provider may test you for infection again 3 months after treatment. HOME CARE INSTRUCTIONS   Take medicines only as directed by your health care provider.  Take over-the-counter medicine for itching or irritation as directed by your health care provider.  Do not have sexual intercourse while you have the infection.  Women should not douche or wear tampons while they have the infection.  Discuss your infection with your partner. Your partner may have gotten the infection from you, or you may have gotten it from your partner.  Have your sex partner get examined and treated if necessary.  Practice safe, informed, and protected sex.  See your health care provider for other STI testing. SEEK MEDICAL CARE IF:   You still have symptoms after you finish your medicine.  You develop abdominal pain.  You have pain when you urinate.  You have bleeding after sexual intercourse.  You develop a  rash.  Your medicine makes you sick or makes you throw up (vomit). MAKE SURE YOU:  Understand these instructions.  Will watch your condition.  Will get help right away if you are not doing well or get worse.   This information is not intended to replace advice given to you by your health care provider. Make sure you discuss any questions you have with your health care provider.   Document Released: 01/29/2001 Document Revised: 08/26/2014 Document Reviewed: 05/17/2013 Elsevier Interactive Patient Education [employer]

## 2016-03-07 NOTE — ED Notes (Signed)
Pt states she was hot the other day and opted to use some deodorant on her vagina to help with sweating and odor.  Since then she has developed vaginal irritation and pain.  She reports swelling or some type of growth below her vagina.  She states it has been draining today and the pain has decreased some.  She denies any fever or any other symptoms at this time.  She reports a miscarriage June 18, but wants to be tested for pregnancy today. 

## 2016-03-08 LAB — GC/CHLAMYDIA PROBE AMP (~~LOC~~) NOT AT ARMC
CHLAMYDIA, DNA PROBE: NEGATIVE
NEISSERIA GONORRHEA: NEGATIVE

## 2016-03-09 LAB — URINE CULTURE

## 2016-03-10 ENCOUNTER — Telehealth (HOSPITAL_BASED_OUTPATIENT_CLINIC_OR_DEPARTMENT_OTHER): Payer: Self-pay

## 2016-03-10 NOTE — Telephone Encounter (Signed)
Post ED Visit - Positive Culture Follow-up  Culture report reviewed by antimicrobial stewardship pharmacist:  []  Enzo Bi, Pharm.D. []  Celedonio Miyamoto, [address].D., BCPS []  Garvin Fila, Pharm.D. []  Georgina Pillion, Pharm.D., BCPS []  [place], [address].D., BCPS, AAHIVP []  Estella Husk, Pharm.D., BCPS, AAHIVP []  Tennis Must, Pharm.D. []  Sherle Poe, [place].D. Casilda Carls Pharm D Positive urine culture Treated with Metronidazole, organism sensitive to the same and no further patient follow-up is required at this time.  Jerry Caras 03/10/2016, 2:07 PM

## 2016-03-24 ENCOUNTER — Encounter (HOSPITAL_COMMUNITY): Payer: Self-pay | Admitting: *Deleted

## 2016-03-24 ENCOUNTER — Inpatient Hospital Stay (HOSPITAL_COMMUNITY): Payer: Medicaid Other

## 2016-03-24 ENCOUNTER — Inpatient Hospital Stay (HOSPITAL_COMMUNITY)
Admission: AD | Admit: 2016-03-24 | Discharge: 2016-03-24 | Disposition: A | Discharge status: Home / Self Care | Payer: Medicaid Other | Source: Ambulatory Visit | Attending: Obstetrics & Gynecology | Admitting: Obstetrics & Gynecology

## 2016-03-24 DIAGNOSIS — O219 Vomiting of pregnancy, unspecified: Secondary | ICD-10-CM | POA: Diagnosis not present

## 2016-03-24 DIAGNOSIS — Z87891 Personal history of nicotine dependence: Secondary | ICD-10-CM | POA: Diagnosis not present

## 2016-03-24 DIAGNOSIS — J45909 Unspecified asthma, uncomplicated: Secondary | ICD-10-CM | POA: Insufficient documentation

## 2016-03-24 DIAGNOSIS — O99511 Diseases of the respiratory system complicating pregnancy, first trimester: Secondary | ICD-10-CM | POA: Insufficient documentation

## 2016-03-24 DIAGNOSIS — O26891 Other specified pregnancy related conditions, first trimester: Secondary | ICD-10-CM | POA: Diagnosis not present

## 2016-03-24 DIAGNOSIS — O26899 Other specified pregnancy related conditions, unspecified trimester: Secondary | ICD-10-CM

## 2016-03-24 DIAGNOSIS — N939 Abnormal uterine and vaginal bleeding, unspecified: Secondary | ICD-10-CM | POA: Diagnosis present

## 2016-03-24 DIAGNOSIS — R109 Unspecified abdominal pain: Secondary | ICD-10-CM | POA: Diagnosis not present

## 2016-03-24 DIAGNOSIS — Z3A01 Less than 8 weeks gestation of pregnancy: Secondary | ICD-10-CM | POA: Insufficient documentation

## 2016-03-24 LAB — URINALYSIS, ROUTINE W REFLEX MICROSCOPIC
BILIRUBIN URINE: NEGATIVE
GLUCOSE, UA: NEGATIVE mg/dL
HGB URINE DIPSTICK: NEGATIVE
Ketones, ur: NEGATIVE mg/dL
Nitrite: NEGATIVE
PH: 6 (ref 5.0–8.0)
Protein, ur: NEGATIVE mg/dL
SPECIFIC GRAVITY, URINE: 1.02 (ref 1.005–1.030)

## 2016-03-24 LAB — POCT PREGNANCY, URINE: Preg Test, Ur: POSITIVE — AB

## 2016-03-24 LAB — URINE MICROSCOPIC-ADD ON

## 2016-03-24 LAB — HCG, QUANTITATIVE, PREGNANCY: hCG, Beta Chain, Quant, S: 37432 m[IU]/mL — ABNORMAL HIGH (ref <5)

## 2016-03-24 MED ORDER — PRENATAL PLUS 27-1 MG PO TABS: 1.0000 | ORAL_TABLET | Freq: Every day | ORAL | 0 refills | Status: DC

## 2016-03-24 MED ORDER — PROMETHAZINE HCL 12.5 MG PO TABS: 12.5000 mg | ORAL_TABLET | Freq: Four times a day (QID) | ORAL | 0 refills | Status: DC | PRN

## 2016-03-24 NOTE — MAU Provider Note (Signed)
History     CSN: [id number]  Arrival date and time: 03/24/16 8119   First Provider Initiated Contact with Patient 03/24/16 1952      No chief complaint on file.  Sue Graves is a 20 y.o. G2P0010 who had a failed pregnancy of unknown location at 5 wks with vaginal bleeding 6/14-18/2017. On 6/18 quantitative beta hCG had dropped to 10 from high of 29 on 6/14. Since then she had no further bleeding and no follow-up quant. She now presents with positive HPT and pelvic cramping. She rates the pain 3/10.  7/20 diagnosed with trich; she and partner treated and have abstained since. She has had intermittent nausea, no vomiting.      OB History  Gravida Para Term Preterm AB Living  2            SAB TAB Ectopic Multiple Live Births               # Outcome Date GA Lbr Len/2nd Weight Sex Delivery Anes PTL Lv  2 Current           1 Gravida                 Past Medical History:  Diagnosis Date  . Asthma     History reviewed. No pertinent surgical history.  No family history on file.  Social History  Substance Use Topics  . Smoking status: Former Smoker    Packs/day: 0.50    Types: Cigarettes  . Smokeless tobacco: Never Used  . Alcohol use No     Comment: occasionally     Allergies:  Allergies  Allergen Reactions  . Tomato Hives and Swelling    Prescriptions Prior to Admission  Medication Sig Dispense Refill Last Dose  . cyclobenzaprine (FLEXERIL) 10 MG tablet Take 10 mg by mouth 3 (three) times daily as needed for muscle spasms.   last month  . ibuprofen (ADVIL,MOTRIN) 600 MG tablet Take 1 tablet (600 mg total) by mouth every 6 (six) hours as needed. (Patient not taking: Reported on 03/07/2016) 30 tablet 1   . metroNIDAZOLE (FLAGYL) 500 MG tablet Take 1 tablet (500 mg total) by mouth 2 (two) times daily. 14 tablet 0   . naproxen (NAPROSYN) 500 MG tablet Take 1 tablet (500 mg total) by mouth 2 (two) times daily. 30 tablet 0   . predniSONE (DELTASONE) 50 MG tablet Take  one tablet once a day for 5 days 5 tablet 0     Review of Systems  Constitutional: Negative for fever.  Genitourinary: Negative for dysuria, frequency and urgency.       Denies vaginal irritation or itch and took Flagyl as directed  Neurological: Negative for headaches.   Physical Exam   Blood pressure 117/77, pulse 76, temperature 98.4 F (36.9 C), temperature source Oral, resp. rate 16, height  (1.499 m), weight 42.2 kg (93 lb), SpO2 100 %, unknown if currently breastfeeding.  Physical Exam  Nursing note and vitals reviewed. Constitutional: She is oriented to person, place, and time. She appears well-developed and well-nourished. No distress.  HENT:  Head: Normocephalic.  Eyes: Pupils are equal, round, and reactive to light.  Neck: Normal range of motion.  Cardiovascular: Normal rate.   Respiratory: Effort normal.  GI: Soft. There is no tenderness.  Genitourinary:  Genitourinary Comments: Pelvic declined/deferred  Musculoskeletal: Normal range of motion.  Neurological: She is alert and oriented to person, place, and time.  Skin: Skin is warm and dry.  Psychiatric:  She has a normal mood and affect. Her behavior is normal.    MAU Course  Procedures Prelim US: Viable IUP 6w 1d, HR 129, small SCH,    Assessment and Plan  Viable IUP 8174w1d 1. Abdominal pain during pregnancy   2. Vomiting or nausea of pregnancy    .   Medication List    STOP taking these medications   cyclobenzaprine 10 MG tablet Commonly known as:  FLEXERIL   ibuprofen 600 MG tablet Commonly known as:  ADVIL,MOTRIN   metroNIDAZOLE 500 MG tablet Commonly known as:  FLAGYL   naproxen 500 MG tablet Commonly known as:  NAPROSYN   predniSONE 50 MG tablet Commonly known as:  DELTASONE     TAKE these medications   prenatal vitamin w/FE, FA 27-1 MG Tabs tablet Take 1 tablet by mouth daily.   promethazine 12.5 MG tablet Commonly known as:  PHENERGAN Take 1 tablet (12.5 mg total) by mouth  every 6 (six) hours as needed for nausea or vomiting.      Follow-up Information    [facility]. Schedule an appointment as soon as possible for a visit in 1 week(s).   Contact information: [address] [place] [address] [phone]          . Jivan Symanski 03/24/2016, 8:11 PM

## 2016-03-24 NOTE — MAU Note (Signed)
Pt reports she was trying to see if she was pregnant, had a miscarriage on 06/18 and got a call last week that she needed to return for blood work but she couldn't make it up here and she has had 3 positive home preg test in the last 3 days.

## 2016-03-24 NOTE — Discharge Instructions (Signed)

## 2016-04-11 ENCOUNTER — Inpatient Hospital Stay (HOSPITAL_COMMUNITY)
Admission: AD | Admit: 2016-04-11 | Discharge: 2016-04-12 | Disposition: A | Discharge status: Home / Self Care | Payer: Medicaid Other | Source: Ambulatory Visit | Attending: Obstetrics and Gynecology | Admitting: Obstetrics and Gynecology

## 2016-04-11 DIAGNOSIS — O021 Missed abortion: Secondary | ICD-10-CM | POA: Insufficient documentation

## 2016-04-11 DIAGNOSIS — Z79899 Other long term (current) drug therapy: Secondary | ICD-10-CM | POA: Diagnosis not present

## 2016-04-11 DIAGNOSIS — Z87891 Personal history of nicotine dependence: Secondary | ICD-10-CM | POA: Diagnosis not present

## 2016-04-11 DIAGNOSIS — J45909 Unspecified asthma, uncomplicated: Secondary | ICD-10-CM | POA: Insufficient documentation

## 2016-04-11 DIAGNOSIS — Z888 Allergy status to other drugs, medicaments and biological substances status: Secondary | ICD-10-CM | POA: Insufficient documentation

## 2016-04-11 DIAGNOSIS — O26891 Other specified pregnancy related conditions, first trimester: Secondary | ICD-10-CM | POA: Diagnosis present

## 2016-04-11 DIAGNOSIS — R102 Pelvic and perineal pain: Secondary | ICD-10-CM | POA: Diagnosis present

## 2016-04-11 DIAGNOSIS — N898 Other specified noninflammatory disorders of vagina: Secondary | ICD-10-CM | POA: Diagnosis not present

## 2016-04-11 NOTE — MAU Note (Signed)
Pt reports she has been having pain in her lower abd and having vaginal irritation, itching and a yellow discharge. Also reports while she was sitting in her lobby she hit her head on the phone charging stand.

## 2016-04-12 ENCOUNTER — Encounter: Payer: Self-pay | Admitting: *Deleted

## 2016-04-12 ENCOUNTER — Inpatient Hospital Stay (HOSPITAL_COMMUNITY): Payer: Medicaid Other

## 2016-04-12 DIAGNOSIS — O021 Missed abortion: Secondary | ICD-10-CM

## 2016-04-12 LAB — URINE MICROSCOPIC-ADD ON

## 2016-04-12 LAB — GC/CHLAMYDIA PROBE AMP (~~LOC~~) NOT AT ARMC
Chlamydia: NEGATIVE
Neisseria Gonorrhea: NEGATIVE

## 2016-04-12 LAB — WET PREP, GENITAL
Clue Cells Wet Prep HPF POC: NONE SEEN
Sperm: NONE SEEN
TRICH WET PREP: NONE SEEN
YEAST WET PREP: NONE SEEN

## 2016-04-12 LAB — URINALYSIS, ROUTINE W REFLEX MICROSCOPIC
BILIRUBIN URINE: NEGATIVE
Glucose, UA: NEGATIVE mg/dL
KETONES UR: NEGATIVE mg/dL
Leukocytes, UA: NEGATIVE
NITRITE: NEGATIVE
Protein, ur: NEGATIVE mg/dL
Specific Gravity, Urine: >1.03 — ABNORMAL HIGH (ref 1.005–1.030)
pH: 6 (ref 5.0–8.0)

## 2016-04-12 MED ORDER — IBUPROFEN 800 MG PO TABS: 800.0000 mg | ORAL_TABLET | Freq: Three times a day (TID) | ORAL | 0 refills | Status: DC | PRN

## 2016-04-12 NOTE — Discharge Instructions (Signed)
Miscarriage  A miscarriage is the sudden loss of an unborn baby (fetus) before the 20th week of pregnancy. Most miscarriages happen in the first 3 months of pregnancy. Sometimes, it happens before a woman even knows she is pregnant. A miscarriage is also called a "spontaneous miscarriage" or "early pregnancy loss." Having a miscarriage can be an emotional experience. Talk with your caregiver about any questions you may have about miscarrying, the grieving process, and your future pregnancy plans.  CAUSES    Problems with the fetal chromosomes that make it impossible for the baby to develop normally. Problems with the baby's genes or chromosomes are most often the result of errors that occur, by chance, as the embryo divides and grows. The problems are not inherited from the parents.   Infection of the cervix or uterus.    Hormone problems.    Problems with the cervix, such as having an incompetent cervix. This is when the tissue in the cervix is not strong enough to hold the pregnancy.    Problems with the uterus, such as an abnormally shaped uterus, uterine fibroids, or congenital abnormalities.    Certain medical conditions.    Smoking, drinking alcohol, or taking illegal drugs.    Trauma.   Often, the cause of a miscarriage is unknown.   SYMPTOMS    Vaginal bleeding or spotting, with or without cramps or pain.   Pain or cramping in the abdomen or lower back.   Passing fluid, tissue, or blood clots from the vagina.  DIAGNOSIS   Your caregiver will perform a physical exam. You may also have an ultrasound to confirm the miscarriage. Blood or urine tests may also be ordered.  TREATMENT    Sometimes, treatment is not necessary if you naturally pass all the fetal tissue that was in the uterus. If some of the fetus or placenta remains in the body (incomplete miscarriage), tissue left behind may become infected and must be removed. Usually, a dilation and curettage (D and C) procedure is performed.  During a D and C procedure, the cervix is widened (dilated) and any remaining fetal or placental tissue is gently removed from the uterus.   Antibiotic medicines are prescribed if there is an infection. Other medicines may be given to reduce the size of the uterus (contract) if there is a lot of bleeding.   If you have Rh negative blood and your baby was Rh positive, you will need a Rh immunoglobulin shot. This shot will protect any future baby from having Rh blood problems in future pregnancies.  HOME CARE INSTRUCTIONS    Your caregiver may order bed rest or may allow you to continue light activity. Resume activity as directed by your caregiver.   Have someone help with home and family responsibilities during this time.    Keep track of the number of sanitary pads you use each day and how soaked (saturated) they are. Write down this information.    Do not use tampons. Do not douche or have sexual intercourse until approved by your caregiver.    Only take over-the-counter or prescription medicines for pain or discomfort as directed by your caregiver.    Do not take aspirin. Aspirin can cause bleeding.    Keep all follow-up appointments with your caregiver.    If you or your partner have problems with grieving, talk to your caregiver or seek counseling to help cope with the pregnancy loss. Allow enough time to grieve before trying to get pregnant again.     SEEK IMMEDIATE MEDICAL CARE IF:    You have severe cramps or pain in your back or abdomen.   You have a fever.   You pass large blood clots (walnut-sized or larger) ortissue from your vagina. Save any tissue for your caregiver to inspect.    Your bleeding increases.    You have a thick, bad-smelling vaginal discharge.   You become lightheaded, weak, or you faint.    You have chills.   MAKE SURE YOU:   Understand these instructions.   Will watch your condition.   Will get help right away if you are not doing well or get worse.     This  information is not intended to replace advice given to you by your health care provider. Make sure you discuss any questions you have with your health care provider.     Document Released: 01/29/2001 Document Revised: 11/30/2012 Document Reviewed: 09/24/2011  Elsevier Interactive Patient Education 2016 Elsevier Inc.

## 2016-04-12 NOTE — MAU Provider Note (Signed)
History     CSN: [id number]  Arrival date and time: 04/11/16 2326   First Provider Initiated Contact with Patient 04/12/16 0028      Chief Complaint  Patient presents with  . Pelvic Pain   Pelvic Pain  The patient's primary symptoms include pelvic pain and vaginal discharge. This is a new problem. The current episode started in the past 7 days. The problem occurs intermittently. The problem has been unchanged. Pain severity now: 5/10  The problem affects both sides. She is pregnant. Associated symptoms include abdominal pain. Pertinent negatives include no chills, constipation, diarrhea, dysuria, fever, frequency, nausea, urgency or vomiting. The vaginal discharge was yellow. There has been no bleeding. Exacerbated by: Patient thinks that her soap is making the dischagre worse.  She has tried nothing for the symptoms. She is sexually active. Her menstrual history has been regular (LMP 02/04/16 ).     Past Medical History:  Diagnosis Date  . Asthma     No past surgical history on file.  No family history on file.  Social History  Substance Use Topics  . Smoking status: Former Smoker    Packs/day: 0.50    Types: Cigarettes  . Smokeless tobacco: Never Used  . Alcohol use No     Comment: occasionally     Allergies:  Allergies  Allergen Reactions  . Tomato Hives and Swelling    Prescriptions Prior to Admission  Medication Sig Dispense Refill Last Dose  . prenatal vitamin w/FE, FA (PRENATAL 1 + 1) 27-1 MG TABS tablet Take 1 tablet by mouth daily. 30 each 0   . promethazine (PHENERGAN) 12.5 MG tablet Take 1 tablet (12.5 mg total) by mouth every 6 (six) hours as needed for nausea or vomiting. 30 tablet 0     Review of Systems  Constitutional: Negative for chills and fever.  Gastrointestinal: Positive for abdominal pain. Negative for constipation, diarrhea, nausea and vomiting.  Genitourinary: Positive for pelvic pain and vaginal discharge. Negative for dysuria, frequency  and urgency.   Physical Exam   Blood pressure 133/81, pulse 102, temperature 98.7 F (37.1 C), temperature source Oral, resp. rate 17, height 4\' 11"  (1.499 m), weight 101 lb (45.8 kg), SpO2 98 %, unknown if currently breastfeeding.  Physical Exam  Nursing note and vitals reviewed. Constitutional: She is oriented to person, place, and time. She appears well-developed and well-nourished. No distress.  HENT:  Head: Normocephalic.  Cardiovascular: Normal rate.   Respiratory: Effort normal.  GI: Soft. There is no tenderness. There is no rebound.  Neurological: She is alert and oriented to person, place, and time.  Skin: Skin is warm and dry.  Psychiatric: She has a normal mood and affect.   Results for orders placed or performed during the hospital encounter of 04/11/16 (from the past 24 hour(s))  Urinalysis, Routine w reflex microscopic (not [facility])     Status: Abnormal   Collection Time: 04/11/16 11:45 PM  Result Value Ref Range   Color, Urine YELLOW YELLOW   APPearance CLEAR CLEAR   Specific Gravity, Urine >1.030 (H) 1.005 - 1.030   pH 6.0 5.0 - 8.0   Glucose, UA NEGATIVE NEGATIVE mg/dL   Hgb urine dipstick SMALL (A) NEGATIVE   Bilirubin Urine NEGATIVE NEGATIVE   Ketones, ur NEGATIVE NEGATIVE mg/dL   Protein, ur NEGATIVE NEGATIVE mg/dL   Nitrite NEGATIVE NEGATIVE   Leukocytes, UA NEGATIVE NEGATIVE  Urine microscopic-add on     Status: Abnormal   Collection Time: 04/11/16 11:45 PM  Result Value Ref Range   Squamous Epithelial / LPF 0-5 (A) NONE SEEN   WBC, UA 0-5 0 - 5 WBC/hpf   RBC / HPF 6-30 0 - 5 RBC/hpf   Bacteria, UA FEW (A) NONE SEEN   Crystals CA OXALATE CRYSTALS (A) NEGATIVE   Urine-Other MUCOUS PRESENT   Wet prep, genital     Status: Abnormal   Collection Time: 04/12/16 12:30 AM  Result Value Ref Range   Yeast Wet Prep HPF POC NONE SEEN NONE SEEN   Trich, Wet Prep NONE SEEN NONE SEEN   Clue Cells Wet Prep HPF POC NONE SEEN NONE SEEN   WBC, Wet Prep HPF POC MANY  (A) NONE SEEN   Sperm NONE SEEN    US Ob Transvaginal  Result Date: 04/12/2016 CLINICAL DATA:  Pelvic pain in first-trimester pregnancy. EXAM: TRANSVAGINAL OB ULTRASOUND TECHNIQUE: Transvaginal ultrasound was performed for complete evaluation of the gestation as well as the maternal uterus, adnexal regions, and pelvic cul-de-sac. COMPARISON:  Obstetrical ultrasound 03/24/2016 FINDINGS: Intrauterine gestational sac: Single Yolk sac:  Present Embryo:  Present Cardiac Activity: Absent CRL:   22.3  mm   8 w 5 d                  [place] EDC: 11/17/2016 Subchorionic hemorrhage:  Small to moderate. Maternal uterus/adnexae: Both ovaries are visualized and normal. No pelvic free fluid. IMPRESSION: Intrauterine pregnancy with absent fetal heart tones. Findings meet definitive criteria for failed pregnancy. This follows SRU consensus guidelines: Diagnostic Criteria for Nonviable Pregnancy Early in the First Trimester. Macy Mis J Med [phone]. Electronically Signed   By: Rubye Oaks M.D.   On: 04/12/2016 01:06   MAU Course  Procedures  MDM Cannot see FHR with bedside US will get formal transvaginal US.   Reviewed [place] results with the patient. Discussed at length options including expectant management or cyotoc. R/B reviewed, and questions answered. Patient would like to proceed with expectant management. Will make close interval FU, in one week, to assess bleeding.    Assessment and Plan   1. Missed abortion    DC home Comfort measures reviewed  Bleeding precautions RX: ibuprofen 800mg  PRN #[id number]  Return to MAU as needed  Follow-up Information    [facility] for [facility] .   Specialty:  Obstetrics and Gynecology Why:  They will call you for an appointment  Contact information: [address] [place] [address] [phone]           Tawnya Crook 04/12/2016, 12:29 AM

## 2016-04-16 ENCOUNTER — Inpatient Hospital Stay (HOSPITAL_COMMUNITY)
Admission: AD | Admit: 2016-04-16 | Discharge: 2016-04-17 | Disposition: A | Discharge status: Home / Self Care | Payer: Medicaid Other | Source: Ambulatory Visit | Attending: Obstetrics and Gynecology | Admitting: Obstetrics and Gynecology

## 2016-04-16 DIAGNOSIS — B3731 Acute candidiasis of vulva and vagina: Secondary | ICD-10-CM

## 2016-04-16 DIAGNOSIS — B373 Candidiasis of vulva and vagina: Secondary | ICD-10-CM | POA: Insufficient documentation

## 2016-04-16 DIAGNOSIS — N898 Other specified noninflammatory disorders of vagina: Secondary | ICD-10-CM | POA: Diagnosis present

## 2016-04-16 NOTE — MAU Note (Signed)
Pt presents complaining of increased vaginal discharge that is thick and green. Denies pain.

## 2016-04-17 DIAGNOSIS — B373 Candidiasis of vulva and vagina: Secondary | ICD-10-CM | POA: Diagnosis not present

## 2016-04-17 LAB — WET PREP, GENITAL
Clue Cells Wet Prep HPF POC: NONE SEEN
SPERM: NONE SEEN
TRICH WET PREP: NONE SEEN

## 2016-04-17 MED ORDER — FLUCONAZOLE 150 MG PO TABS
150.0000 mg | ORAL_TABLET | Freq: Once | ORAL | Status: AC
  Administered 2016-04-17: 150 mg via ORAL
  Filled 2016-04-17: qty 1

## 2016-04-17 NOTE — Discharge Instructions (Signed)
Monilial Vaginitis Vaginitis in a soreness, swelling and redness (inflammation) of the vagina and vulva. Monilial vaginitis is not a sexually transmitted infection. CAUSES  Yeast vaginitis is caused by yeast (candida) that is normally found in your vagina. With a yeast infection, the candida has overgrown in number to a point that upsets the chemical balance. SYMPTOMS   White, thick vaginal discharge.  Swelling, itching, redness and irritation of the vagina and possibly the lips of the vagina (vulva).  Burning or painful urination.  Painful intercourse. DIAGNOSIS  Things that may contribute to monilial vaginitis are:  Postmenopausal and virginal states.  Pregnancy.  Infections.  Being tired, sick or stressed, especially if you had monilial vaginitis in the past.  Diabetes. Good control will help lower the chance.  Birth control pills.  Tight fitting garments.  Using bubble bath, feminine sprays, douches or deodorant tampons.  Taking certain medications that kill germs (antibiotics).  Sporadic recurrence can occur if you become ill. TREATMENT  Your caregiver will give you medication.  There are several kinds of anti monilial vaginal creams and suppositories specific for monilial vaginitis. For recurrent yeast infections, use a suppository or cream in the vagina 2 times a week, or as directed.  Anti-monilial or steroid cream for the itching or irritation of the vulva may also be used. Get your caregiver's permission.  Painting the vagina with methylene blue solution may help if the monilial cream does not work.  Eating yogurt may help prevent monilial vaginitis. HOME CARE INSTRUCTIONS   Finish all medication as prescribed.  Do not have sex until treatment is completed or after your caregiver tells you it is okay.  Take warm sitz baths.  Do not douche.  Do not use tampons, especially scented ones.  Wear cotton underwear.  Avoid tight pants and panty  hose.  Tell your sexual partner that you have a yeast infection. They should go to their caregiver if they have symptoms such as mild rash or itching.  Your sexual partner should be treated as well if your infection is difficult to eliminate.  Practice safer sex. Use condoms.  Some vaginal medications cause latex condoms to fail. Vaginal medications that harm condoms are:  Cleocin cream.  Butoconazole (Femstat).  Terconazole (Terazol) vaginal suppository.  Miconazole (Monistat) (may be purchased over the counter). SEEK MEDICAL CARE IF:   You have a temperature by mouth above 102 F (38.9 C).  The infection is getting worse after 2 days of treatment.  The infection is not getting better after 3 days of treatment.  You develop blisters in or around your vagina.  You develop vaginal bleeding, and it is not your menstrual period.  You have pain when you urinate.  You develop intestinal problems.  You have pain with sexual intercourse.   This information is not intended to replace advice given to you by your health care provider. Make sure you discuss any questions you have with your health care provider.   Document Released: 05/15/2005 Document Revised: 10/28/2011 Document Reviewed: 02/06/2015 Elsevier Interactive Patient Education [employer]2016 Elsevier Inc.  Miscarriage A miscarriage is the loss of an unborn baby (fetus) before the 20th week of pregnancy. The cause is often unknown.  HOME CARE  You may need to stay in bed (bed rest), or you may be able to do light activity. Go about activity as told by your doctor.  Have help at home.  Write down how many pads you use each day. Write down how soaked they are.  Do not use tampons. Do not wash out your vagina (douche) or have sex (intercourse) until your doctor approves.  Only take medicine as told by your doctor.  Do not take aspirin.  Keep all doctor visits as told.  If you or your partner have problems with  grieving, talk to your doctor. You can also try counseling. Give yourself time to grieve before trying to get pregnant again. GET HELP RIGHT AWAY IF:  You have bad cramps or pain in your back or belly (abdomen).  You have a fever.  You pass large clumps of blood (clots) from your vagina that are walnut-sized or larger. Save the clumps for your doctor to see.  You pass large amounts of tissue from your vagina. Save the tissue for your doctor to see.  You have more bleeding.  You have thick, bad-smelling fluid (discharge) coming from the vagina.  You get lightheaded, weak, or you pass out (faint).  You have chills. MAKE SURE YOU:  Understand these instructions.  Will watch your condition.  Will get help right away if you are not doing well or get worse.   This information is not intended to replace advice given to you by your health care provider. Make sure you discuss any questions you have with your health care provider.   Document Released: 10/28/2011 Document Reviewed: 10/28/2011 Elsevier Interactive Patient Education [employer]

## 2016-04-18 LAB — GC/CHLAMYDIA PROBE AMP (~~LOC~~) NOT AT ARMC
Chlamydia: NEGATIVE
Neisseria Gonorrhea: NEGATIVE

## 2016-04-23 ENCOUNTER — Encounter: Payer: Self-pay | Admitting: Family Medicine

## 2016-04-23 ENCOUNTER — Ambulatory Visit (INDEPENDENT_AMBULATORY_CARE_PROVIDER_SITE_OTHER): Payer: Medicaid Other | Admitting: Family Medicine

## 2016-04-23 ENCOUNTER — Encounter: Payer: Self-pay | Admitting: *Deleted

## 2016-04-23 VITALS — BP 123/86 | HR 90 | Wt 97.7 lb

## 2016-04-23 DIAGNOSIS — O3680X0 Pregnancy with inconclusive fetal viability, not applicable or unspecified: Secondary | ICD-10-CM

## 2016-04-23 DIAGNOSIS — O021 Missed abortion: Secondary | ICD-10-CM

## 2016-04-23 LAB — CBC
HCT: 37.9 % (ref 35.0–45.0)
Hemoglobin: 13 g/dL (ref 11.7–15.5)
MCH: 32.6 pg (ref 27.0–33.0)
MCHC: 34.3 g/dL (ref 32.0–36.0)
MCV: 95 fL (ref 80.0–100.0)
MPV: 9.7 fL (ref 7.5–12.5)
Platelets: 286 10*3/uL (ref 140–400)
RBC: 3.99 MIL/uL (ref 3.80–5.10)
RDW: 13.6 % (ref 11.0–15.0)
WBC: 6.5 10*3/uL (ref 3.8–10.8)

## 2016-04-23 MED ORDER — MISOPROSTOL 200 MCG PO TABS: 800.0000 ug | ORAL_TABLET | Freq: Once | ORAL | 0 refills | Status: DC

## 2016-04-23 MED ORDER — OXYCODONE-ACETAMINOPHEN 5-325 MG PO TABS: 1.0000 | ORAL_TABLET | Freq: Four times a day (QID) | ORAL | 0 refills | Status: DC | PRN

## 2016-04-23 NOTE — Progress Notes (Signed)
CLINIC ENCOUNTER NOTE  History:  20 y.o. G2P0010 here today for missed abortion.   Patient was seen in MAU multiple times, was found to have a miscarriage on 04/12/16, where a YS and embyro ([redacted]w[redacted]d) present, but cardiac activity was absent. Previously, 03/24/16 showed YS, embryo ([redacted]w[redacted]d), +cardiac activity.   Currently no vaginal discharge. Has not had any menses or VB or any passage of tissue. Last time had any VB was from last miscarriage in February 04 2016.   Patient is refusing all forms of birth control. Does not care if she gets pregnant, desires pregnancy at some point, but not actively trying. Also states PNV make her sick and does not want to take.   Past Medical History:  Diagnosis Date  . Asthma     No past surgical history on file.  The following portions of the patient's history were reviewed and updated as appropriate: allergies, current medications, past family history, past medical history, past social history, past surgical history and problem list.     Review of Systems:  See above; comprehensive review of systems was otherwise negative.  Objective:  Physical Exam BP 123/86 (BP Location: Left Arm, Patient Position: Sitting, Cuff Size: Small)   Pulse 90   Wt 97 lb 11.2 oz (44.3 kg)   LMP  (LMP Unknown)   Breastfeeding? No   BMI 19.73 kg/m  CONSTITUTIONAL: Well-developed, well-nourished female in no acute distress.  HENT:  Normocephalic, atraumatic SKIN: Skin is warm and dry.  NEUROLGIC: Alert  PSYCHIATRIC: Normal mood and affect.  CARDIOVASCULAR: Normal heart rate noted RESPIRATORY: Effort and breath sounds normal, no problems with respiration noted ABDOMEN: Soft, no distention noted.  No tenderness, rebound or guarding.    Labs and Imaging US Ob Comp Less 14 Wks  Result Date: 03/24/2016 CLINICAL DATA:  Patient with abdominal cramping. Positive pregnancy test. EXAM: OBSTETRIC <14 WK [place] AND TRANSVAGINAL OB US TECHNIQUE: Both transabdominal and transvaginal  ultrasound examinations were performed for complete evaluation of the gestation as well as the maternal uterus, adnexal regions, and pelvic cul-de-sac. Transvaginal technique was performed to assess early pregnancy. COMPARISON:  Pelvic ultrasound 01/31/2016 FINDINGS: Intrauterine gestational sac: Present Yolk sac:  Present Embryo:  Present Cardiac Activity: Present Heart Rate: 126  bpm CRL:  4.2  mm   6 w   1 d                  [place] EDC: 11/15/2016 Subchorionic hemorrhage:  Moderate subchorionic hemorrhage. Maternal uterus/adnexae: Normal right and left ovaries. No free fluid the pelvis. IMPRESSION: Signal live intrauterine gestation. Moderate subchorionic hemorrhage. Electronically Signed   By: Annia Belt M.D.   On: 03/24/2016 21:25   US Ob Transvaginal  Result Date: 04/12/2016 CLINICAL DATA:  Pelvic pain in first-trimester pregnancy. EXAM: TRANSVAGINAL OB ULTRASOUND TECHNIQUE: Transvaginal ultrasound was performed for complete evaluation of the gestation as well as the maternal uterus, adnexal regions, and pelvic cul-de-sac. COMPARISON:  Obstetrical ultrasound 03/24/2016 FINDINGS: Intrauterine gestational sac: Single Yolk sac:  Present Embryo:  Present Cardiac Activity: Absent CRL:   22.3  mm   8 w 5 d                  [place] EDC: 11/17/2016 Subchorionic hemorrhage:  Small to moderate. Maternal uterus/adnexae: Both ovaries are visualized and normal. No pelvic free fluid. IMPRESSION: Intrauterine pregnancy with absent fetal heart tones. Findings meet definitive criteria for failed pregnancy. This follows SRU consensus guidelines: Diagnostic Criteria for Nonviable Pregnancy Early in  the First Trimester. Macy Mis Engl J Med [phone];369:1443-51. Electronically Signed   By: Rubye OaksMelanie  Ehinger M.D.   On: 04/12/2016 01:06   [place] Ob Transvaginal  Result Date: 03/24/2016 CLINICAL DATA:  Patient with abdominal cramping. Positive pregnancy test. EXAM: OBSTETRIC <14 WK US AND TRANSVAGINAL OB US TECHNIQUE: Both transabdominal and  transvaginal ultrasound examinations were performed for complete evaluation of the gestation as well as the maternal uterus, adnexal regions, and pelvic cul-de-sac. Transvaginal technique was performed to assess early pregnancy. COMPARISON:  Pelvic ultrasound 01/31/2016 FINDINGS: Intrauterine gestational sac: Present Yolk sac:  Present Embryo:  Present Cardiac Activity: Present Heart Rate: 126  bpm CRL:  4.2  mm   6 w   1 d                  US EDC: 11/15/2016 Subchorionic hemorrhage:  Moderate subchorionic hemorrhage. Maternal uterus/adnexae: Normal right and left ovaries. No free fluid the pelvis. IMPRESSION: Signal live intrauterine gestation. Moderate subchorionic hemorrhage. Electronically Signed   By: Annia Beltrew  Davis M.D.   On: 03/24/2016 21:25    Assessment & Plan:   1. Missed abortion - Reviewed US reports, no passage of clots, blood, or tissue since. Discussed options (expectant mgmt, medical induction, procedure), patient elected to induce missed AB with medication first. - misoprostol (CYTOTEC) 200 MCG tablet; Place 4 tablets (800 mcg total) vaginally once.  Dispense: 4 tablet; Refill: 0 - oxyCODONE-acetaminophen (PERCOCET/ROXICET) 5-325 MG tablet; Take 1 tablet by mouth every 6 (six) hours as needed for severe pain.  Dispense: 10 tablet; Refill: 0 - CBC - Type and screen; Future  2. Pregnancy of unknown anatomic location - B-HCG Quant   Routine preventative health maintenance measures emphasized.     Jen MowElizabeth Pristine Gladhill, DO OB/GYN Fellow [facility]    Early Intrauterine Pregnancy Failure  _X_  Documented intrauterine pregnancy failure less than or equal to [redacted] weeks gestation  _X_  No serious current illness  _X_  Baseline Hgb greater than or equal to 10g/dl  _X_  Patient has easily accessible transportation to the hospital  _X_  Clear preference  _X_  Practitioner/physician deems patient reliable  _X_  Counseling by practitioner  or physician  ___  Patient education by RN  ___  Consent form signed  ___  Rho-Gam given by RN if indicated  _X_ Medication dispensed   _X_   Cytotec 800 mcg _X_   Intravaginally by patient at home    _X_  Ibuprofen 600 mg 1 tablet by mouth every 6 hours as needed #30 (Given previously, at pharmacy, has not filled)  _X_  Hydrocodone/acetaminophen 5/325 mg by mouth every 4 to 6 hours as needed    _X_  Phenergan 12.5 mg by mouth every 4 hours as needed for nausea (Given previously, at pharmacy, has not filled)

## 2016-04-23 NOTE — Patient Instructions (Signed)
Prostaglandin-Induced Abortion (Miscarriage), Care After Refer to this sheet in the next few weeks. These instructions provide you with information on caring for yourself after your procedure. Your health care provider may also give you more specific instructions. Your treatment has been planned according to current medical practices, but problems sometimes occur. Call your health care provider if you have any problems or questions after your procedure. WHAT TO EXPECT AFTER THE PROCEDURE  You may have cramps for a few days. They may feel like menstrual cramps.  You may bleed for a few hours or a few days. It may feel like you are having a heavy period.  You may have a headache, diarrhea, or chills for 1-2 days.  You may feel tired and depressed.  Your next menstrual period will most likely start 4-6 weeks after the procedure, unless your health care provider has started you on birth control pills. HOME CARE INSTRUCTIONS   Only take over-the-counter or prescription medicines as directed by your health care provider.  Only take the medicines your health care provider recommends. Do not take aspirin. It can cause bleeding.  Keep track of how many menstrual pads you use each day and how soaked they are. Write down this information.  Rest and avoid strenuous activity for 2-3 weeks.  Do not have sexual intercourse for 2-3 weeks or until your health care provider says it is okay.  Do not douche or use tampons.  Ask your health care provider when you can start using birth control (contraception).  Keep all your follow-up appointments. SEEK MEDICAL CARE IF:  You have chills or fever.  You need to change your pad more often than every 2-4 hours.  You continue to have pain.  You have vaginal drainage.  You have pain or bleeding that gets worse. SEEK IMMEDIATE MEDICAL CARE IF:   You have very bad cramps in your stomach, back, or abdomen.  You have a fever.  There are large blood  clots or tissue coming out of your vagina. Save any tissue for your health care provider to inspect.  You need to change your pad every hour or more than once in an hour.  You become light-headed, weak, or faint. MAKE SURE YOU:  Understand these instructions.  Will watch your condition.  Will get help right away if you are not doing well or get worse.   This information is not intended to replace advice given to you by your health care provider. Make sure you discuss any questions you have with your health care provider.   Document Released: 08/10/2013 Document Reviewed: 08/10/2013 Elsevier Interactive Patient Education [employer]2016 Elsevier Inc.

## 2016-04-24 LAB — ABO AND RH: Rh Type: POSITIVE

## 2016-04-24 LAB — HCG, QUANTITATIVE, PREGNANCY: HCG, BETA CHAIN, QUANT, S: 28658.9 m[IU]/mL — AB

## 2016-04-25 ENCOUNTER — Other Ambulatory Visit: Payer: Medicaid Other

## 2016-04-26 ENCOUNTER — Other Ambulatory Visit: Payer: Medicaid Other

## 2016-04-30 ENCOUNTER — Encounter (HOSPITAL_COMMUNITY): Payer: Self-pay | Admitting: Obstetrics & Gynecology

## 2016-04-30 ENCOUNTER — Inpatient Hospital Stay (HOSPITAL_COMMUNITY): Payer: Medicaid Other

## 2016-04-30 ENCOUNTER — Inpatient Hospital Stay (HOSPITAL_COMMUNITY)
Admission: AD | Admit: 2016-04-30 | Discharge: 2016-04-30 | Disposition: A | Discharge status: Home / Self Care | Payer: Medicaid Other | Source: Ambulatory Visit | Attending: Obstetrics & Gynecology | Admitting: Obstetrics & Gynecology

## 2016-04-30 ENCOUNTER — Telehealth: Payer: Self-pay

## 2016-04-30 ENCOUNTER — Ambulatory Visit: Payer: Medicaid Other | Admitting: Advanced Practice Midwife

## 2016-04-30 DIAGNOSIS — R109 Unspecified abdominal pain: Secondary | ICD-10-CM | POA: Insufficient documentation

## 2016-04-30 DIAGNOSIS — Z87891 Personal history of nicotine dependence: Secondary | ICD-10-CM | POA: Diagnosis not present

## 2016-04-30 DIAGNOSIS — O021 Missed abortion: Secondary | ICD-10-CM | POA: Diagnosis not present

## 2016-04-30 LAB — CBC
HEMATOCRIT: 32.1 % — AB (ref 36.0–46.0)
HEMOGLOBIN: 11.6 g/dL — AB (ref 12.0–15.0)
MCH: 32.7 pg (ref 26.0–34.0)
MCHC: 36.1 g/dL — ABNORMAL HIGH (ref 30.0–36.0)
MCV: 90.4 fL (ref 78.0–100.0)
Platelets: 248 10*3/uL (ref 150–400)
RBC: 3.55 MIL/uL — AB (ref 3.87–5.11)
RDW: 13.2 % (ref 11.5–15.5)
WBC: 16.2 10*3/uL — AB (ref 4.0–10.5)

## 2016-04-30 MED ORDER — KETOROLAC TROMETHAMINE 60 MG/2ML IM SOLN
60.0000 mg | INTRAMUSCULAR | Status: AC
  Administered 2016-04-30: 60 mg via INTRAMUSCULAR
  Filled 2016-04-30: qty 2

## 2016-04-30 MED ORDER — DOXYCYCLINE HYCLATE 100 MG IV SOLR
200.0000 mg | INTRAVENOUS | Status: AC
  Administered 2016-05-01: 100 mg via INTRAVENOUS
  Filled 2016-04-30: qty 200

## 2016-04-30 MED ORDER — HYDROMORPHONE HCL 1 MG/ML IJ SOLN
2.0000 mg | INTRAMUSCULAR | Status: AC
  Administered 2016-04-30: 2 mg via INTRAMUSCULAR
  Filled 2016-04-30: qty 2

## 2016-04-30 NOTE — Telephone Encounter (Signed)
Pt call transferred from the front desk.  Pt stated that she was upset that she felt like no one is taking care of her during her miscarriage.  "I have had another miscarriage and I don't remember it being like this".  "They gave me  some pills to take, now it's been 3-4 days with this cramping and pain and nothing is happening."  Pt also stated "I had an US that showed my baby did have a heart beat and no one has taken my blood since."  I explained to the pt that the medication she took Cytotec can cause cramping so that it can get rid of the products of conception so that was normal.  I stated that it also allows the body to get of products on its own before going to possible surgery.  I informed pt that she had two blood draws for her beta levels that showed a decrease but in between she had the US.  I asked pt if she was bleeding, pt stated "no, I am not bleeding".  Pt then stated that she has heard different things from the different providers. "The day they did my US the person said they saw the heartbeat but then they didn't".    I explained to the pt that I understand why she would be upset but to know that the providers are taking the best care of her.  I informed her that she has an appt scheduled on 05/08/16 for follow up.   I asked pt if she is wanting to have a D&C.  Pt call was disconnected attempted to return call and then phone kept ringing.

## 2016-04-30 NOTE — Discharge Instructions (Signed)
Dilation and Curettage or Vacuum Curettage  Dilation and curettage (D&C) and vacuum curettage are minor procedures. A D&C involves stretching (dilation) the cervix and scraping (curettage) the inside lining of the womb (uterus). During a D&C, tissue is gently scraped from the inside lining of the uterus. During a vacuum curettage, the lining and tissue in the uterus are removed with the use of gentle suction.   Curettage may be performed to either diagnose or treat a problem. As a diagnostic procedure, curettage is performed to examine tissues from the uterus. A diagnostic curettage may be performed for the following symptoms:   · Irregular bleeding in the uterus.    · Bleeding with the development of clots.    · Spotting between menstrual periods.    · Prolonged menstrual periods.    · Bleeding after menopause.    · No menstrual period (amenorrhea).    · A change in size and shape of the uterus.    As a treatment procedure, curettage may be performed for the following reasons:   · Removal of an IUD (intrauterine device).    · Removal of retained placenta after giving birth. Retained placenta can cause an infection or bleeding severe enough to require transfusions.    · Abortion.    · Miscarriage.    · Removal of polyps inside the uterus.    · Removal of uncommon types of noncancerous lumps (fibroids).    LET YOUR HEALTH CARE PROVIDER KNOW ABOUT:   · Any allergies you have.    · All medicines you are taking, including vitamins, herbs, eye drops, creams, and over-the-counter medicines.    · Previous problems you or members of your family have had with the use of anesthetics.    · Any blood disorders you have.    · Previous surgeries you have had.    · Medical conditions you have.  RISKS AND COMPLICATIONS   Generally, this is a safe procedure. However, as with any procedure, complications can occur. Possible complications include:  · Excessive bleeding.    · Infection of the uterus.    · Damage to the cervix.     · Development of scar tissue (adhesions) inside the uterus, later causing abnormal amounts of menstrual bleeding.    · Complications from the general anesthetic, if a general anesthetic is used.    · Putting a hole (perforation) in the uterus. This is rare.    BEFORE THE PROCEDURE   · Eat and drink before the procedure only as directed by your health care provider.    · Arrange for someone to take you home.    PROCEDURE   This procedure usually takes about 15-30 minutes.  · You will be given one of the following:    A medicine that numbs the area in and around the cervix (local anesthetic).      A medicine to make you sleep through the procedure (general anesthetic).  · You will lie on your back with your legs in stirrups.    · A warm metal or plastic instrument (speculum) will be placed in your vagina to keep it open and to allow the health care provider to see the cervix.  · There are two ways in which your cervix can be softened and dilated. These include:      Taking a medicine.      Having thin rods (laminaria) inserted into your cervix.    · A curved tool (curette) will be used to scrape cells from the inside lining of the uterus. In some cases, gentle suction is applied with the curette. The curette will then be removed.    AFTER THE PROCEDURE   ·   You will rest in the recovery area until you are stable and are ready to go home.    · You may feel sick to your stomach (nauseous) or throw up (vomit) if you were given a general anesthetic.    · You may have a sore throat if a tube was placed in your throat during general anesthesia.    · You may have light cramping and bleeding. This may last for 2 days to 2 weeks after the procedure.    · Your uterus needs to make a new lining after the procedure. This may make your next period late.     This information is not intended to replace advice given to you by your health care provider. Make sure you discuss any questions you have with your health care provider.      Document Released: 08/05/2005 Document Revised: 04/07/2013 Document Reviewed: 03/04/2013  Elsevier Interactive Patient Education ©2016 Elsevier Inc.

## 2016-04-30 NOTE — MAU Note (Addendum)
PT  SAYS ABD PAIN   AFTER TAKING  PILLS   ON 9-9-  AFTER TOLD IN CLINIC-  BABY HAD  NO FHR-  SO GAVE  HER  ABORTION  PILLS.    TOOK OXYCODINE   FOR  PAIN-  AT  730PM-  NO RELIEF      BLEEDING  STARTED   ON 9-9.

## 2016-04-30 NOTE — MAU Provider Note (Signed)
History     CSN: [id number]  Arrival date and time: 04/30/16 1955   None     Chief Complaint  Patient presents with  . Abdominal Pain   HPI 20 y.o. G2P0010 with recently diagnosed [redacted]w[redacted]d missed abortion on 04/12/16, here with extreme abdominal pain after taking prescribed misoprostol on 04/27/16. She was seen in clinic on 04/24/16 and given the pills, she did not take them until 04/27/16 night and started having some bleeding and pain that night. Pain has gotten increasingly worse, but only had a small amount of bleeding.  Today, it became very debilitating, not allevaited by Percocet. It keeps her doubled over.  Also associated with nausea.  No fevers, chills or other symptoms. Her mother, who brought her in today, is very concerned about ehr level of pain.  Obstetric History   G2   P0   T0   P0   A1   L0    SAB0   TAB0   Ectopic0   Multiple0   Live Births0     # Outcome Date GA Lbr Len/2nd Weight Sex Delivery Anes PTL Lv  2 Gravida           1 SAB               Past Medical History:  Diagnosis Date  . Asthma     No past surgical history on file.  No family history on file.  Social History  Substance Use Topics  . Smoking status: Former Smoker    Packs/day: 0.50    Types: Cigarettes  . Smokeless tobacco: Never Used  . Alcohol use No     Comment: occasionally     Allergies:  Allergies  Allergen Reactions  . Other Itching    Scented soaps/body wash  . Tomato Hives and Swelling    Prescriptions Prior to Admission  Medication Sig Dispense Refill Last Dose  . ibuprofen (ADVIL,MOTRIN) 800 MG tablet Take 1 tablet (800 mg total) by mouth every 8 (eight) hours as needed. (Patient not taking: Reported on 04/23/2016) 30 tablet 0 Not Taking  . misoprostol (CYTOTEC) 200 MCG tablet Place 4 tablets (800 mcg total) vaginally once. 3 tablet 0   . oxyCODONE-acetaminophen (PERCOCET/ROXICET) 5-325 MG tablet Take 1 tablet by mouth every 6 (six) hours as needed for severe pain. 10 tablet 0    . prenatal vitamin w/FE, FA (PRENATAL 1 + 1) 27-1 MG TABS tablet Take 1 tablet by mouth daily. (Patient not taking: Reported on 04/23/2016) 30 each 0 Not Taking  . promethazine (PHENERGAN) 12.5 MG tablet Take 1 tablet (12.5 mg total) by mouth every 6 (six) hours as needed for nausea or vomiting. (Patient not taking: Reported on 04/23/2016) 30 tablet 0 Not Taking    Review of Systems  Constitutional: Negative for chills and fever.  Gastrointestinal: Positive for abdominal pain and nausea. Negative for constipation, diarrhea and vomiting.  Genitourinary: Negative for dysuria and urgency.   Physical Exam   Blood pressure 145/78, pulse 112, temperature 98.3 F (36.8 C), temperature source Oral, resp. rate 20, height 4\' 11"  (1.499 m), weight 99 lb (44.9 kg).  Physical Exam  Constitutional: She is oriented to person, place, and time. She appears well-developed. She appears distressed.  Due to pain  Neck: Normal range of motion.  Cardiovascular: Regular rhythm.   Respiratory: Effort normal and breath sounds normal.  GI:  Patient vehemently refused abdomen and pelvic exam "it hurts too much".  Musculoskeletal: Normal range of motion.  Neurological: She is alert and oriented to person, place, and time.  Skin: Skin is warm and dry.    MAU Course  Procedures  MDM 2025 Toradol 60 mg IM given to patient 2045 Patient taken to ultrasound.  CBC ordered 2122 Dilaudid 2mg  IM x 1 given, patient improved  Ultrasound Preliminary Report: 2.7 cm diffusely, thickened, heterogenous endometrial stripe with some vascular flow concerning for possible retained products of conception. No fetal pole visualized.   CBC Latest Ref Rng & Units 04/30/2016 04/23/2016 02/04/2016  WBC 4.0 - 10.5 K/uL 16.2(H) 6.5 8.8  Hemoglobin 12.0 - 15.0 g/dL 11.6(L) 13.0 12.9  Hematocrit 36.0 - 46.0 % 32.1(L) 37.9 35.9(L)  Platelets 150 - 400 K/uL 248 286 227    Assessment and Plan  Patient with possible retained products of  conception. However, her pain and elevated WBC is concerning for possible infection. Recommended surgical evacuation.  Patient does not want to do this tonight, amenable to do it tomorrow morning.  She was added on to my schedule for D&E at 1100; was told to be be NPO after 0300 and to come to hospital around 0930 tomorrow morning. She will take Ibuprofen and prescribed Percocet prn pain.  She was told to return tonight for fevers, worsening pain, severe bleeding or other concerns.   Tereso NewcomerANYANWU,Sheila Ocasio A, MD 04/30/2016, 8:43 PM

## 2016-05-01 ENCOUNTER — Ambulatory Visit (HOSPITAL_COMMUNITY): Payer: Medicaid Other | Admitting: Anesthesiology

## 2016-05-01 ENCOUNTER — Encounter (HOSPITAL_COMMUNITY): Payer: Self-pay | Admitting: *Deleted

## 2016-05-01 ENCOUNTER — Encounter (HOSPITAL_COMMUNITY)
Admission: AD | Disposition: A | Discharge status: Home / Self Care | Payer: Self-pay | Source: Ambulatory Visit | Attending: Obstetrics & Gynecology

## 2016-05-01 ENCOUNTER — Ambulatory Visit (HOSPITAL_COMMUNITY)
Admission: AD | Admit: 2016-05-01 | Discharge: 2016-05-01 | Disposition: A | Discharge status: Home / Self Care | Payer: Medicaid Other | Source: Ambulatory Visit | Attending: Obstetrics & Gynecology | Admitting: Obstetrics & Gynecology

## 2016-05-01 DIAGNOSIS — F17210 Nicotine dependence, cigarettes, uncomplicated: Secondary | ICD-10-CM | POA: Diagnosis not present

## 2016-05-01 DIAGNOSIS — O034 Incomplete spontaneous abortion without complication: Secondary | ICD-10-CM | POA: Diagnosis not present

## 2016-05-01 DIAGNOSIS — O021 Missed abortion: Secondary | ICD-10-CM | POA: Diagnosis not present

## 2016-05-01 HISTORY — PX: DILATION AND EVACUATION: SHX1459

## 2016-05-01 HISTORY — DX: Major depressive disorder, single episode, unspecified: F32.9

## 2016-05-01 HISTORY — DX: Depression, unspecified: F32.A

## 2016-05-01 HISTORY — DX: Missed abortion: O02.1

## 2016-05-01 SURGERY — DILATION AND EVACUATION, UTERUS
Anesthesia: Monitor Anesthesia Care | Site: Vagina

## 2016-05-01 MED ORDER — BUPIVACAINE HCL (PF) 0.25 % IJ SOLN
INTRAMUSCULAR | Status: AC
  Filled 2016-05-01: qty 30

## 2016-05-01 MED ORDER — DEXAMETHASONE SODIUM PHOSPHATE 4 MG/ML IJ SOLN
INTRAMUSCULAR | Status: DC | PRN
  Administered 2016-05-01: 4 mg via INTRAVENOUS

## 2016-05-01 MED ORDER — MIDAZOLAM HCL 5 MG/5ML IJ SOLN
INTRAMUSCULAR | Status: DC | PRN
  Administered 2016-05-01: 2 mg via INTRAVENOUS

## 2016-05-01 MED ORDER — PROPOFOL 10 MG/ML IV BOLUS
INTRAVENOUS | Status: AC
  Filled 2016-05-01: qty 20

## 2016-05-01 MED ORDER — SCOPOLAMINE 1 MG/3DAYS TD PT72: 1.0000 | MEDICATED_PATCH | Freq: Once | TRANSDERMAL | Status: DC

## 2016-05-01 MED ORDER — ONDANSETRON HCL 4 MG/2ML IJ SOLN
INTRAMUSCULAR | Status: DC | PRN
  Administered 2016-05-01: 4 mg via INTRAVENOUS

## 2016-05-01 MED ORDER — LIDOCAINE HCL (CARDIAC) 20 MG/ML IV SOLN
INTRAVENOUS | Status: AC
  Filled 2016-05-01: qty 5

## 2016-05-01 MED ORDER — ONDANSETRON HCL 4 MG/2ML IJ SOLN
INTRAMUSCULAR | Status: AC
  Filled 2016-05-01: qty 2

## 2016-05-01 MED ORDER — DOCUSATE SODIUM 100 MG PO CAPS: 100.0000 mg | ORAL_CAPSULE | Freq: Two times a day (BID) | ORAL | 2 refills | Status: DC | PRN

## 2016-05-01 MED ORDER — AZITHROMYCIN 250 MG PO TABS
ORAL_TABLET | ORAL | Status: AC
  Filled 2016-05-01: qty 4

## 2016-05-01 MED ORDER — BUPIVACAINE HCL (PF) 0.5 % IJ SOLN
INTRAMUSCULAR | Status: AC
  Filled 2016-05-01: qty 30

## 2016-05-01 MED ORDER — FENTANYL CITRATE (PF) 100 MCG/2ML IJ SOLN: 25.0000 ug | INTRAMUSCULAR | Status: DC | PRN

## 2016-05-01 MED ORDER — LIDOCAINE 2% (20 MG/ML) 5 ML SYRINGE
INTRAMUSCULAR | Status: DC | PRN
  Administered 2016-05-01 (×2): 50 mg via INTRAVENOUS

## 2016-05-01 MED ORDER — FENTANYL CITRATE (PF) 100 MCG/2ML IJ SOLN
INTRAMUSCULAR | Status: DC | PRN
Start: 2016-05-01 — End: 2016-05-01
  Administered 2016-05-01 (×2): 50 ug via INTRAVENOUS

## 2016-05-01 MED ORDER — PROPOFOL 10 MG/ML IV BOLUS
INTRAVENOUS | Status: DC | PRN
  Administered 2016-05-01: 30 mg via INTRAVENOUS
  Administered 2016-05-01: 40 mg via INTRAVENOUS
  Administered 2016-05-01: 50 mg via INTRAVENOUS
  Administered 2016-05-01: 30 mg via INTRAVENOUS
  Administered 2016-05-01: 50 mg via INTRAVENOUS

## 2016-05-01 MED ORDER — GLYCOPYRROLATE 0.2 MG/ML IJ SOLN
INTRAMUSCULAR | Status: AC
  Filled 2016-05-01: qty 1

## 2016-05-01 MED ORDER — KETOROLAC TROMETHAMINE 30 MG/ML IJ SOLN
INTRAMUSCULAR | Status: AC
  Filled 2016-05-01: qty 1

## 2016-05-01 MED ORDER — DEXAMETHASONE SODIUM PHOSPHATE 4 MG/ML IJ SOLN
INTRAMUSCULAR | Status: AC
  Filled 2016-05-01: qty 2

## 2016-05-01 MED ORDER — FENTANYL CITRATE (PF) 100 MCG/2ML IJ SOLN
INTRAMUSCULAR | Status: AC
  Filled 2016-05-01: qty 2

## 2016-05-01 MED ORDER — KETOROLAC TROMETHAMINE 30 MG/ML IJ SOLN
INTRAMUSCULAR | Status: DC | PRN
  Administered 2016-05-01: 30 mg via INTRAVENOUS

## 2016-05-01 MED ORDER — BUPIVACAINE HCL 0.5 % IJ SOLN
INTRAMUSCULAR | Status: DC | PRN
  Administered 2016-05-01: 30 mL

## 2016-05-01 MED ORDER — PROMETHAZINE HCL 25 MG/ML IJ SOLN: 6.2500 mg | INTRAMUSCULAR | Status: DC | PRN

## 2016-05-01 MED ORDER — LACTATED RINGERS IV SOLN: INTRAVENOUS | Status: DC

## 2016-05-01 MED ORDER — LACTATED RINGERS IV SOLN
INTRAVENOUS | Status: DC
  Administered 2016-05-01 (×2): via INTRAVENOUS

## 2016-05-01 MED ORDER — AZITHROMYCIN 250 MG PO TABS
1000.0000 mg | ORAL_TABLET | Freq: Once | ORAL | Status: AC
  Administered 2016-05-01: 1000 mg via ORAL

## 2016-05-01 MED ORDER — IBUPROFEN 800 MG PO TABS: 800.0000 mg | ORAL_TABLET | Freq: Three times a day (TID) | ORAL | 2 refills | Status: DC | PRN

## 2016-05-01 MED ORDER — MIDAZOLAM HCL 2 MG/2ML IJ SOLN
INTRAMUSCULAR | Status: AC
  Filled 2016-05-01: qty 2

## 2016-05-01 MED ORDER — OXYCODONE-ACETAMINOPHEN 5-325 MG PO TABS: 1.0000 | ORAL_TABLET | Freq: Four times a day (QID) | ORAL | 0 refills | Status: DC | PRN

## 2016-05-01 SURGICAL SUPPLY — 19 items
CATH ROBINSON RED A/P 16FR (CATHETERS) ×3 IMPLANT
CLOTH BEACON ORANGE TIMEOUT ST (SAFETY) ×3 IMPLANT
DECANTER SPIKE VIAL GLASS SM (MISCELLANEOUS) ×3 IMPLANT
GLOVE BIOGEL PI IND STRL 7.0 (GLOVE) ×3 IMPLANT
GLOVE BIOGEL PI INDICATOR 7.0 (GLOVE) ×6
GLOVE ECLIPSE 7.0 STRL STRAW (GLOVE) ×3 IMPLANT
GOWN STRL REUS W/TWL LRG LVL3 (GOWN DISPOSABLE) ×6 IMPLANT
KIT BERKELEY 1ST TRIMESTER 3/8 (MISCELLANEOUS) ×3 IMPLANT
NS IRRIG 1000ML POUR BTL (IV SOLUTION) ×3 IMPLANT
PACK VAGINAL MINOR WOMEN LF (CUSTOM PROCEDURE TRAY) ×3 IMPLANT
PAD OB MATERNITY 4.3X12.25 (PERSONAL CARE ITEMS) ×3 IMPLANT
PAD PREP 24X48 CUFFED NSTRL (MISCELLANEOUS) ×3 IMPLANT
SET BERKELEY SUCTION TUBING (SUCTIONS) ×3 IMPLANT
TOWEL OR 17X24 6PK STRL BLUE (TOWEL DISPOSABLE) ×6 IMPLANT
VACURETTE 10 RIGID CVD (CANNULA) IMPLANT
VACURETTE 6 ASPIR F TIP BERK (CANNULA) IMPLANT
VACURETTE 7MM CVD STRL WRAP (CANNULA) ×3 IMPLANT
VACURETTE 8 RIGID CVD (CANNULA) IMPLANT
VACURETTE 9 RIGID CVD (CANNULA) IMPLANT

## 2016-05-01 NOTE — H&P (Signed)
Preoperative History and Physical  Sue Graves is a 20 y.o. G2P0010 here for surgical management of retained products of conception s/p recent misoprostol management of 9 week MAB. Was seen in MAU last night, situation was concerning for possible infection given extreme pain and WBC of 16.  No other significant preoperative concerns.  Proposed surgery: Dilation and Evacuation  Past Medical History:  Diagnosis Date  . Asthma    as a child , no inhaler  . Depression    history - no meds  . Missed abortion    no surgery required   Past Surgical History:  Procedure Laterality Date  . WISDOM TOOTH EXTRACTION     OB History  Gravida Para Term Preterm AB Living  2       1    SAB TAB Ectopic Multiple Live Births  1            # Outcome Date GA Lbr Len/2nd Weight Sex Delivery Anes PTL Lv  2 Gravida           1 SAB             Patient denies any other pertinent gynecologic issues.   No current facility-administered medications on file prior to encounter.    Current Outpatient Prescriptions on File Prior to Encounter  Medication Sig Dispense Refill  . oxyCODONE-acetaminophen (PERCOCET/ROXICET) 5-325 MG tablet Take 1 tablet by mouth every 6 (six) hours as needed for severe pain. 10 tablet 0  . ibuprofen (ADVIL,MOTRIN) 800 MG tablet Take 1 tablet (800 mg total) by mouth every 8 (eight) hours as needed. (Patient not taking: Reported on 05/01/2016) 30 tablet 0  . promethazine (PHENERGAN) 12.5 MG tablet Take 1 tablet (12.5 mg total) by mouth every 6 (six) hours as needed for nausea or vomiting. (Patient not taking: Reported on 05/01/2016) 30 tablet 0   Allergies  Allergen Reactions  . Other Itching    Scented soaps/body wash  . Tomato Hives and Swelling    Social History:   reports that she has been smoking Cigarettes.  She has a 0.75 pack-year smoking history. She has never used smokeless tobacco. She reports that she uses drugs, including Marijuana. She reports that she does not drink  alcohol.  No family history on file.  Review of Systems: Noncontributory  PHYSICAL EXAM: Blood pressure 121/84, pulse (!) 104, temperature 99.7 F (37.6 C), temperature source Oral, resp. rate 16, SpO2 100 %. CONSTITUTIONAL: Well-developed, well-nourished female in no acute distress.  HENT:  Normocephalic, atraumatic, External right and left ear normal. Oropharynx is clear and moist EYES: Conjunctivae and EOM are normal. Pupils are equal, round, and reactive to light. No scleral icterus.  NECK: Normal range of motion, supple, no masses SKIN: Skin is warm and dry. No rash noted. Not diaphoretic. No erythema. No pallor. NEUROLOGIC: Alert and oriented to person, place, and time. Normal reflexes, muscle tone coordination. No cranial nerve deficit noted. PSYCHIATRIC: Normal mood and affect. Normal behavior. Normal judgment and thought content. CARDIOVASCULAR: Tachycardic, regular rhythm RESPIRATORY: Effort and breath sounds normal, no problems with respiration noted ABDOMEN: Soft, nondistended. Patient refuses palpation of abdomen secondary to pain. PELVIC: Deferred MUSCULOSKELETAL: Normal range of motion. No edema and no tenderness. 2+ distal pulses.  Labs: Results for orders placed or performed during the hospital encounter of 04/30/16 (from the past 336 hour(s))  CBC   Collection Time: 04/30/16  9:17 PM  Result Value Ref Range   WBC 16.2 (H) 4.0 - 10.5 K/uL  RBC 3.55 (L) 3.87 - 5.11 MIL/uL   Hemoglobin 11.6 (L) 12.0 - 15.0 g/dL   HCT 40.932.1 (L) 81.136.0 - 91.446.0 %   MCV 90.4 78.0 - 100.0 fL   MCH 32.7 26.0 - 34.0 pg   MCHC 36.1 (H) 30.0 - 36.0 g/dL   RDW 78.213.2 95.611.5 - 21.315.5 %   Platelets 248 150 - 400 K/uL  Results for orders placed or performed in visit on 04/23/16 (from the past 336 hour(s))  CBC   Collection Time: 04/23/16 11:32 AM  Result Value Ref Range   WBC 6.5 3.8 - 10.8 K/uL   RBC 3.99 3.80 - 5.10 MIL/uL   Hemoglobin 13.0 11.7 - 15.5 g/dL   HCT 08.637.9 57.835.0 - 46.945.0 %   MCV 95.0  80.0 - 100.0 fL   MCH 32.6 27.0 - 33.0 pg   MCHC 34.3 32.0 - 36.0 g/dL   RDW 62.913.6 52.811.0 - 41.315.0 %   Platelets 286 140 - 400 K/uL   MPV 9.7 7.5 - 12.5 fL  B-HCG Quant   Collection Time: 04/23/16 11:32 AM  Result Value Ref Range   hCG, Beta Chain, Quant, S 28,658.9 (H) mIU/mL  ABO AND RH    Collection Time: 04/23/16 11:35 AM  Result Value Ref Range   ABO Grouping B    Rh Type POS     Imaging Studies: [place] Ob Transvaginal  Result Date: 04/30/2016 CLINICAL DATA:  Incomplete abortion. Misoprostol recently minister. Increased pain. EXAM: TRANSVAGINAL OB ULTRASOUND TECHNIQUE: Transvaginal ultrasound was performed for complete evaluation of the gestation as well as the maternal uterus, adnexal regions, and pelvic cul-de-sac. COMPARISON:  04/12/2016 FINDINGS: Limited examination due to patient pain. Intrauterine gestational sac:  None visualized. Maternal uterus/adnexae: Thick, heterogeneous endometrium measuring 28 mm, with decreased echogenicity and increased vascularity posteriorly near the fundus. Adnexa were not well evaluated. IMPRESSION: Heterogeneous endometrium with increased vascularity suspicious for retained products of conception. Electronically Signed   By: Sebastian AcheAllen  Grady M.D.   On: 04/30/2016 21:08   [place] Ob Transvaginal  Result Date: 04/12/2016 CLINICAL DATA:  Pelvic pain in first-trimester pregnancy. EXAM: TRANSVAGINAL OB ULTRASOUND TECHNIQUE: Transvaginal ultrasound was performed for complete evaluation of the gestation as well as the maternal uterus, adnexal regions, and pelvic cul-de-sac. COMPARISON:  Obstetrical ultrasound 03/24/2016 FINDINGS: Intrauterine gestational sac: Single Yolk sac:  Present Embryo:  Present Cardiac Activity: Absent CRL:   22.3  mm   8 w 5 d                  US EDC: 11/17/2016 Subchorionic hemorrhage:  Small to moderate. Maternal uterus/adnexae: Both ovaries are visualized and normal. No pelvic free fluid. IMPRESSION: Intrauterine pregnancy with absent fetal heart  tones. Findings meet definitive criteria for failed pregnancy. This follows SRU consensus guidelines: Diagnostic Criteria for Nonviable Pregnancy Early in the First Trimester. Macy Mis Engl J Med [phone];369:[id number]. Electronically Signed   By: Rubye OaksMelanie  Ehinger M.D.   On: 04/12/2016 01:06    Assessment: Patient Active Problem List   Diagnosis Date Noted  . Retained products of conception, early pregnancy 05/01/2016    Plan: Patient will undergo surgical management with D&E for possible septic retained products.  Risks of surgery including bleeding, infection, injury to surrounding organs, need for additional procedures, possibility of intrauterine scarring which may impair future fertility, risk of retained products which may require further management and other postoperative/anesthesia complications were explained to patient.  Likelihood of success of complete evacuation of the uterus was discussed with the  patient.  Written informed consent was obtained.  Patient has been NPO since last night  she will remain NPO for procedure. Anesthesia and OR aware.  Preoperative prophylactic Doxycycline 200mg  IV  has been ordered and is on call to the OR.  To OR when ready.   Jaynie Collins, M.D. 05/01/2016 10:54 AM

## 2016-05-01 NOTE — Op Note (Signed)
Sue Graves PROCEDURE DATE: 05/01/2016  PREOPERATIVE DIAGNOSES: Retained products of conception after misoprostol administration for [id number] missed abortion; concern about possible intrauterine infection POSTOPERATIVE DIAGNOSIS: The same PROCEDURE:     Dilation and Evacuation SURGEON:  Dr. Jaynie CollinsUgonna Amron Guerrette  INDICATIONS: 20 y.o. G2P0010 with diagnoses as above, needing surgical completion.  Risks of surgery were discussed with the patient including but not limited to: bleeding which may require transfusion; infection which may require antibiotics; injury to uterus or surrounding organs; need for additional procedures including laparotomy or laparoscopy; possibility of intrauterine scarring which may impair future fertility; and other postoperative/anesthesia complications. Written informed consent was obtained.    FINDINGS:  A 9 week size uterus, a significant amount of products of conception, specimen sent to pathology.  ANESTHESIA:    Monitored intravenous sedation, paracervical block. INTRAVENOUS FLUIDS:  1200 ml of LR ESTIMATED BLOOD LOSS:  50 ml. SPECIMENS:  Products of conception sent to pathology COMPLICATIONS:  None immediate.  PROCEDURE DETAILS:  The patient received intravenous Doxycycline while in the preoperative area.  She was then taken to the operating room where monitored intravenous sedation was administered and was found to be adequate.  After an adequate timeout was performed, she was placed in the dorsal lithotomy position and examined; then prepped and draped in the sterile manner.   Her bladder was catheterized for an unmeasured amount of clear, yellow urine. A vaginal speculum was then placed in the patient's vagina and a single tooth tenaculum was applied to the anterior lip of the cervix.  A paracervical block using 30 ml of 0.5% Marcaine was administered. The cervix was gently dilated to accommodate a 7 mm suction curette that was gently advanced to the uterine fundus.  The  suction device was then activated and curette slowly rotated to clear the uterus of products of conception.  A sharp curettage was then performed to confirm complete emptying of the uterus. There was minimal bleeding noted and the tenaculum removed with good hemostasis noted.   All instruments were removed from the patient's vagina.  Sponge and instrument counts were correct times two. The patient tolerated the procedure well and was taken to the recovery area awake, and in stable condition.  The patient will be discharged to home as per PACU criteria.  Routine postoperative instructions given.  She was prescribed Percocet, Ibuprofen and Colace.  She will follow up in the clinic on 05/29/16 for postoperative evaluation.   Jaynie CollinsUGONNA  [name], MD, FACOG Attending Obstetrician & Gynecologist Faculty Practice, [facility] [place]

## 2016-05-01 NOTE — Anesthesia Preprocedure Evaluation (Addendum)
Anesthesia Evaluation  Patient identified by MRN, date of birth, ID band Patient awake    Reviewed: Allergy & Precautions, NPO status , Patient's Chart, lab work & pertinent test results  Airway Mallampati: II  TM Distance: >3 FB Neck ROM: Full    Dental  (+) Teeth Intact, Dental Advisory Given   Pulmonary asthma , Current Smoker,    Pulmonary exam normal breath sounds clear to auscultation       Cardiovascular Exercise Tolerance: Good negative cardio ROS Normal cardiovascular exam Rhythm:Regular Rate:Normal     Neuro/Psych PSYCHIATRIC DISORDERS Depression negative neurological ROS     GI/Hepatic negative GI ROS, Neg liver ROS,   Endo/Other  negative endocrine ROS  Renal/GU negative Renal ROS     Musculoskeletal negative musculoskeletal ROS (+)   Abdominal   Peds  Hematology negative hematology ROS (+)   Anesthesia Other Findings Day of surgery medications reviewed with the patient.  Reproductive/Obstetrics Missed abortion--diagnosed [id number] missed abortion on 04/12/16                             Anesthesia Physical Anesthesia Plan  ASA: II  Anesthesia Plan: MAC   Post-op Pain Management:    Induction: Intravenous  Airway Management Planned: Nasal Cannula  Additional Equipment:   Intra-op Plan:   Post-operative Plan:   Informed Consent: I have reviewed the patients History and Physical, chart, labs and discussed the procedure including the risks, benefits and alternatives for the proposed anesthesia with the patient or authorized representative who has indicated his/her understanding and acceptance.   Dental advisory given  Plan Discussed with: CRNA and Anesthesiologist  Anesthesia Plan Comments: (Discussed risks/benefits/alternatives to MAC sedation including need for ventilatory support, hypotension, need for conversion to general anesthesia.  All patient questions answered.   Patient/guardian wishes to proceed.)        Anesthesia Quick Evaluation

## 2016-05-01 NOTE — Transfer of Care (Signed)
Immediate Anesthesia Transfer of Care Note  Patient: Sue Graves  Procedure(s) Performed: Procedure(s): DILATATION AND EVACUATION for Retained Product (N/A)  Patient Location: PACU  Anesthesia Type:MAC  Level of Consciousness: awake, alert  and oriented  Airway & Oxygen Therapy: Patient Spontanous Breathing  Post-op Assessment: Report given to RN and Post -op Vital signs reviewed and stable  Post vital signs: Reviewed and stable  Last Vitals:  Vitals:   05/01/16 1026  BP: 121/84  Pulse: (!) 104  Resp: 16  Temp: 37.6 C    Last Pain:  Vitals:   05/01/16 1026  TempSrc: Oral  PainSc: 7          Complications: No apparent anesthesia complications

## 2016-05-01 NOTE — Progress Notes (Signed)
I received a referral from pt's nurse as pt was tearful prior to surgery.  I was with pt as she awakened after surgery and she was very distressed that she could not remember anything and could not even recall getting the medicine.  She was feeling particularly vulnerable at waking up with strangers as she was still with her cousin when she became groggy and does not remember leaving Short Stay.  She was upset that her mother was not able to be there with her and that FOB had not come.  She shared her anger about her 2 pregnancy losses and her fear of trying again and experiencing another loss.  She expressed her feelings of guilt that she must have done something wrong.  I normalized her feelings and offered reflective listening as she processed.  She was given my card for additional follow up support.  Chaplain Dyanne CarrelKaty Ileah Falkenstein, Bcc Pager, [phone] 4:42 PM    05/01/16 1600  Clinical Encounter Type  Visited With Patient  Visit Type Initial;Spiritual support  Referral From Nurse  Spiritual Encounters  Spiritual Needs Grief support

## 2016-05-01 NOTE — Discharge Instructions (Addendum)
Dilation and Curettage or Vacuum Curettage, Care After Refer to this sheet in the next few weeks. These instructions provide you with information on caring for yourself after your procedure. Your health care provider may also give you more specific instructions. Your treatment has been planned according to current medical practices, but problems sometimes occur. Call your health care provider if you have any problems or questions after your procedure. WHAT TO EXPECT AFTER THE PROCEDURE After your procedure, it is typical to have light cramping and bleeding. This may last for 2 days to 2 weeks after the procedure. HOME CARE INSTRUCTIONS   Do not drive for 24 hours.  Wait 1 week before returning to strenuous activities.  Take your temperature 2 times a day for 4 days and write it down. Provide these temperatures to your health care provider if you develop a fever.  Avoid long periods of standing.  Avoid heavy lifting, pushing, or pulling. Do not lift anything heavier than 10 pounds (4.5 kg).  Limit stair climbing to once or twice a day.  Take rest periods often.  You may resume your usual diet.  Drink enough fluids to keep your urine clear or pale yellow.  Your usual bowel function should return. If you have constipation, you may:  Take a mild laxative with permission from your health care provider.  Add fruit and bran to your diet.  Drink more fluids.  Take showers instead of baths until your health care provider gives you permission to take baths.  Do not go swimming or use a hot tub until your health care provider approves.  Try to have someone with you or available to you the first 24-48 hours, especially if you were given a general anesthetic.  Do not douche, use tampons, or have sex (intercourse) for 2 weeks after the procedure.  Only take over-the-counter or prescription medicines as directed by your health care provider. Do not take aspirin. It can cause  bleeding.  Follow up with your health care provider as directed. SEEK MEDICAL CARE IF:   You have increasing cramps or pain that is not relieved with medicine.  You have abdominal pain that does not seem to be related to the same area of earlier cramping and pain.  You have bad smelling vaginal discharge.  You have a rash.  You are having problems with any medicine. SEEK IMMEDIATE MEDICAL CARE IF:   You have bleeding that is heavier than a normal menstrual period.  You have a fever.  You have chest pain.  You have shortness of breath.  You feel dizzy or feel like fainting.  You pass out.  You have pain in your shoulder strap area.  You have heavy vaginal bleeding with or without blood clots. MAKE SURE YOU:   Understand these instructions.  Will watch your condition.  Will get help right away if you are not doing well or get worse.   This information is not intended to replace advice given to you by your health care provider. Make sure you discuss any questions you have with your health care provider.   Document Released: 08/02/2000 Document Revised: 08/10/2013 Document Reviewed: 03/04/2013 Elsevier Interactive Patient Education 2016 [employer] Inc[employer]   Post Anesthesia Home Care Instructions  No ibuprofen products until: 4:50 pm today  Activity: Get plenty of rest for the remainder of the day. A responsible adult should stay with you for 24 hours following the procedure.  For the next 24 hours, DO NOT: -Drive a car -  Operate machinery -Drink alcoholic beverages -Take any medication unless instructed by your physician -Make any legal decisions or sign important papers.  Meals: Start with liquid foods such as gelatin or soup. Progress to regular foods as tolerated. Avoid greasy, spicy, heavy foods. If nausea and/or vomiting occur, drink only clear liquids until the nausea and/or vomiting subsides. Call your physician if vomiting continues.  Special  Instructions/Symptoms: Your throat may feel dry or sore from the anesthesia or the breathing tube placed in your throat during surgery. If this causes discomfort, gargle with warm salt water. The discomfort should disappear within 24 hours.  If you had a scopolamine patch placed behind your ear for the management of post- operative nausea and/or vomiting:  1. The medication in the patch is effective for 72 hours, after which it should be removed.  Wrap patch in a tissue and discard in the trash. Wash hands thoroughly with soap and water. 2. You may remove the patch earlier than 72 hours if you experience unpleasant side effects which may include dry mouth, dizziness or visual disturbances. 3. Avoid touching the patch. Wash your hands with soap and water after contact with the patch.

## 2016-05-01 NOTE — Anesthesia Postprocedure Evaluation (Signed)
Anesthesia Post Note  Patient: Sue Graves  Procedure(s) Performed: Procedure(s) (LRB): DILATATION AND EVACUATION for Retained Product (N/A)  Patient location during evaluation: PACU Anesthesia Type: MAC Level of consciousness: awake Pain management: pain level controlled Vital Signs Assessment: post-procedure vital signs reviewed and stable Respiratory status: spontaneous breathing Cardiovascular status: stable Postop Assessment: no signs of nausea or vomiting Anesthetic complications: no     Last Vitals:  Vitals:   05/01/16 1315 05/01/16 1415  BP:  122/79  Pulse: 96 81  Resp: 14 18  Temp:  37.2 C    Last Pain:  Vitals:   05/01/16 1415  TempSrc:   PainSc: 0-No pain   Pain Goal: Patients Stated Pain Goal: 3 (05/01/16 1315)               Shade Rivenbark JR,JOHN Susann GivensFRANKLIN

## 2016-05-02 ENCOUNTER — Encounter (HOSPITAL_COMMUNITY): Payer: Self-pay | Admitting: Obstetrics & Gynecology

## 2016-05-06 ENCOUNTER — Encounter: Payer: Self-pay | Admitting: Family Medicine

## 2016-05-08 ENCOUNTER — Ambulatory Visit: Payer: Medicaid Other | Admitting: Certified Nurse Midwife

## 2016-05-29 ENCOUNTER — Ambulatory Visit: Payer: Medicaid Other | Admitting: Obstetrics & Gynecology

## 2016-06-17 ENCOUNTER — Inpatient Hospital Stay (HOSPITAL_COMMUNITY)
Admission: AD | Admit: 2016-06-17 | Discharge: 2016-06-17 | Disposition: A | Discharge status: Home / Self Care | Payer: Medicaid Other | Source: Ambulatory Visit | Attending: Family Medicine | Admitting: Family Medicine

## 2016-06-17 ENCOUNTER — Encounter (HOSPITAL_COMMUNITY): Payer: Self-pay | Admitting: *Deleted

## 2016-06-17 DIAGNOSIS — J069 Acute upper respiratory infection, unspecified: Secondary | ICD-10-CM | POA: Insufficient documentation

## 2016-06-17 DIAGNOSIS — R05 Cough: Secondary | ICD-10-CM | POA: Diagnosis present

## 2016-06-17 DIAGNOSIS — F17210 Nicotine dependence, cigarettes, uncomplicated: Secondary | ICD-10-CM | POA: Diagnosis not present

## 2016-06-17 DIAGNOSIS — Z3202 Encounter for pregnancy test, result negative: Secondary | ICD-10-CM | POA: Diagnosis not present

## 2016-06-17 LAB — URINALYSIS, ROUTINE W REFLEX MICROSCOPIC
Bilirubin Urine: NEGATIVE
GLUCOSE, UA: NEGATIVE mg/dL
Ketones, ur: NEGATIVE mg/dL
LEUKOCYTES UA: NEGATIVE
Nitrite: NEGATIVE
PH: 7 (ref 5.0–8.0)
PROTEIN: NEGATIVE mg/dL
SPECIFIC GRAVITY, URINE: 1.02 (ref 1.005–1.030)

## 2016-06-17 LAB — URINE MICROSCOPIC-ADD ON
BACTERIA UA: NONE SEEN
WBC UA: NONE SEEN WBC/hpf (ref 0–5)

## 2016-06-17 LAB — POCT PREGNANCY, URINE: Preg Test, Ur: NEGATIVE

## 2016-06-17 MED ORDER — IBUPROFEN 600 MG PO TABS: 600.0000 mg | ORAL_TABLET | Freq: Four times a day (QID) | ORAL | 0 refills | Status: DC | PRN

## 2016-06-17 MED ORDER — BENZONATATE 100 MG PO CAPS: 100.0000 mg | ORAL_CAPSULE | Freq: Three times a day (TID) | ORAL | 0 refills | Status: DC

## 2016-06-17 NOTE — MAU Provider Note (Signed)
History     CSN: [id number]  Arrival date and time: 06/17/16 1648   First Provider Initiated Contact with Patient 06/17/16 1848      Chief Complaint  Patient presents with  . URI   HPI   [name] is a 20 y.o. female G2P0010 non pregnant female here with URI symptoms X 5 days. Itchy throat, cough and head congestion. Everyone in her house has had the same "cold". She missed work today and would like a note for tomorrow. Denies fever.   OB History    Gravida Para Term Preterm AB Living   2       1     SAB TAB Ectopic Multiple Live Births   1              Past Medical History:  Diagnosis Date  . Asthma    as a child , no inhaler  . Depression    history - no meds  . Missed abortion    no surgery required    Past Surgical History:  Procedure Laterality Date  . DILATION AND EVACUATION N/A 05/01/2016   Procedure: DILATATION AND EVACUATION for Retained Product;  Surgeon: Tereso NewcomerUgonna A Anyanwu, MD;  Location: [facility];  Service: Gynecology;  Laterality: N/A;  . WISDOM TOOTH EXTRACTION      History reviewed. No pertinent family history.  Social History  Substance Use Topics  . Smoking status: Current Some Day Smoker    Packs/day: 0.25    Years: 3.00    Types: Cigarettes  . Smokeless tobacco: Never Used  . Alcohol use No     Comment: occasionally     Allergies:  Allergies  Allergen Reactions  . Other Itching    Scented soaps/body wash  . Tomato Hives and Swelling    Prescriptions Prior to Admission  Medication Sig Dispense Refill Last Dose  . docusate sodium (COLACE) 100 MG capsule Take 1 capsule (100 mg total) by mouth 2 (two) times daily as needed. 30 capsule 2   . ibuprofen (ADVIL,MOTRIN) 800 MG tablet Take 1 tablet (800 mg total) by mouth every 8 (eight) hours as needed. 30 tablet 2   . oxyCODONE-acetaminophen (PERCOCET/ROXICET) 5-325 MG tablet Take 1-2 tablets by mouth every 6 (six) hours as needed for severe pain. 30 tablet 0   . promethazine  (PHENERGAN) 12.5 MG tablet Take 1 tablet (12.5 mg total) by mouth every 6 (six) hours as needed for nausea or vomiting. (Patient not taking: Reported on 05/01/2016) 30 tablet 0 Not Taking at Unknown time   Results for orders placed or performed during the hospital encounter of 06/17/16 (from the past 48 hour(s))  Urinalysis, Routine w reflex microscopic ([facility])     Status: Abnormal   Collection Time: 06/17/16  5:18 PM  Result Value Ref Range   Color, Urine AMBER (A) YELLOW    Comment: BIOCHEMICALS MAY BE AFFECTED BY COLOR   APPearance HAZY (A) CLEAR   Specific Gravity, Urine 1.020 1.005 - 1.030   pH 7.0 5.0 - 8.0   Glucose, UA NEGATIVE NEGATIVE mg/dL   Hgb urine dipstick LARGE (A) NEGATIVE   Bilirubin Urine NEGATIVE NEGATIVE   Ketones, ur NEGATIVE NEGATIVE mg/dL   Protein, ur NEGATIVE NEGATIVE mg/dL   Nitrite NEGATIVE NEGATIVE   Leukocytes, UA NEGATIVE NEGATIVE  Urine microscopic-add on     Status: Abnormal   Collection Time: 06/17/16  5:18 PM  Result Value Ref Range   Squamous Epithelial / LPF 0-5 (A)  NONE SEEN   WBC, UA NONE SEEN 0 - 5 WBC/hpf   RBC / HPF TOO NUMEROUS TO COUNT 0 - 5 RBC/hpf   Bacteria, UA NONE SEEN NONE SEEN  Pregnancy, urine POC     Status: None   Collection Time: 06/17/16  5:57 PM  Result Value Ref Range   Preg Test, Ur NEGATIVE NEGATIVE    Comment:        THE SENSITIVITY OF THIS METHODOLOGY IS >24 mIU/mL     Review of Systems  Constitutional: Negative for chills and fever.  HENT: Positive for congestion and sore throat.   Neurological: Negative for headaches.   Physical Exam   Blood pressure 117/73, pulse 83, temperature 98.3 F (36.8 C), temperature source Oral, resp. rate 18, height 5' (1.524 m), weight 97 lb 9.6 oz (44.3 kg), last menstrual period 06/16/2016, SpO2 100 %.  Physical Exam  Constitutional: She is oriented to person, place, and time. She appears well-developed and well-nourished. No distress.  HENT:  Head: Normocephalic.   Nose: Right sinus exhibits maxillary sinus tenderness and frontal sinus tenderness. Left sinus exhibits maxillary sinus tenderness and frontal sinus tenderness.  Mouth/Throat: Oropharynx is clear and moist and mucous membranes are normal. No oropharyngeal exudate, posterior oropharyngeal edema, posterior oropharyngeal erythema or tonsillar abscesses.  Eyes: Pupils are equal, round, and reactive to light.  Neck: Neck supple.  Cardiovascular: Normal rate.   Respiratory: Breath sounds normal. No accessory muscle usage. No respiratory distress. She has no wheezes. She has no rales. She exhibits no tenderness.  Musculoskeletal: Normal range of motion.  Neurological: She is alert and oriented to person, place, and time.  Skin: Skin is warm. She is not diaphoretic.  Psychiatric: Her behavior is normal.    MAU Course  Procedures  None  MDM  Urine pregnancy test negative   Assessment and Plan    A:  1. Upper respiratory virus     P:  Discharge home in stable condition Rx: ibuprofen, tesslon  If symptoms worsen, go to PCP or Urgent care Cool Mist humidifier    Duane LopeJennifer I Sya Nestler, NP  06/17/2016 7:19 PM

## 2016-06-17 NOTE — Discharge Instructions (Signed)
Cool Mist Vaporizers °Vaporizers may help relieve the symptoms of a cough and cold. They add moisture to the air, which helps mucus to become thinner and less sticky. This makes it easier to breathe and cough up secretions. Cool mist vaporizers do not cause serious burns like hot mist vaporizers, which may also be called steamers or humidifiers. Vaporizers have not been proven to help with colds. You should not use a vaporizer if you are allergic to mold. °HOME CARE INSTRUCTIONS °· Follow the package instructions for the vaporizer. °· Do not use anything other than distilled water in the vaporizer. °· Do not run the vaporizer all of the time. This can cause mold or bacteria to grow in the vaporizer. °· Clean the vaporizer after each time it is used. °· Clean and dry the vaporizer well before storing it. °· Stop using the vaporizer if worsening respiratory symptoms develop. °  °This information is not intended to replace advice given to you by your health care provider. Make sure you discuss any questions you have with your health care provider. °  °Document Released: 05/02/2004 Document Revised: 08/10/2013 Document Reviewed: 12/23/2012 °Elsevier Interactive Patient Education ©2016 [employer] Inc. ° °Cough, Adult °A cough helps to clear your throat and lungs. A cough may last only 2-3 weeks (acute), or it may last longer than 8 weeks (chronic). Many different things can cause a cough. A cough may be a sign of an illness or another medical condition. °HOME CARE °· Pay attention to any changes in your cough. °· Take medicines only as told by your doctor. °¨ If you were prescribed an antibiotic medicine, take it as told by your doctor. Do not stop taking it even if you start to feel better. °¨ Talk with your doctor before you try using a cough medicine. °· Drink enough fluid to keep your pee (urine) clear or pale yellow. °· If the air is dry, use a cold steam vaporizer or humidifier in your home. °· Stay away from things  that make you cough at work or at home. °· If your cough is worse at night, try using extra pillows to raise your head up higher while you sleep. °· Do not smoke, and try not to be around smoke. If you need help quitting, ask your doctor. °· Do not have caffeine. °· Do not drink alcohol. °· Rest as needed. °GET HELP IF: °· You have new problems (symptoms). °· You cough up yellow fluid (pus). °· Your cough does not get better after 2-3 weeks, or your cough gets worse. °· Medicine does not help your cough and you are not sleeping well. °· You have pain that gets worse or pain that is not helped with medicine. °· You have a fever. °· You are losing weight and you do not know why. °· You have night sweats. °GET HELP RIGHT AWAY IF: °· You cough up blood. °· You have trouble breathing. °· Your heartbeat is very fast. °  °This information is not intended to replace advice given to you by your health care provider. Make sure you discuss any questions you have with your health care provider. °  °Document Released: 04/18/2011 Document Revised: 04/26/2015 Document Reviewed: 10/12/2014 °Elsevier Interactive Patient Education ©2016 [employer] Inc. ° °

## 2016-06-17 NOTE — MAU Note (Signed)
Been sick for like 5 days.  (sinus). Yesterday when she woke up, her whole face hurt.  Increased nasal drainage, noted  Blood in her mucous.  Productive cough, runny eyes. Unknown fever.

## 2016-07-15 ENCOUNTER — Ambulatory Visit (INDEPENDENT_AMBULATORY_CARE_PROVIDER_SITE_OTHER): Payer: Medicaid Other

## 2016-07-15 DIAGNOSIS — Z3201 Encounter for pregnancy test, result positive: Secondary | ICD-10-CM | POA: Diagnosis not present

## 2016-07-15 LAB — POCT PREGNANCY, URINE
Preg Test, Ur: POSITIVE — AB
Preg Test, Ur: POSITIVE — AB

## 2016-07-15 NOTE — Progress Notes (Signed)
Patient presented to office today for pregnancy test. Test confirms she is pregnant at this time. Medication has been reviewed with patient. Patient plans to follow up with her ob of choice. A letter of verification has been given to patient

## 2016-07-22 ENCOUNTER — Encounter (HOSPITAL_COMMUNITY): Payer: Self-pay

## 2016-08-14 ENCOUNTER — Encounter (HOSPITAL_COMMUNITY): Payer: Self-pay | Admitting: *Deleted

## 2016-08-14 ENCOUNTER — Inpatient Hospital Stay (HOSPITAL_COMMUNITY)
Admission: AD | Admit: 2016-08-14 | Discharge: 2016-08-14 | Disposition: A | Discharge status: Home / Self Care | Payer: Medicaid Other | Source: Ambulatory Visit | Attending: Family Medicine | Admitting: Family Medicine

## 2016-08-14 DIAGNOSIS — Z79899 Other long term (current) drug therapy: Secondary | ICD-10-CM | POA: Diagnosis not present

## 2016-08-14 DIAGNOSIS — F17210 Nicotine dependence, cigarettes, uncomplicated: Secondary | ICD-10-CM | POA: Insufficient documentation

## 2016-08-14 DIAGNOSIS — F329 Major depressive disorder, single episode, unspecified: Secondary | ICD-10-CM | POA: Diagnosis not present

## 2016-08-14 DIAGNOSIS — O219 Vomiting of pregnancy, unspecified: Secondary | ICD-10-CM

## 2016-08-14 DIAGNOSIS — O210 Mild hyperemesis gravidarum: Secondary | ICD-10-CM | POA: Diagnosis present

## 2016-08-14 DIAGNOSIS — O99511 Diseases of the respiratory system complicating pregnancy, first trimester: Secondary | ICD-10-CM | POA: Diagnosis not present

## 2016-08-14 DIAGNOSIS — O99341 Other mental disorders complicating pregnancy, first trimester: Secondary | ICD-10-CM | POA: Insufficient documentation

## 2016-08-14 DIAGNOSIS — Z3A10 10 weeks gestation of pregnancy: Secondary | ICD-10-CM | POA: Diagnosis not present

## 2016-08-14 DIAGNOSIS — O99331 Smoking (tobacco) complicating pregnancy, first trimester: Secondary | ICD-10-CM | POA: Insufficient documentation

## 2016-08-14 DIAGNOSIS — J45909 Unspecified asthma, uncomplicated: Secondary | ICD-10-CM | POA: Insufficient documentation

## 2016-08-14 LAB — URINALYSIS, ROUTINE W REFLEX MICROSCOPIC
BILIRUBIN URINE: NEGATIVE
GLUCOSE, UA: NEGATIVE mg/dL
HGB URINE DIPSTICK: NEGATIVE
Ketones, ur: NEGATIVE mg/dL
NITRITE: NEGATIVE
PH: 6 (ref 5.0–8.0)
Protein, ur: NEGATIVE mg/dL
SPECIFIC GRAVITY, URINE: 1.024 (ref 1.005–1.030)

## 2016-08-14 MED ORDER — PROMETHAZINE HCL 12.5 MG PO TABS: 12.5000 mg | ORAL_TABLET | Freq: Four times a day (QID) | ORAL | 0 refills | Status: DC | PRN

## 2016-08-14 NOTE — MAU Note (Signed)
Wanted to get a check up.  Feeling so bad.  Is so nauseous.  No appetite. Not on any medication for the nausea. Threw up early this morning, just bile.

## 2016-08-14 NOTE — Discharge Instructions (Signed)
Morning Sickness °Morning sickness is when you feel sick to your stomach (nauseous) during pregnancy. This nauseous feeling may or may not come with vomiting. It often occurs in the morning but can be a problem any time of day. Morning sickness is most common during the first trimester, but it may continue throughout pregnancy. While morning sickness is unpleasant, it is usually harmless unless you develop severe and continual vomiting (hyperemesis gravidarum). This condition requires more intense treatment. °What are the causes? °The cause of morning sickness is not completely known but seems to be related to normal hormonal changes that occur in pregnancy. °What increases the risk? °You are at greater risk if you: °· Experienced nausea or vomiting before your pregnancy. °· Had morning sickness during a previous pregnancy. °· Are pregnant with more than one baby, such as twins. ° °How is this treated? °Do not use any medicines (prescription, over-the-counter, or herbal) for morning sickness without first talking to your health care provider. Your health care provider may prescribe or recommend: °· Vitamin B6 supplements. °· Anti-nausea medicines. °· The herbal medicine ginger. ° °Follow these instructions at home: °· Only take over-the-counter or prescription medicines as directed by your health care provider. °· Taking multivitamins before getting pregnant can prevent or decrease the severity of morning sickness in most women. °· Eat a piece of dry toast or unsalted crackers before getting out of bed in the morning. °· Eat five or six small meals a day. °· Eat dry and bland foods (rice, baked potato). Foods high in carbohydrates are often helpful. °· Do not drink liquids with your meals. Drink liquids between meals. °· Avoid greasy, fatty, and spicy foods. °· Get someone to cook for you if the smell of any food causes nausea and vomiting. °· If you feel nauseous after taking prenatal vitamins, take the vitamins at  night or with a snack. °· Snack on protein foods (nuts, yogurt, cheese) between meals if you are hungry. °· Eat unsweetened gelatins for desserts. °· Wearing an acupressure wristband (worn for sea sickness) may be helpful. °· Acupuncture may be helpful. °· Do not smoke. °· Get a humidifier to keep the air in your house free of odors. °· Get plenty of fresh air. °Contact a health care provider if: °· Your home remedies are not working, and you need medicine. °· You feel dizzy or lightheaded. °· You are losing weight. °Get help right away if: °· You have persistent and uncontrolled nausea and vomiting. °· You pass out (faint). °This information is not intended to replace advice given to you by your health care provider. Make sure you discuss any questions you have with your health care provider. °Document Released: 09/26/2006 Document Revised: 01/11/2016 Document Reviewed: 01/20/2013 °Elsevier Interactive Patient Education © 2017 Elsevier Inc. ° °

## 2016-08-14 NOTE — MAU Provider Note (Signed)
History     CSN: [id number]  Arrival date and time: 08/14/16 1650   First Provider Initiated Contact with Patient 08/14/16 1800      Chief Complaint  Patient presents with  . Morning Sickness   HPI Sue Graves is a 20 y.o. G3P0020 at 3498w6d by LMP who presents with nausea/vomiting. Symptoms began 3 days ago. Reports constant nausea. Vomited once this morning; consisted of bile. Able to eat a drink since then. Denies heartburn, abdominal pain, or vaginal bleeding. Has not started prenatal care.   OB History    Gravida Para Term Preterm AB Living   3       2     SAB TAB Ectopic Multiple Live Births   2              Past Medical History:  Diagnosis Date  . Asthma    as a child , no inhaler  . Depression    history - no meds  . Missed abortion    no surgery required    Past Surgical History:  Procedure Laterality Date  . DILATION AND EVACUATION N/A 05/01/2016   Procedure: DILATATION AND EVACUATION for Retained Product;  Surgeon: Tereso NewcomerUgonna A Anyanwu, MD;  Location: [facility];  Service: Gynecology;  Laterality: N/A;  . WISDOM TOOTH EXTRACTION      History reviewed. No pertinent family history.  Social History  Substance Use Topics  . Smoking status: Current Some Day Smoker    Packs/day: 0.25    Years: 3.00    Types: Cigarettes  . Smokeless tobacco: Never Used  . Alcohol use No     Comment: occasionally     Allergies:  Allergies  Allergen Reactions  . Other Itching    Scented soaps/body wash  . Tomato Hives and Swelling    Prescriptions Prior to Admission  Medication Sig Dispense Refill Last Dose  . benzonatate (TESSALON) 100 MG capsule Take 1 capsule (100 mg total) by mouth every 8 (eight) hours. (Patient not taking: Reported on 08/14/2016) 21 capsule 0 Not Taking at Unknown time  . docusate sodium (COLACE) 100 MG capsule Take 1 capsule (100 mg total) by mouth 2 (two) times daily as needed. (Patient not taking: Reported on 08/14/2016) 30 capsule 2 Not Taking at  Unknown time  . ibuprofen (ADVIL,MOTRIN) 600 MG tablet Take 1 tablet (600 mg total) by mouth every 6 (six) hours as needed. (Patient not taking: Reported on 08/14/2016) 30 tablet 0 Not Taking at Unknown time  . oxyCODONE-acetaminophen (PERCOCET/ROXICET) 5-325 MG tablet Take 1-2 tablets by mouth every 6 (six) hours as needed for severe pain. (Patient not taking: Reported on 08/14/2016) 30 tablet 0 Not Taking at Unknown time  . promethazine (PHENERGAN) 12.5 MG tablet Take 1 tablet (12.5 mg total) by mouth every 6 (six) hours as needed for nausea or vomiting. (Patient not taking: Reported on 08/14/2016) 30 tablet 0 Not Taking at Unknown time    Review of Systems  Constitutional: Negative.   Gastrointestinal: Positive for nausea and vomiting. Negative for abdominal pain, constipation, diarrhea and heartburn.  Genitourinary: Negative.    Physical Exam   Blood pressure 118/70, pulse 77, temperature 98.5 F (36.9 C), temperature source Oral, resp. rate 18, weight 99 lb 9.6 oz (45.2 kg), last menstrual period 06/16/2016.  Physical Exam  Nursing note and vitals reviewed. Constitutional: She is oriented to person, place, and time. She appears well-developed and well-nourished. No distress.  HENT:  Head: Normocephalic and atraumatic.  Eyes: Conjunctivae are  normal. Right eye exhibits no discharge. Left eye exhibits no discharge. No scleral icterus.  Neck: Normal range of motion.  Cardiovascular: Normal rate, regular rhythm and normal heart sounds.   No murmur heard. Respiratory: Effort normal. No respiratory distress. She has no wheezes.  GI: Soft. Bowel sounds are normal. She exhibits no distension. There is no tenderness. There is no rebound and no guarding.  Neurological: She is alert and oriented to person, place, and time.  Skin: Skin is warm and dry. She is not diaphoretic.  Psychiatric: She has a normal mood and affect. Her behavior is normal. Judgment and thought content normal.    MAU  Course  Procedures Results for orders placed or performed during the hospital encounter of 08/14/16 (from the past 24 hour(s))  Urinalysis, Routine w reflex microscopic     Status: Abnormal   Collection Time: 08/14/16  5:25 PM  Result Value Ref Range   Color, Urine YELLOW YELLOW   APPearance CLEAR CLEAR   Specific Gravity, Urine 1.024 1.005 - 1.030   pH 6.0 5.0 - 8.0   Glucose, UA NEGATIVE NEGATIVE mg/dL   Hgb urine dipstick NEGATIVE NEGATIVE   Bilirubin Urine NEGATIVE NEGATIVE   Ketones, ur NEGATIVE NEGATIVE mg/dL   Protein, ur NEGATIVE NEGATIVE mg/dL   Nitrite NEGATIVE NEGATIVE   Leukocytes, UA TRACE (A) NEGATIVE   RBC / HPF 0-5 0 - 5 RBC/hpf   WBC, UA 0-5 0 - 5 WBC/hpf   Bacteria, UA RARE (A) NONE SEEN   Squamous Epithelial / LPF 0-5 (A) NONE SEEN   Mucous PRESENT     MDM No evidence of dehydration per u/a & VS Pt not observed vomiting while in MAU Will order outpatient ultrasound for viability  Assessment and Plan  A: 1. Nausea and vomiting during pregnancy prior to [redacted] weeks gestation    P: Discharge home Rx phenergan Op ultrasound [facility] Discussed reasons to return to [facility]U  Sue HornErin [name] 08/14/2016, 6:00 PM

## 2016-08-15 IMAGING — US US OB COMP LESS 14 WK
1 series · 15 of 28 positions shown · non-contrast
Comparison: Pelvic ultrasound 01/31/2016

CLINICAL DATA: Patient with abdominal cramping. Positive pregnancy
test.

EXAM:
OBSTETRIC <14 WK US AND TRANSVAGINAL OB US
TECHNIQUE: Both transabdominal and transvaginal ultrasound examinations were
performed for complete evaluation of the gestation as well as the
maternal uterus, adnexal regions, and pelvic cul-de-sac.
Transvaginal technique was performed to assess early pregnancy.

[Series 1: us ob comp less 14 wk · 15 of 64 slices shown]
[im 1/64]
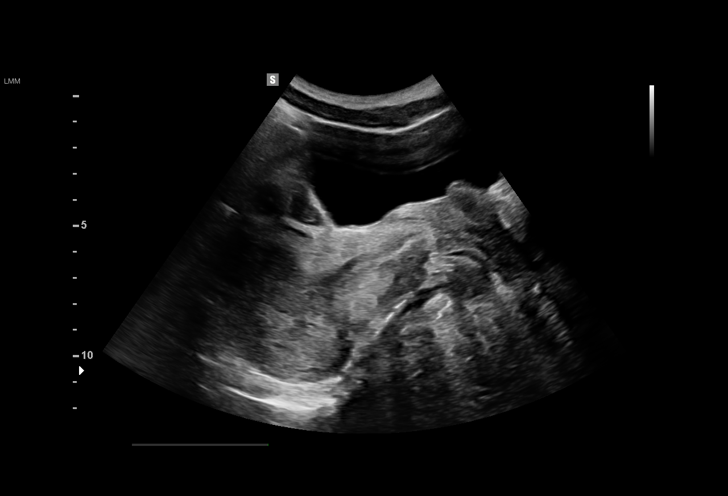
[im 5/64]
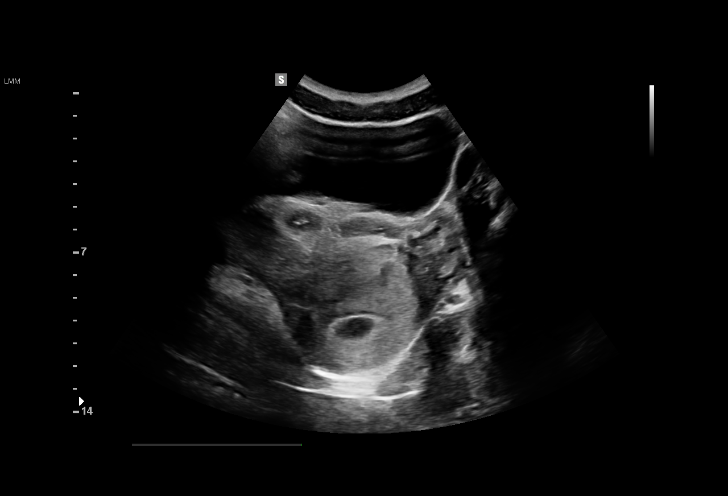
[im 10/64]
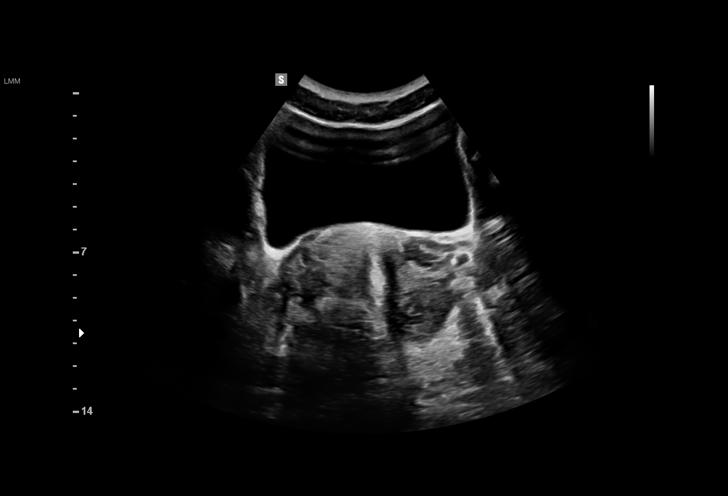
[im 15/64]
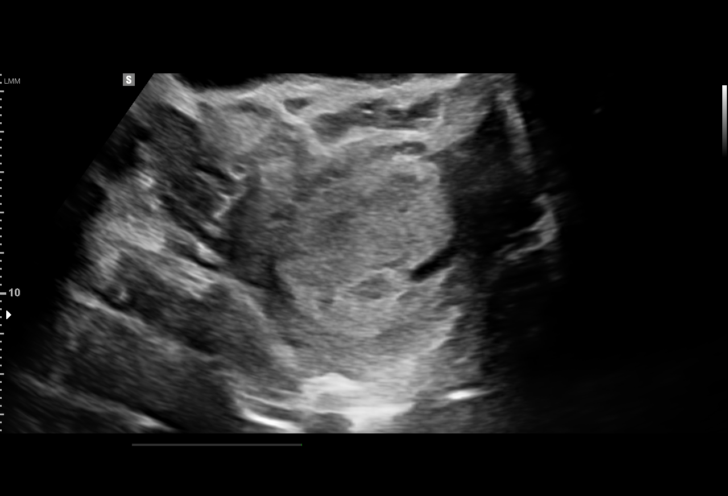
[im 19/64]
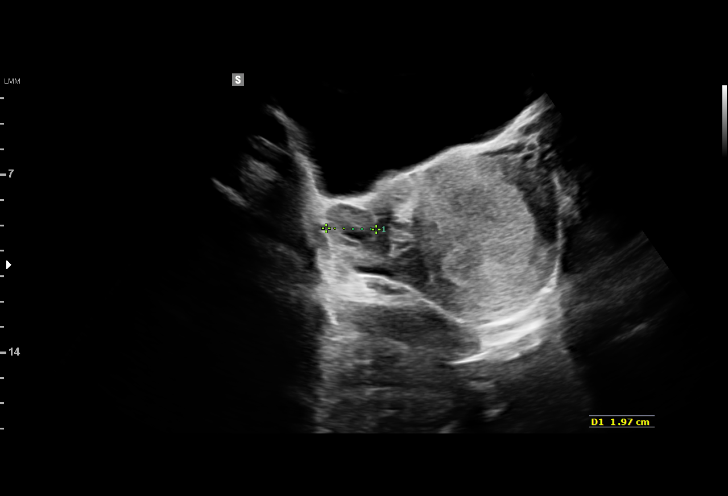
[im 24/64]
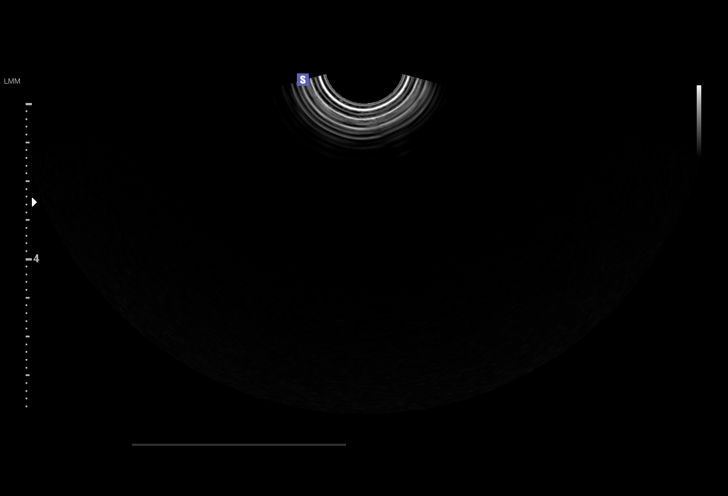
[im 29/64]
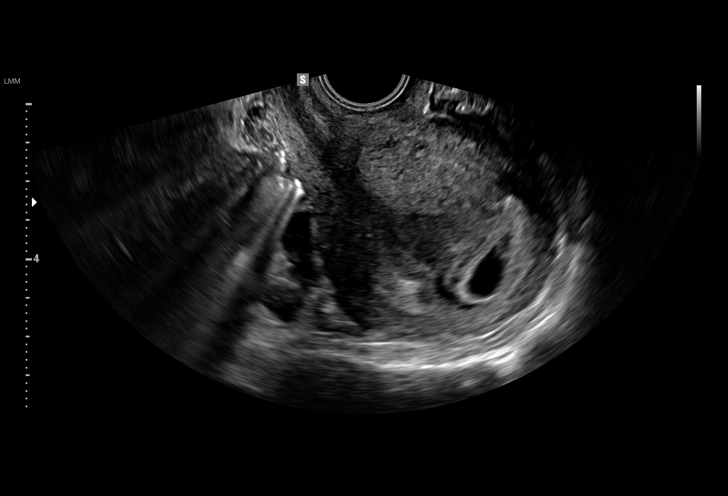
[im 33/64]
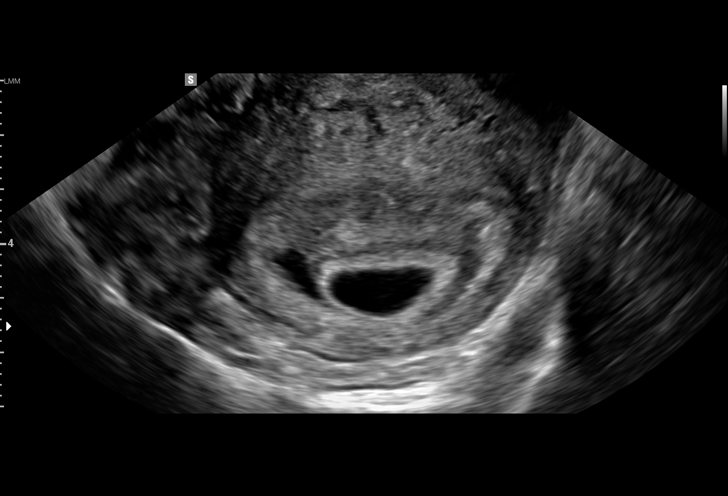
[im 36/64]
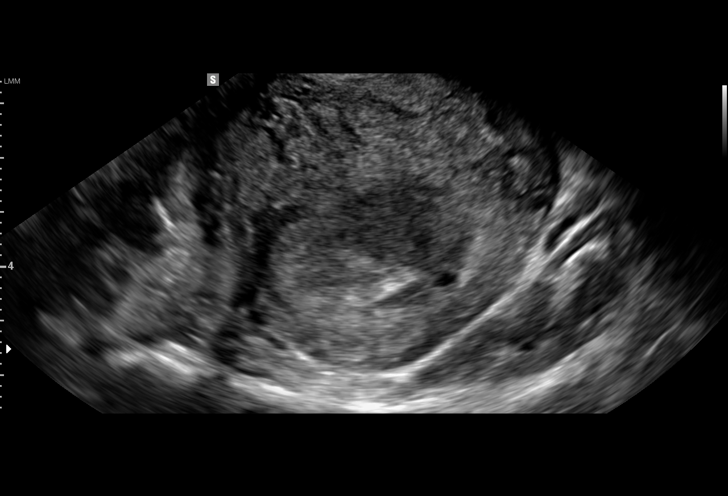
[im 40/64]
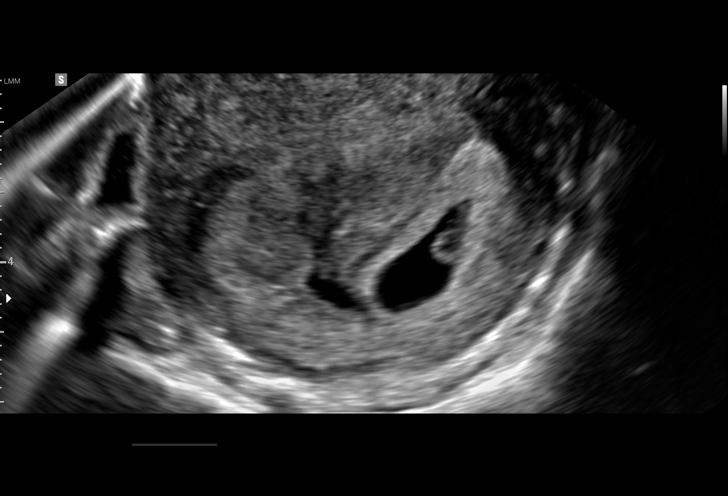
[im 45/64]
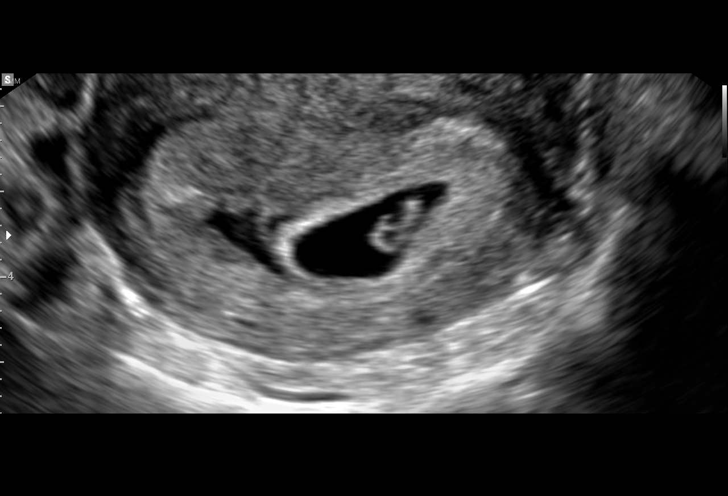
[im 50/64]
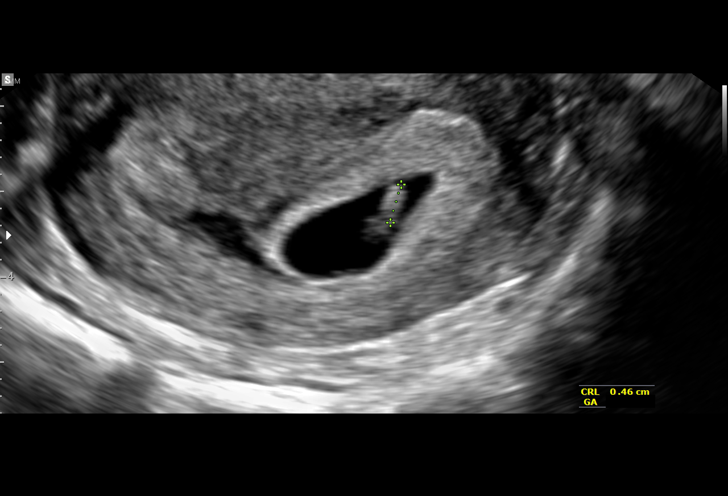
[im 54/64]
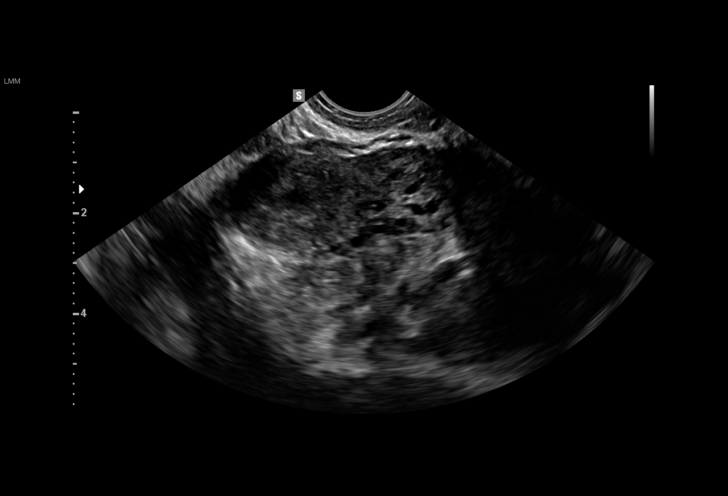
[im 59/64]
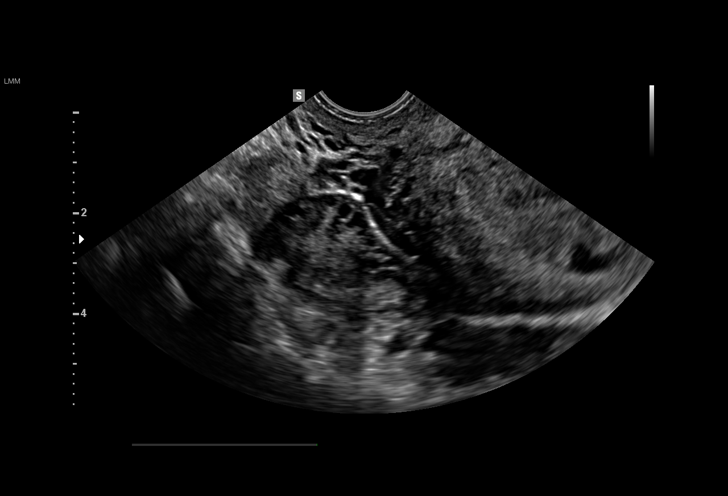
[im 64/64]
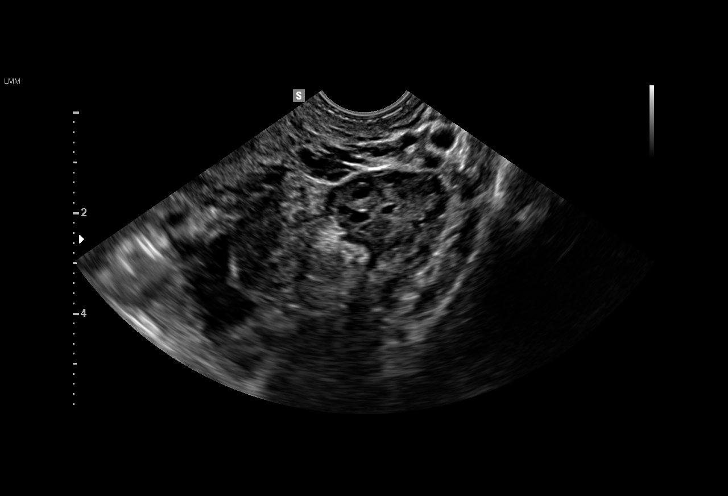

[15 of 28 positions shown; findings below may reference images not displayed]

FINDINGS: Intrauterine gestational sac: Present

Yolk sac:  Present

Embryo:  Present

Cardiac Activity: Present

Heart Rate: 126  bpm

CRL:  4.2  mm   6 w   1 d                  US EDC: 11/15/2016

Subchorionic hemorrhage:  Moderate subchorionic hemorrhage.

Maternal uterus/adnexae: Normal right and left ovaries. No free
fluid the pelvis.
IMPRESSION: Signal live intrauterine gestation. Moderate subchorionic
hemorrhage.

## 2016-08-19 NOTE — L&D Delivery Note (Signed)
21 y.o. G3P0020 at 3231w6d admitted for IOL for GHTN delivered a viable female infant at 2241 in cephalic, LOA position. No nuchal cord. Right anterior shoulder delivered with ease. 60 sec delayed cord clamping. Cord clamped x2 and cut. Placenta delivered spontaneously intact, with 3VC. Fundus firm on exam with massage and pitocin. Good hemostasis noted.  Anesthesia: Epidural Laceration: First degree perineal Suture: 3.0 Monocryl Good hemostasis noted. EBL: 100 cc  Mom and baby recovering in LDR.    Apgars: APGAR (1 MIN): 8   APGAR (5 MINS): 9   Weight: Pending skin to skin  Sponge and instrument count were correct x2. Placenta sent to L&D. Breast and bottle feeding, OCP's for contraception.  [place] Shirley, DO FM Resident PGY-1 03/19/2017 11:03 PM   Patient is a [id number] at 6531w6d who was admitted for IOL due to gHTN, but otherwise uncomplicated prenatal course.  She progressed with augmentation via foley, Pit, AROM.  I was gloved and present for delivery in its entirety.  Second stage of labor progressed to SVD.  No decels during second stage noted.  Complications: none  Lacerations: 1st deg perineal  EBL: 100cc  Cam HaiSHAW, KIMBERLY, CNM 11:07 PM 03/19/2017

## 2016-08-30 ENCOUNTER — Encounter (HOSPITAL_COMMUNITY): Payer: Self-pay | Admitting: *Deleted

## 2016-08-30 ENCOUNTER — Inpatient Hospital Stay (HOSPITAL_COMMUNITY)
Admission: AD | Admit: 2016-08-30 | Discharge: 2016-08-30 | Disposition: A | Discharge status: Home / Self Care | Payer: Medicaid Other | Source: Ambulatory Visit | Attending: Obstetrics and Gynecology | Admitting: Obstetrics and Gynecology

## 2016-08-30 ENCOUNTER — Encounter (HOSPITAL_COMMUNITY): Payer: Self-pay

## 2016-08-30 DIAGNOSIS — Z3A00 Weeks of gestation of pregnancy not specified: Secondary | ICD-10-CM | POA: Insufficient documentation

## 2016-08-30 DIAGNOSIS — O26899 Other specified pregnancy related conditions, unspecified trimester: Secondary | ICD-10-CM

## 2016-08-30 DIAGNOSIS — R102 Pelvic and perineal pain: Secondary | ICD-10-CM | POA: Insufficient documentation

## 2016-08-30 DIAGNOSIS — O26892 Other specified pregnancy related conditions, second trimester: Secondary | ICD-10-CM | POA: Diagnosis not present

## 2016-08-30 DIAGNOSIS — Z3A14 14 weeks gestation of pregnancy: Secondary | ICD-10-CM

## 2016-08-30 DIAGNOSIS — R109 Unspecified abdominal pain: Secondary | ICD-10-CM

## 2016-08-30 LAB — URINALYSIS, ROUTINE W REFLEX MICROSCOPIC
Bilirubin Urine: NEGATIVE
Glucose, UA: NEGATIVE mg/dL
Hgb urine dipstick: NEGATIVE
Ketones, ur: NEGATIVE mg/dL
Nitrite: NEGATIVE
PROTEIN: NEGATIVE mg/dL
SPECIFIC GRAVITY, URINE: 1.018 (ref 1.005–1.030)
pH: 7 (ref 5.0–8.0)

## 2016-08-30 LAB — WET PREP, GENITAL
CLUE CELLS WET PREP: NONE SEEN
SPERM: NONE SEEN
Trich, Wet Prep: NONE SEEN
YEAST WET PREP: NONE SEEN

## 2016-08-30 NOTE — MAU Note (Signed)
Pain in abd, especially when she stretches

## 2016-09-02 ENCOUNTER — Ambulatory Visit: Payer: Medicaid Other | Admitting: *Deleted

## 2016-09-02 ENCOUNTER — Ambulatory Visit (HOSPITAL_COMMUNITY)
Admission: RE | Admit: 2016-09-02 | Discharge: 2016-09-02 | Disposition: A | Discharge status: Home / Self Care | Payer: Medicaid Other | Source: Ambulatory Visit | Attending: Advanced Practice Midwife | Admitting: Advanced Practice Midwife

## 2016-09-02 ENCOUNTER — Encounter: Payer: Self-pay | Admitting: *Deleted

## 2016-09-02 ENCOUNTER — Other Ambulatory Visit (HOSPITAL_COMMUNITY): Payer: Self-pay | Admitting: Student

## 2016-09-02 DIAGNOSIS — Z3689 Encounter for other specified antenatal screening: Secondary | ICD-10-CM | POA: Diagnosis not present

## 2016-09-02 DIAGNOSIS — O210 Mild hyperemesis gravidarum: Secondary | ICD-10-CM | POA: Diagnosis present

## 2016-09-02 DIAGNOSIS — Z712 Person consulting for explanation of examination or test findings: Secondary | ICD-10-CM

## 2016-09-02 DIAGNOSIS — Z3A10 10 weeks gestation of pregnancy: Secondary | ICD-10-CM | POA: Diagnosis not present

## 2016-09-02 DIAGNOSIS — O219 Vomiting of pregnancy, unspecified: Secondary | ICD-10-CM

## 2016-09-02 LAB — GC/CHLAMYDIA PROBE AMP (~~LOC~~) NOT AT ARMC
CHLAMYDIA, DNA PROBE: NEGATIVE
Neisseria Gonorrhea: NEGATIVE

## 2016-09-02 NOTE — Progress Notes (Signed)
Patient presents to clinic for u/s results. Ultrasound confirms pregnancy, gestational sac, yolk sac, embryo and cardiac activity present. EDC 03/27/17. Informed patient of results, understanding voiced. Patient expressed disappointment and frustration that the due dates keep changing and I could not tell her when she conceived.  Advised starting prenatal vitamins, patient [facility]. Pregnancy verification letter given.

## 2016-09-08 ENCOUNTER — Encounter (HOSPITAL_COMMUNITY): Payer: Self-pay | Admitting: *Deleted

## 2016-09-08 ENCOUNTER — Inpatient Hospital Stay (HOSPITAL_COMMUNITY)
Admission: AD | Admit: 2016-09-08 | Discharge: 2016-09-08 | Disposition: A | Discharge status: Home / Self Care | Payer: Medicaid Other | Source: Ambulatory Visit | Attending: Obstetrics & Gynecology | Admitting: Obstetrics & Gynecology

## 2016-09-08 DIAGNOSIS — O99511 Diseases of the respiratory system complicating pregnancy, first trimester: Secondary | ICD-10-CM | POA: Insufficient documentation

## 2016-09-08 DIAGNOSIS — J45909 Unspecified asthma, uncomplicated: Secondary | ICD-10-CM | POA: Diagnosis not present

## 2016-09-08 DIAGNOSIS — N760 Acute vaginitis: Secondary | ICD-10-CM | POA: Diagnosis not present

## 2016-09-08 DIAGNOSIS — Z3A11 11 weeks gestation of pregnancy: Secondary | ICD-10-CM | POA: Diagnosis not present

## 2016-09-08 DIAGNOSIS — O99331 Smoking (tobacco) complicating pregnancy, first trimester: Secondary | ICD-10-CM | POA: Diagnosis not present

## 2016-09-08 DIAGNOSIS — F329 Major depressive disorder, single episode, unspecified: Secondary | ICD-10-CM | POA: Insufficient documentation

## 2016-09-08 DIAGNOSIS — R109 Unspecified abdominal pain: Secondary | ICD-10-CM | POA: Diagnosis present

## 2016-09-08 DIAGNOSIS — O26891 Other specified pregnancy related conditions, first trimester: Secondary | ICD-10-CM | POA: Insufficient documentation

## 2016-09-08 DIAGNOSIS — B9689 Other specified bacterial agents as the cause of diseases classified elsewhere: Secondary | ICD-10-CM

## 2016-09-08 DIAGNOSIS — O99341 Other mental disorders complicating pregnancy, first trimester: Secondary | ICD-10-CM | POA: Diagnosis not present

## 2016-09-08 LAB — WET PREP, GENITAL
Sperm: NONE SEEN
Trich, Wet Prep: NONE SEEN
Yeast Wet Prep HPF POC: NONE SEEN

## 2016-09-08 LAB — URINALYSIS, ROUTINE W REFLEX MICROSCOPIC
BILIRUBIN URINE: NEGATIVE
GLUCOSE, UA: NEGATIVE mg/dL
HGB URINE DIPSTICK: NEGATIVE
KETONES UR: NEGATIVE mg/dL
Leukocytes, UA: NEGATIVE
Nitrite: NEGATIVE
PH: 6 (ref 5.0–8.0)
Protein, ur: NEGATIVE mg/dL
Specific Gravity, Urine: 1.02 (ref 1.005–1.030)

## 2016-09-08 LAB — OB RESULTS CONSOLE GC/CHLAMYDIA: GC PROBE AMP, GENITAL: NEGATIVE

## 2016-09-08 MED ORDER — METRONIDAZOLE 500 MG PO TABS: 500.0000 mg | ORAL_TABLET | Freq: Two times a day (BID) | ORAL | 0 refills | Status: DC

## 2016-09-08 NOTE — MAU Note (Signed)
Pt presents to MAU with complaints of lower abdominal cramping. Pt states vaginal odor but denies any vaginal discharge or bleeding.

## 2016-09-08 NOTE — Discharge Instructions (Signed)

## 2016-09-08 NOTE — MAU Provider Note (Signed)
  History     CSN: [id number]  Arrival date and time: 09/08/16 1431   None     Chief Complaint  Patient presents with  . vaginal odor  . Abdominal Pain   HPI  21 yo G3P0020 at 3126w3d by eaerly ultrasound presenting today for the evaluation of a vaginal odor which has been present for the past 3 days. She denies any abnormal discharge or pruritis. She denies vaginal bleeding. She reports some bilateral lower abdominal pain intermittently. Patient is without any other concerns. She is scheduled to start prenatal care on 09/18/2016  Past Medical History:  Diagnosis Date  . Asthma    as a child , no inhaler  . Depression    history - no meds  . Missed abortion    no surgery required    Past Surgical History:  Procedure Laterality Date  . DILATION AND EVACUATION N/A 05/01/2016   Procedure: DILATATION AND EVACUATION for Retained Product;  Surgeon: Tereso NewcomerUgonna A Anyanwu, MD;  Location: [facility];  Service: Gynecology;  Laterality: N/A;  . WISDOM TOOTH EXTRACTION      History reviewed. No pertinent family history.  Social History  Substance Use Topics  . Smoking status: Current Some Day Smoker    Packs/day: 0.25    Years: 3.00    Types: Cigarettes  . Smokeless tobacco: Never Used  . Alcohol use No     Comment: occasionally     Allergies:  Allergies  Allergen Reactions  . Other Itching    Scented soaps/body wash  . Tomato Hives and Swelling    No prescriptions prior to admission.    Review of Systems  See pertinent in HPI  Physical Exam   Blood pressure 151/70, pulse 84, temperature 98 F (36.7 C), resp. rate 18, last menstrual period 06/16/2016, SpO2 100 %.  Physical Exam GENERAL: Well-developed, well-nourished female in no acute distress.  LUNGS: Clear to auscultation bilaterally.  HEART: Regular rate and rhythm. BREASTS: Symmetric in size. No palpable masses or lymphadenopathy, skin changes, or nipple drainage. ABDOMEN: Soft, nontender, nondistended. No  organomegaly. PELVIC: Normal external female genitalia. Vagina is pink and rugated.  Normal discharge. Normal appearing cervix. Uterus is normal in size. No adnexal mass or tenderness. Cervix is closed and long EXTREMITIES: No cyanosis, clubbing, or edema, 2+ distal pulses.  MAU Course  Procedures  MDM Microscopic wet-mount exam shows clue cells.   Assessment and Plan  21 yo G3P0020 at 11w3 with BV - Results reviewed with the patient - Rx Flagyl provided - Patient encouraged to continue taking PNV and keep appointment on 09/18/2016 to start prenatal care  Sue Graves 09/08/2016, 3:33 PM

## 2016-09-09 LAB — GC/CHLAMYDIA PROBE AMP (~~LOC~~) NOT AT ARMC
CHLAMYDIA, DNA PROBE: NEGATIVE
Neisseria Gonorrhea: NEGATIVE

## 2016-09-18 ENCOUNTER — Encounter: Payer: Self-pay | Admitting: Obstetrics and Gynecology

## 2016-09-18 ENCOUNTER — Ambulatory Visit (INDEPENDENT_AMBULATORY_CARE_PROVIDER_SITE_OTHER): Payer: Medicaid Other | Admitting: Obstetrics and Gynecology

## 2016-09-18 ENCOUNTER — Other Ambulatory Visit (HOSPITAL_COMMUNITY)
Admission: RE | Admit: 2016-09-18 | Discharge: 2016-09-18 | Disposition: A | Discharge status: Home / Self Care | Payer: Medicaid Other | Source: Ambulatory Visit | Attending: Obstetrics and Gynecology | Admitting: Obstetrics and Gynecology

## 2016-09-18 DIAGNOSIS — Z3491 Encounter for supervision of normal pregnancy, unspecified, first trimester: Secondary | ICD-10-CM | POA: Diagnosis not present

## 2016-09-18 DIAGNOSIS — Z01419 Encounter for gynecological examination (general) (routine) without abnormal findings: Secondary | ICD-10-CM | POA: Insufficient documentation

## 2016-09-18 DIAGNOSIS — Z720 Tobacco use: Secondary | ICD-10-CM | POA: Insufficient documentation

## 2016-09-18 DIAGNOSIS — Z3490 Encounter for supervision of normal pregnancy, unspecified, unspecified trimester: Secondary | ICD-10-CM | POA: Insufficient documentation

## 2016-09-18 LAB — OB RESULTS CONSOLE GBS: STREP GROUP B AG: POSITIVE

## 2016-09-18 MED ORDER — METRONIDAZOLE 0.75 % VA GEL: 1.0000 | Freq: Every day | VAGINAL | 1 refills | Status: DC

## 2016-09-18 NOTE — Progress Notes (Signed)
Patient is in the office for initial prenatal visit.

## 2016-09-18 NOTE — Progress Notes (Signed)
Subjective:  Sue Graves is a 21 y.o. G3P0020 at 4995w6d being seen today for her first OB visit. She was recently Dx with BV but is unable to swallow the Flagyl tablets. She also has some labial irritation from a razor. She denies any chronic medical problems or meds. Smokes 1/2 PPD. She is currently monitored for the following issues for this low-risk pregnancy and has Supervision of normal pregnancy, antepartum and Tobacco abuse on her problem list.  Patient reports no complaints.  Contractions: Not present. Vag. Bleeding: None.   . Denies leaking of fluid.   The following portions of the patient's history were reviewed and updated as appropriate: allergies, current medications, past family history, past medical history, past social history, past surgical history and problem list. Problem list updated.  Objective:   Vitals:   09/18/16 1359  BP: 115/81  Pulse: 78  Temp: 97.5 F (36.4 C)  Weight: 103 lb (46.7 kg)    Fetal Status:           General:  Alert, oriented and cooperative. Patient is in no acute distress.  Skin: Skin is warm and dry. No rash noted.   Cardiovascular: Normal heart rate noted  Respiratory: Normal respiratory effort, no problems with respiration noted  Abdomen: Soft, gravid, appropriate for gestational age. Pain/Pressure: Absent     Pelvic:  Cervical exam performed  Area of folliculitis noted      Extremities: Normal range of motion.  Edema: Trace  Mental Status: Normal mood and affect. Normal behavior. Normal judgment and thought content.  Breast sym supple, no nipple d/c masses or adenopathy  Urinalysis: Urine Protein: Negative Urine Glucose: Negative  Assessment and Plan:  Pregnancy: G3P0020 at 2395w6d  1. Encounter for supervision of normal pregnancy, antepartum, unspecified gravidity Prenatal care and labs reviewed with pt Declines Flu vaccine  2. Tobacco abuse Advised to quit  Preterm labor symptoms and general obstetric precautions including but  not limited to vaginal bleeding, contractions, leaking of fluid and fetal movement were reviewed in detail with the patient. Please refer to After Visit Summary for other counseling recommendations.  Return in about 4 weeks (around 10/16/2016) for OB visit.   Hermina StaggersMichael L Ervin, MD

## 2016-09-18 NOTE — Patient Instructions (Signed)
First Trimester of Pregnancy  The first trimester of pregnancy is from week 1 until the end of week 12 (months 1 through 3). A week after a sperm fertilizes an egg, the egg will implant on the wall of the uterus. This embryo will begin to develop into a baby. Genes from you and your partner are forming the baby. The female genes determine whether the baby is a boy or a girl. At 6-8 weeks, the eyes and face are formed, and the heartbeat can be seen on ultrasound. At the end of 12 weeks, all the baby's organs are formed.   Now that you are pregnant, you will want to do everything you can to have a healthy baby. Two of the most important things are to get good prenatal care and to follow your health care provider's instructions. Prenatal care is all the medical care you receive before the baby's birth. This care will help prevent, find, and treat any problems during the pregnancy and childbirth.  BODY CHANGES  Your body goes through many changes during pregnancy. The changes vary from woman to woman.   · You may gain or lose a couple of pounds at first.  · You may feel sick to your stomach (nauseous) and throw up (vomit). If the vomiting is uncontrollable, call your health care provider.  · You may tire easily.  · You may develop headaches that can be relieved by medicines approved by your health care provider.  · You may urinate more often. Painful urination may mean you have a bladder infection.  · You may develop heartburn as a result of your pregnancy.  · You may develop constipation because certain hormones are causing the muscles that push waste through your intestines to slow down.  · You may develop hemorrhoids or swollen, bulging veins (varicose veins).  · Your breasts may begin to grow larger and become tender. Your nipples may stick out more, and the tissue that surrounds them (areola) may become darker.  · Your gums may bleed and may be sensitive to brushing and flossing.   · Dark spots or blotches (chloasma, mask of pregnancy) may develop on your face. This will likely fade after the baby is born.  · Your menstrual periods will stop.  · You may have a loss of appetite.  · You may develop cravings for certain kinds of food.  · You may have changes in your emotions from day to day, such as being excited to be pregnant or being concerned that something may go wrong with the pregnancy and baby.  · You may have more vivid and strange dreams.  · You may have changes in your hair. These can include thickening of your hair, rapid growth, and changes in texture. Some women also have hair loss during or after pregnancy, or hair that feels dry or thin. Your hair will most likely return to normal after your baby is born.  WHAT TO EXPECT AT YOUR PRENATAL VISITS  During a routine prenatal visit:  · You will be weighed to make sure you and the baby are growing normally.  · Your blood pressure will be taken.  · Your abdomen will be measured to track your baby's growth.  · The fetal heartbeat will be listened to starting around week 10 or 12 of your pregnancy.  · Test results from any previous visits will be discussed.  Your health care provider may ask you:  · How you are feeling.  · If you   are feeling the baby move.  · If you have had any abnormal symptoms, such as leaking fluid, bleeding, severe headaches, or abdominal cramping.  · If you are using any tobacco products, including cigarettes, chewing tobacco, and electronic cigarettes.  · If you have any questions.  Other tests that may be performed during your first trimester include:  · Blood tests to find your blood type and to check for the presence of any previous infections. They will also be used to check for low iron levels (anemia) and Rh antibodies. Later in the pregnancy, blood tests for diabetes will be done along with other tests if problems develop.  · Urine tests to check for infections, diabetes, or protein in the urine.   · An ultrasound to confirm the proper growth and development of the baby.  · An amniocentesis to check for possible genetic problems.  · Fetal screens for spina bifida and Down syndrome.  · You may need other tests to make sure you and the baby are doing well.  · HIV (human immunodeficiency virus) testing. Routine prenatal testing includes screening for HIV, unless you choose not to have this test.  HOME CARE INSTRUCTIONS   Medicines  · Follow your health care provider's instructions regarding medicine use. Specific medicines may be either safe or unsafe to take during pregnancy.  · Take your prenatal vitamins as directed.  · If you develop constipation, try taking a stool softener if your health care provider approves.  Diet  · Eat regular, well-balanced meals. Choose a variety of foods, such as meat or vegetable-based protein, fish, milk and low-fat dairy products, vegetables, fruits, and whole grain breads and cereals. Your health care provider will help you determine the amount of weight gain that is right for you.  · Avoid raw meat and uncooked cheese. These carry germs that can cause birth defects in the baby.  · Eating four or five small meals rather than three large meals a day may help relieve nausea and vomiting. If you start to feel nauseous, eating a few soda crackers can be helpful. Drinking liquids between meals instead of during meals also seems to help nausea and vomiting.  · If you develop constipation, eat more high-fiber foods, such as fresh vegetables or fruit and whole grains. Drink enough fluids to keep your urine clear or pale yellow.  Activity and Exercise  · Exercise only as directed by your health care provider. Exercising will help you:    Control your weight.    Stay in shape.    Be prepared for labor and delivery.  · Experiencing pain or cramping in the lower abdomen or low back is a good sign that you should stop exercising. Check with your health care provider  before continuing normal exercises.  · Try to avoid standing for long periods of time. Move your legs often if you must stand in one place for a long time.  · Avoid heavy lifting.  · Wear low-heeled shoes, and practice good posture.  · You may continue to have sex unless your health care provider directs you otherwise.  Relief of Pain or Discomfort  · Wear a good support bra for breast tenderness.    · Take warm sitz baths to soothe any pain or discomfort caused by hemorrhoids. Use hemorrhoid cream if your health care provider approves.    · Rest with your legs elevated if you have leg cramps or low back pain.  · If you develop varicose veins in your   legs, wear support hose. Elevate your feet for 15 minutes, 3-4 times a day. Limit salt in your diet.  Prenatal Care  · Schedule your prenatal visits by the twelfth week of pregnancy. They are usually scheduled monthly at first, then more often in the last 2 months before delivery.  · Write down your questions. Take them to your prenatal visits.  · Keep all your prenatal visits as directed by your health care provider.  Safety  · Wear your seat belt at all times when driving.  · Make a list of emergency phone numbers, including numbers for family, friends, the hospital, and police and fire departments.  General Tips  · Ask your health care provider for a referral to a local prenatal education class. Begin classes no later than at the beginning of month 6 of your pregnancy.  · Ask for help if you have counseling or nutritional needs during pregnancy. Your health care provider can offer advice or refer you to specialists for help with various needs.  · Do not use hot tubs, steam rooms, or saunas.  · Do not douche or use tampons or scented sanitary pads.  · Do not cross your legs for long periods of time.  · Avoid cat litter boxes and soil used by cats. These carry germs that can cause birth defects in the baby and possibly loss of the fetus by miscarriage or stillbirth.   · Avoid all smoking, herbs, alcohol, and medicines not prescribed by your health care provider. Chemicals in these affect the formation and growth of the baby.  · Do not use any tobacco products, including cigarettes, chewing tobacco, and electronic cigarettes. If you need help quitting, ask your health care provider. You may receive counseling support and other resources to help you quit.  · Schedule a dentist appointment. At home, brush your teeth with a soft toothbrush and be gentle when you floss.  SEEK MEDICAL CARE IF:   · You have dizziness.  · You have mild pelvic cramps, pelvic pressure, or nagging pain in the abdominal area.  · You have persistent nausea, vomiting, or diarrhea.  · You have a bad smelling vaginal discharge.  · You have pain with urination.  · You notice increased swelling in your face, hands, legs, or ankles.  SEEK IMMEDIATE MEDICAL CARE IF:   · You have a fever.  · You are leaking fluid from your vagina.  · You have spotting or bleeding from your vagina.  · You have severe abdominal cramping or pain.  · You have rapid weight gain or loss.  · You vomit blood or material that looks like coffee grounds.  · You are exposed to German measles and have never had them.  · You are exposed to fifth disease or chickenpox.  · You develop a severe headache.  · You have shortness of breath.  · You have any kind of trauma, such as from a fall or a car accident.     This information is not intended to replace advice given to you by your health care provider. Make sure you discuss any questions you have with your health care provider.     Document Released: 07/30/2001 Document Revised: 08/26/2014 Document Reviewed: 06/15/2013  Elsevier Interactive Patient Education ©2017 [employer]

## 2016-09-18 NOTE — Addendum Note (Signed)
Addended by: Hermina StaggersERVIN, Arliss Hepburn L on: 09/18/2016 02:55 PM   Modules accepted: Orders

## 2016-09-18 NOTE — Addendum Note (Signed)
Addended by: Natale MilchSTALLING, BRITTANY D on: 09/18/2016 04:34 PM   Modules accepted: Orders

## 2016-09-20 LAB — OBSTETRIC PANEL, INCLUDING HIV
Antibody Screen: NEGATIVE
BASOS ABS: 0 10*3/uL (ref 0.0–0.2)
Basos: 0 %
EOS (ABSOLUTE): 0 10*3/uL (ref 0.0–0.4)
EOS: 1 %
HEMOGLOBIN: 12.4 g/dL (ref 11.1–15.9)
HEP B S AG: NEGATIVE
HIV Screen 4th Generation wRfx: NONREACTIVE
Hematocrit: 36.5 % (ref 34.0–46.6)
IMMATURE GRANS (ABS): 0 10*3/uL (ref 0.0–0.1)
IMMATURE GRANULOCYTES: 0 %
LYMPHS: 28 %
Lymphocytes Absolute: 2.2 10*3/uL (ref 0.7–3.1)
MCH: 31.8 pg (ref 26.6–33.0)
MCHC: 34 g/dL (ref 31.5–35.7)
MCV: 94 fL (ref 79–97)
MONOCYTES: 6 %
Monocytes Absolute: 0.5 10*3/uL (ref 0.1–0.9)
NEUTROS PCT: 65 %
Neutrophils Absolute: 5.4 10*3/uL (ref 1.4–7.0)
PLATELETS: 285 10*3/uL (ref 150–379)
RBC: 3.9 x10E6/uL (ref 3.77–5.28)
RDW: 15.1 % (ref 12.3–15.4)
RH TYPE: POSITIVE
RPR: NONREACTIVE
RUBELLA: 9.95 {index} (ref >0.99)
WBC: 8.2 10*3/uL (ref 3.4–10.8)

## 2016-09-20 LAB — HEMOGLOBINOPATHY EVALUATION
HEMOGLOBIN A2 QUANTITATION: 2.6 % (ref 1.8–3.2)
HEMOGLOBIN F QUANTITATION: 0 % (ref 0.0–2.0)
HGB A: 97.4 % (ref 96.4–98.8)
HGB C: 0 %
HGB S: 0 %
HGB VARIANT: 0 %

## 2016-09-20 LAB — VARICELLA ZOSTER ANTIBODY, IGG: VARICELLA: 2780 {index} (ref >165)

## 2016-09-23 ENCOUNTER — Other Ambulatory Visit: Payer: Self-pay

## 2016-09-23 DIAGNOSIS — O2340 Unspecified infection of urinary tract in pregnancy, unspecified trimester: Secondary | ICD-10-CM

## 2016-09-23 DIAGNOSIS — B951 Streptococcus, group B, as the cause of diseases classified elsewhere: Secondary | ICD-10-CM | POA: Insufficient documentation

## 2016-09-23 DIAGNOSIS — O2342 Unspecified infection of urinary tract in pregnancy, second trimester: Secondary | ICD-10-CM

## 2016-09-23 LAB — CYTOLOGY - PAP: Diagnosis: NEGATIVE

## 2016-09-23 LAB — CULTURE, OB URINE

## 2016-09-23 LAB — URINE CULTURE, OB REFLEX

## 2016-09-23 MED ORDER — CEPHALEXIN 500 MG PO CAPS: 500.0000 mg | ORAL_CAPSULE | Freq: Three times a day (TID) | ORAL | 0 refills | Status: AC

## 2016-09-23 NOTE — Progress Notes (Signed)
Pt aware of test results and rx. GBS added to problem list.

## 2016-09-24 ENCOUNTER — Other Ambulatory Visit: Payer: Self-pay

## 2016-09-24 DIAGNOSIS — B3731 Acute candidiasis of vulva and vagina: Secondary | ICD-10-CM

## 2016-09-24 DIAGNOSIS — B373 Candidiasis of vulva and vagina: Secondary | ICD-10-CM

## 2016-09-24 MED ORDER — TERCONAZOLE 0.4 % VA CREA: 1.0000 | TOPICAL_CREAM | Freq: Every day | VAGINAL | 0 refills | Status: AC

## 2016-09-24 NOTE — Progress Notes (Signed)
Can

## 2016-09-26 LAB — TOXASSURE SELECT 13 (MW), URINE

## 2016-10-16 ENCOUNTER — Inpatient Hospital Stay (HOSPITAL_COMMUNITY)
Admission: AD | Admit: 2016-10-16 | Discharge: 2016-10-16 | Disposition: A | Discharge status: Home / Self Care | Payer: Medicaid Other | Source: Ambulatory Visit | Attending: Obstetrics & Gynecology | Admitting: Obstetrics & Gynecology

## 2016-10-16 DIAGNOSIS — F17210 Nicotine dependence, cigarettes, uncomplicated: Secondary | ICD-10-CM | POA: Diagnosis not present

## 2016-10-16 DIAGNOSIS — Z3A16 16 weeks gestation of pregnancy: Secondary | ICD-10-CM | POA: Diagnosis not present

## 2016-10-16 DIAGNOSIS — O99332 Smoking (tobacco) complicating pregnancy, second trimester: Secondary | ICD-10-CM | POA: Insufficient documentation

## 2016-10-16 DIAGNOSIS — R102 Pelvic and perineal pain: Secondary | ICD-10-CM | POA: Diagnosis not present

## 2016-10-16 DIAGNOSIS — R109 Unspecified abdominal pain: Secondary | ICD-10-CM | POA: Diagnosis present

## 2016-10-16 DIAGNOSIS — O26892 Other specified pregnancy related conditions, second trimester: Secondary | ICD-10-CM | POA: Diagnosis not present

## 2016-10-16 DIAGNOSIS — N949 Unspecified condition associated with female genital organs and menstrual cycle: Secondary | ICD-10-CM

## 2016-10-16 LAB — URINALYSIS, MICROSCOPIC (REFLEX)

## 2016-10-16 LAB — URINALYSIS, ROUTINE W REFLEX MICROSCOPIC
BILIRUBIN URINE: NEGATIVE
GLUCOSE, UA: NEGATIVE mg/dL
Ketones, ur: NEGATIVE mg/dL
Nitrite: NEGATIVE
PROTEIN: NEGATIVE mg/dL
Specific Gravity, Urine: 1.02 (ref 1.005–1.030)
pH: 7 (ref 5.0–8.0)

## 2016-10-16 NOTE — MAU Note (Addendum)
Pt states that she was recently treated for UTI and bacterial infection. Pt states that she did not finish medication for UTI but finished vaginal gel fo BV. States that she still feels like she has a bacterial infection-notices yellow discharge when she wipes occasionally. Denies odor, itching, pain with urination, vaginal bleeding. Having some lower abdominal cramping that she rates 3/10. Pain is worse with movement.

## 2016-10-16 NOTE — MAU Provider Note (Signed)
History     CSN: [id number]  Arrival date and time: 10/16/16 [id number]   First Provider Initiated Contact with Patient 10/16/16 2143      Chief Complaint  Patient presents with  . Pelvic Pain   Sue Graves is a 21 y.o. G3P0020 at 487w6d who presents today with abdominal pain. She states that she was on treatment for a UTI, but she did not finish taking all of it. She denies any VB or vaginal discharge.    Pelvic Pain  The patient's primary symptoms include pelvic pain. The patient's pertinent negatives include no vaginal discharge. This is a new problem. The current episode started today. The problem occurs intermittently. The problem has been resolved. The patient is experiencing no pain. The problem affects both sides. She is pregnant. Associated symptoms include frequency and urgency. Pertinent negatives include no chills, constipation, diarrhea, dysuria, fever, nausea or vomiting. The vaginal discharge was normal. There has been no bleeding. Nothing aggravates the symptoms. Treatments tried: resting  The treatment provided significant relief. Menstrual history: LMP 06/16/17.   Past Medical History:  Diagnosis Date  . Asthma    as a child , no inhaler  . Depression    history - no meds  . Missed abortion    no surgery required    Past Surgical History:  Procedure Laterality Date  . DILATION AND EVACUATION N/A 05/01/2016   Procedure: DILATATION AND EVACUATION for Retained Product;  Surgeon: Tereso NewcomerUgonna A Anyanwu, MD;  Location: [facility];  Service: Gynecology;  Laterality: N/A;  . WISDOM TOOTH EXTRACTION      Family History  Problem Relation Age of Onset  . Diabetes Maternal Aunt     Social History  Substance Use Topics  . Smoking status: Current Some Day Smoker    Packs/day: 0.25    Years: 3.00    Types: Cigarettes  . Smokeless tobacco: Never Used  . Alcohol use No     Comment: occasionally     Allergies:  Allergies  Allergen Reactions  . Other Itching    Scented  soaps/body wash  . Tomato Hives and Swelling    Prescriptions Prior to Admission  Medication Sig Dispense Refill Last Dose  . Prenatal Vit-Fe Fumarate-FA (PRENATAL MULTIVITAMIN) TABS tablet Take 1 tablet by mouth daily at 12 noon.   Past Week at Unknown time  . metroNIDAZOLE (FLAGYL) 500 MG tablet Take 1 tablet (500 mg total) by mouth 2 (two) times daily. (Patient not taking: Reported on 10/16/2016) 14 tablet 0 Not Taking at Unknown time  . metroNIDAZOLE (METROGEL) 0.75 % vaginal gel Place 1 Applicatorful vaginally at bedtime. Apply one applicatorful to vagina at bedtime for 5 days (Patient not taking: Reported on 10/16/2016) 70 g 1 Not Taking at Unknown time    Review of Systems  Constitutional: Negative for chills and fever.  Gastrointestinal: Negative for constipation, diarrhea, nausea and vomiting.  Genitourinary: Positive for frequency, pelvic pain and urgency. Negative for dysuria, vaginal bleeding and vaginal discharge.   Physical Exam   Blood pressure 119/57, pulse 84, temperature 98.4 F (36.9 C), temperature source Oral, resp. rate 18, height 4\' 11"  (1.499 m), weight 109 lb (49.4 kg), last menstrual period 06/16/2016, SpO2 97 %.  Physical Exam  Nursing note and vitals reviewed. Constitutional: She is oriented to person, place, and time. She appears well-developed and well-nourished.  HENT:  Head: Normocephalic.  Cardiovascular: Normal rate.   Respiratory: Effort normal.  GI: Soft. There is no tenderness.  Neurological: She is alert  and oriented to person, place, and time.  Skin: Skin is warm and dry.  Psychiatric: She has a normal mood and affect.    FHT 144 with doppler  MAU Course  Procedures  MDM Patient refuses pelvic exam today. She states that she has to leave at this time and all she wanted to know today was if she had a UTI. UA is mostly clear, will culture to be certain of any need for abx.   Assessment and Plan   1. Round ligament pain   2. [redacted] weeks  gestation of pregnancy    DC home Comfort measures reviewed  UC pending  2nd Trimester precautions  RX: none  Return to MAU as needed FU with OB as planned  Follow-up Information    [facility] [place] Follow up.   Specialty:  Obstetrics and Gynecology Contact information: [address], [address] [place] [address] [phone]           Tawnya Crook 10/16/2016, 9:45 PM

## 2016-10-16 NOTE — Discharge Instructions (Signed)

## 2016-10-18 LAB — CULTURE, OB URINE: Culture: >=100000 — AB

## 2016-10-29 ENCOUNTER — Ambulatory Visit (INDEPENDENT_AMBULATORY_CARE_PROVIDER_SITE_OTHER): Payer: Medicaid Other | Admitting: Obstetrics and Gynecology

## 2016-10-29 VITALS — BP 115/71 | HR 99 | Wt 112.6 lb

## 2016-10-29 DIAGNOSIS — Z3490 Encounter for supervision of normal pregnancy, unspecified, unspecified trimester: Secondary | ICD-10-CM

## 2016-10-29 DIAGNOSIS — O2342 Unspecified infection of urinary tract in pregnancy, second trimester: Secondary | ICD-10-CM

## 2016-10-29 DIAGNOSIS — B951 Streptococcus, group B, as the cause of diseases classified elsewhere: Secondary | ICD-10-CM

## 2016-10-29 NOTE — Progress Notes (Signed)
   PRENATAL VISIT NOTE  Subjective:  Sue Graves is a 21 y.o. G3P0020 at 3351w5d being seen today for ongoing prenatal care.  She is currently monitored for the following issues for this low-risk pregnancy and has Supervision of normal pregnancy, antepartum; Tobacco abuse; and GBS (group B streptococcus) UTI complicating pregnancy on her problem list.  Patient reports no complaints.  Contractions: Not present. Vag. Bleeding: None.  Movement: Present. Denies leaking of fluid.   The following portions of the patient's history were reviewed and updated as appropriate: allergies, current medications, past family history, past medical history, past social history, past surgical history and problem list. Problem list updated.  Objective:   Vitals:   10/29/16 1535  BP: 115/71  Pulse: 99  Weight: 112 lb 9.6 oz (51.1 kg)    Fetal Status: Fetal Heart Rate (bpm): 155   Movement: Present     General:  Alert, oriented and cooperative. Patient is in no acute distress.  Skin: Skin is warm and dry. No rash noted.   Cardiovascular: Normal heart rate noted  Respiratory: Normal respiratory effort, no problems with respiration noted  Abdomen: Soft, gravid, appropriate for gestational age. Pain/Pressure: Present     Pelvic:  Cervical exam deferred        Extremities: Normal range of motion.     Mental Status: Normal mood and affect. Normal behavior. Normal judgment and thought content.   Assessment and Plan:  Pregnancy: G3P0020 at 1651w5d  1. Encounter for supervision of normal pregnancy, antepartum, unspecified gravidity Patient is doing well without complaints Anatomy ultrasound ordered Patient declined quad screen - US MFM OB COMP + 14 WK; Future  2. Group B Streptococcus urinary tract infection affecting pregnancy in second trimester Will offer prophylaxis in labor  General obstetric precautions including but not limited to vaginal bleeding, contractions, leaking of fluid and fetal movement  were reviewed in detail with the patient. Please refer to After Visit Summary for other counseling recommendations.  Return in about 4 weeks (around 11/26/2016) for ROB.   Catalina AntiguaPeggy Gracemarie Skeet, MD

## 2016-11-07 ENCOUNTER — Ambulatory Visit (HOSPITAL_COMMUNITY)
Admission: RE | Admit: 2016-11-07 | Discharge: 2016-11-07 | Disposition: A | Discharge status: Home / Self Care | Payer: Medicaid Other | Source: Ambulatory Visit | Attending: Obstetrics and Gynecology | Admitting: Obstetrics and Gynecology

## 2016-11-07 ENCOUNTER — Other Ambulatory Visit: Payer: Self-pay | Admitting: Obstetrics and Gynecology

## 2016-11-07 DIAGNOSIS — Z3490 Encounter for supervision of normal pregnancy, unspecified, unspecified trimester: Secondary | ICD-10-CM

## 2016-11-07 DIAGNOSIS — Z363 Encounter for antenatal screening for malformations: Secondary | ICD-10-CM | POA: Diagnosis not present

## 2016-11-07 DIAGNOSIS — O99322 Drug use complicating pregnancy, second trimester: Secondary | ICD-10-CM | POA: Insufficient documentation

## 2016-11-07 DIAGNOSIS — Z3A20 20 weeks gestation of pregnancy: Secondary | ICD-10-CM | POA: Diagnosis not present

## 2016-11-07 DIAGNOSIS — O99332 Smoking (tobacco) complicating pregnancy, second trimester: Secondary | ICD-10-CM | POA: Insufficient documentation

## 2016-11-26 ENCOUNTER — Ambulatory Visit (INDEPENDENT_AMBULATORY_CARE_PROVIDER_SITE_OTHER): Payer: Medicaid Other | Admitting: Obstetrics and Gynecology

## 2016-11-26 VITALS — BP 117/71 | HR 94 | Wt 112.3 lb

## 2016-11-26 DIAGNOSIS — Z3490 Encounter for supervision of normal pregnancy, unspecified, unspecified trimester: Secondary | ICD-10-CM

## 2016-11-26 DIAGNOSIS — B951 Streptococcus, group B, as the cause of diseases classified elsewhere: Secondary | ICD-10-CM

## 2016-11-26 DIAGNOSIS — O2342 Unspecified infection of urinary tract in pregnancy, second trimester: Secondary | ICD-10-CM

## 2016-11-26 NOTE — Patient Instructions (Signed)

## 2016-11-26 NOTE — Progress Notes (Signed)
Patient reports good fetal movement, denies contractions. 

## 2016-11-26 NOTE — Progress Notes (Signed)
Subjective:  Sue Graves is a 21 y.o. G3P0020 at [redacted]w[redacted]d being seen today for ongoing prenatal care.  She is currently monitored for the following issues for this low-risk pregnancy and has Supervision of normal pregnancy, antepartum; Tobacco abuse; and GBS (group B streptococcus) UTI complicating pregnancy on her problem list.  Patient reports no complaints.  Contractions: Not present. Vag. Bleeding: None.  Movement: Present. Denies leaking of fluid.   The following portions of the patient's history were reviewed and updated as appropriate: allergies, current medications, past family history, past medical history, past social history, past surgical history and problem list. Problem list updated.  Objective:   Vitals:   11/26/16 1405  BP: 117/71  Pulse: 94  Weight: 112 lb 4.8 oz (50.9 kg)    Fetal Status: Fetal Heart Rate (bpm): 142   Movement: Present     General:  Alert, oriented and cooperative. Patient is in no acute distress.  Skin: Skin is warm and dry. No rash noted.   Cardiovascular: Normal heart rate noted  Respiratory: Normal respiratory effort, no problems with respiration noted  Abdomen: Soft, gravid, appropriate for gestational age. Pain/Pressure: Present     Pelvic:  Cervical exam deferred        Extremities: Normal range of motion.  Edema: None  Mental Status: Normal mood and affect. Normal behavior. Normal judgment and thought content.   Urinalysis:      Assessment and Plan:  Pregnancy: G3P0020 at [redacted]w[redacted]d  1. Encounter for supervision of normal pregnancy, antepartum, unspecified gravidity Glucola next visit  2. Group B Streptococcus urinary tract infection affecting pregnancy in second trimester Treat while in labor  Preterm labor symptoms and general obstetric precautions including but not limited to vaginal bleeding, contractions, leaking of fluid and fetal movement were reviewed in detail with the patient. Please refer to After Visit Summary for other counseling  recommendations.  Return in about 4 weeks (around 12/24/2016) for OB visit.   Hermina Staggers, MD

## 2016-12-24 ENCOUNTER — Ambulatory Visit (INDEPENDENT_AMBULATORY_CARE_PROVIDER_SITE_OTHER): Payer: Medicaid Other | Admitting: Obstetrics and Gynecology

## 2016-12-24 ENCOUNTER — Other Ambulatory Visit: Payer: Medicaid Other

## 2016-12-24 VITALS — BP 120/75 | HR 79 | Wt 115.0 lb

## 2016-12-24 DIAGNOSIS — B951 Streptococcus, group B, as the cause of diseases classified elsewhere: Secondary | ICD-10-CM

## 2016-12-24 DIAGNOSIS — Z3482 Encounter for supervision of other normal pregnancy, second trimester: Secondary | ICD-10-CM

## 2016-12-24 DIAGNOSIS — Z3400 Encounter for supervision of normal first pregnancy, unspecified trimester: Secondary | ICD-10-CM

## 2016-12-24 DIAGNOSIS — O2342 Unspecified infection of urinary tract in pregnancy, second trimester: Secondary | ICD-10-CM

## 2016-12-24 NOTE — Progress Notes (Signed)
   PRENATAL VISIT NOTE  Subjective:  Sue Graves is a 21 y.o. G3P0020 at 109w5d being seen today for ongoing prenatal care.  She is currently monitored for the following issues for this low-risk pregnancy and has Supervision of normal pregnancy, antepartum; Tobacco abuse; and GBS (group B streptococcus) UTI complicating pregnancy on her problem list.  Patient reports no complaints.  Contractions: Not present. Vag. Bleeding: None.  Movement: Present. Denies leaking of fluid.   The following portions of the patient's history were reviewed and updated as appropriate: allergies, current medications, past family history, past medical history, past social history, past surgical history and problem list. Problem list updated.  Objective:   Vitals:   12/24/16 1001  BP: 120/75  Pulse: 79  Weight: 115 lb (52.2 kg)    Fetal Status: Fetal Heart Rate (bpm): 132 Fundal Height: 27 cm Movement: Present     General:  Alert, oriented and cooperative. Patient is in no acute distress.  Skin: Skin is warm and dry. No rash noted.   Cardiovascular: Normal heart rate noted  Respiratory: Normal respiratory effort, no problems with respiration noted  Abdomen: Soft, gravid, appropriate for gestational age. Pain/Pressure: Present     Pelvic:  Cervical exam deferred        Extremities: Normal range of motion.  Edema: Trace  Mental Status: Normal mood and affect. Normal behavior. Normal judgment and thought content.   Assessment and Plan:  Pregnancy: G3P0020 at 649w5d  1. Supervision of normal first pregnancy, antepartum Patient is doing well Patient experienced emesis following glucola. Will repeat at next visit Patient remains undecided on contraception and pediatrician  2. Group B Streptococcus urinary tract infection affecting pregnancy in second trimester Will provide prophylaxis in labor  Preterm labor symptoms and general obstetric precautions including but not limited to vaginal bleeding,  contractions, leaking of fluid and fetal movement were reviewed in detail with the patient. Please refer to After Visit Summary for other counseling recommendations.  Return in about 2 weeks (around 01/07/2017) for ROB, 2 hr glucola next visit.   Venice Marcucci, Gigi GinPeggy, MD

## 2016-12-24 NOTE — Progress Notes (Signed)
Patient threw up Glucose Tolerance beverage.

## 2016-12-25 LAB — CBC
HEMATOCRIT: 36 % (ref 34.0–46.6)
Hemoglobin: 12.2 g/dL (ref 11.1–15.9)
MCH: 32.7 pg (ref 26.6–33.0)
MCHC: 33.9 g/dL (ref 31.5–35.7)
MCV: 97 fL (ref 79–97)
PLATELETS: 268 10*3/uL (ref 150–379)
RBC: 3.73 x10E6/uL — ABNORMAL LOW (ref 3.77–5.28)
RDW: 13.2 % (ref 12.3–15.4)
WBC: 8 10*3/uL (ref 3.4–10.8)

## 2016-12-25 LAB — RPR: RPR Ser Ql: NONREACTIVE

## 2016-12-25 LAB — HIV ANTIBODY (ROUTINE TESTING W REFLEX): HIV SCREEN 4TH GENERATION: NONREACTIVE

## 2016-12-31 ENCOUNTER — Other Ambulatory Visit: Payer: Medicaid Other

## 2017-01-01 ENCOUNTER — Other Ambulatory Visit: Payer: Medicaid Other

## 2017-01-01 DIAGNOSIS — Z3400 Encounter for supervision of normal first pregnancy, unspecified trimester: Secondary | ICD-10-CM

## 2017-01-02 LAB — GLUCOSE TOLERANCE, 2 HOURS W/ 1HR
Glucose, 1 hour: 106 mg/dL (ref 65–179)
Glucose, 2 hour: 75 mg/dL (ref 65–152)
Glucose, Fasting: 69 mg/dL (ref 65–91)

## 2017-01-07 ENCOUNTER — Ambulatory Visit (INDEPENDENT_AMBULATORY_CARE_PROVIDER_SITE_OTHER): Payer: Medicaid Other | Admitting: Obstetrics & Gynecology

## 2017-01-07 DIAGNOSIS — Z3482 Encounter for supervision of other normal pregnancy, second trimester: Secondary | ICD-10-CM

## 2017-01-07 DIAGNOSIS — Z3400 Encounter for supervision of normal first pregnancy, unspecified trimester: Secondary | ICD-10-CM

## 2017-01-07 MED ORDER — RANITIDINE HCL 150 MG/10ML PO SYRP: 150.0000 mg | ORAL_SOLUTION | Freq: Two times a day (BID) | ORAL | 0 refills | Status: DC

## 2017-01-07 NOTE — Patient Instructions (Signed)
Third Trimester of Pregnancy The third trimester is from week 28 through week 40 (months 7 through 9). The third trimester is a time when the unborn baby (fetus) is growing rapidly. At the end of the ninth month, the fetus is about 20 inches in length and weighs 6-10 pounds. Body changes during your third trimester Your body will continue to go through many changes during pregnancy. The changes vary from woman to woman. During the third trimester:  Your weight will continue to increase. You can expect to gain 25-35 pounds (11-16 kg) by the end of the pregnancy.  You may begin to get stretch marks on your hips, abdomen, and breasts.  You may urinate more often because the fetus is moving lower into your pelvis and pressing on your bladder.  You may develop or continue to have heartburn. This is caused by increased hormones that slow down muscles in the digestive tract.  You may develop or continue to have constipation because increased hormones slow digestion and cause the muscles that push waste through your intestines to relax.  You may develop hemorrhoids. These are swollen veins (varicose veins) in the rectum that can itch or be painful.  You may develop swollen, bulging veins (varicose veins) in your legs.  You may have increased body aches in the pelvis, back, or thighs. This is due to weight gain and increased hormones that are relaxing your joints.  You may have changes in your hair. These can include thickening of your hair, rapid growth, and changes in texture. Some women also have hair loss during or after pregnancy, or hair that feels dry or thin. Your hair will most likely return to normal after your baby is born.  Your breasts will continue to grow and they will continue to become tender. A yellow fluid (colostrum) may leak from your breasts. This is the first milk you are producing for your baby.  Your belly button may stick out.  You may notice more swelling in your hands,  face, or ankles.  You may have increased tingling or numbness in your hands, arms, and legs. The skin on your belly may also feel numb.  You may feel short of breath because of your expanding uterus.  You may have more problems sleeping. This can be caused by the size of your belly, increased need to urinate, and an increase in your body's metabolism.  You may notice the fetus "dropping," or moving lower in your abdomen (lightening).  You may have increased vaginal discharge.  You may notice your joints feel loose and you may have pain around your pelvic bone.  What to expect at prenatal visits You will have prenatal exams every 2 weeks until week 36. Then you will have weekly prenatal exams. During a routine prenatal visit:  You will be weighed to make sure you and the baby are growing normally.  Your blood pressure will be taken.  Your abdomen will be measured to track your baby's growth.  The fetal heartbeat will be listened to.  Any test results from the previous visit will be discussed.  You may have a cervical check near your due date to see if your cervix has softened or thinned (effaced).  You will be tested for Group B streptococcus. This happens between 35 and 37 weeks.  Your health care provider may ask you:  What your birth plan is.  How you are feeling.  If you are feeling the baby move.  If you have had   any abnormal symptoms, such as leaking fluid, bleeding, severe headaches, or abdominal cramping.  If you are using any tobacco products, including cigarettes, chewing tobacco, and electronic cigarettes.  If you have any questions.  Other tests or screenings that may be performed during your third trimester include:  Blood tests that check for low iron levels (anemia).  Fetal testing to check the health, activity level, and growth of the fetus. Testing is done if you have certain medical conditions or if there are problems during the  pregnancy.  Nonstress test (NST). This test checks the health of your baby to make sure there are no signs of problems, such as the baby not getting enough oxygen. During this test, a belt is placed around your belly. The baby is made to move, and its heart rate is monitored during movement.  What is false labor? False labor is a condition in which you feel small, irregular tightenings of the muscles in the womb (contractions) that usually go away with rest, changing position, or drinking water. These are called Braxton Hicks contractions. Contractions may last for hours, days, or even weeks before true labor sets in. If contractions come at regular intervals, become more frequent, increase in intensity, or become painful, you should see your health care provider. What are the signs of labor?  Abdominal cramps.  Regular contractions that start at 10 minutes apart and become stronger and more frequent with time.  Contractions that start on the top of the uterus and spread down to the lower abdomen and back.  Increased pelvic pressure and dull back pain.  A watery or bloody mucus discharge that comes from the vagina.  Leaking of amniotic fluid. This is also known as your "water breaking." It could be a slow trickle or a gush. Let your health care provider know if it has a color or strange odor. If you have any of these signs, call your health care provider right away, even if it is before your due date. Follow these instructions at home: Medicines  Follow your health care provider's instructions regarding medicine use. Specific medicines may be either safe or unsafe to take during pregnancy.  Take a prenatal vitamin that contains at least 600 micrograms (mcg) of folic acid.  If you develop constipation, try taking a stool softener if your health care provider approves. Eating and drinking  Eat a balanced diet that includes fresh fruits and vegetables, whole grains, good sources of protein  such as meat, eggs, or tofu, and low-fat dairy. Your health care provider will help you determine the amount of weight gain that is right for you.  Avoid raw meat and uncooked cheese. These carry germs that can cause birth defects in the baby.  If you have low calcium intake from food, talk to your health care provider about whether you should take a daily calcium supplement.  Eat four or five small meals rather than three large meals a day.  Limit foods that are high in fat and processed sugars, such as fried and sweet foods.  To prevent constipation: ? Drink enough fluid to keep your urine clear or pale yellow. ? Eat foods that are high in fiber, such as fresh fruits and vegetables, whole grains, and beans. Activity  Exercise only as directed by your health care provider. Most women can continue their usual exercise routine during pregnancy. Try to exercise for 30 minutes at least 5 days a week. Stop exercising if you experience uterine contractions.  Avoid heavy   lifting.  Do not exercise in extreme heat or humidity, or at high altitudes.  Wear low-heel, comfortable shoes.  Practice good posture.  You may continue to have sex unless your health care provider tells you otherwise. Relieving pain and discomfort  Take frequent breaks and rest with your legs elevated if you have leg cramps or low back pain.  Take warm sitz baths to soothe any pain or discomfort caused by hemorrhoids. Use hemorrhoid cream if your health care provider approves.  Wear a good support bra to prevent discomfort from breast tenderness.  If you develop varicose veins: ? Wear support pantyhose or compression stockings as told by your healthcare provider. ? Elevate your feet for 15 minutes, 3-4 times a day. Prenatal care  Write down your questions. Take them to your prenatal visits.  Keep all your prenatal visits as told by your health care provider. This is important. Safety  Wear your seat belt at  all times when driving.  Make a list of emergency phone numbers, including numbers for family, friends, the hospital, and police and fire departments. General instructions  Avoid cat litter boxes and soil used by cats. These carry germs that can cause birth defects in the baby. If you have a cat, ask someone to clean the litter box for you.  Do not travel far distances unless it is absolutely necessary and only with the approval of your health care provider.  Do not use hot tubs, steam rooms, or saunas.  Do not drink alcohol.  Do not use any products that contain nicotine or tobacco, such as cigarettes and e-cigarettes. If you need help quitting, ask your health care provider.  Do not use any medicinal herbs or unprescribed drugs. These chemicals affect the formation and growth of the baby.  Do not douche or use tampons or scented sanitary pads.  Do not cross your legs for long periods of time.  To prepare for the arrival of your baby: ? Take prenatal classes to understand, practice, and ask questions about labor and delivery. ? Make a trial run to the hospital. ? Visit the hospital and tour the maternity area. ? Arrange for maternity or paternity leave through employers. ? Arrange for family and friends to take care of pets while you are in the hospital. ? Purchase a rear-facing car seat and make sure you know how to install it in your car. ? Pack your hospital bag. ? Prepare the baby's nursery. Make sure to remove all pillows and stuffed animals from the baby's crib to prevent suffocation.  Visit your dentist if you have not gone during your pregnancy. Use a soft toothbrush to brush your teeth and be gentle when you floss. Contact a health care provider if:  You are unsure if you are in labor or if your water has broken.  You become dizzy.  You have mild pelvic cramps, pelvic pressure, or nagging pain in your abdominal area.  You have lower back pain.  You have persistent  nausea, vomiting, or diarrhea.  You have an unusual or bad smelling vaginal discharge.  You have pain when you urinate. Get help right away if:  Your water breaks before 37 weeks.  You have regular contractions less than 5 minutes apart before 37 weeks.  You have a fever.  You are leaking fluid from your vagina.  You have spotting or bleeding from your vagina.  You have severe abdominal pain or cramping.  You have rapid weight loss or weight gain.    You have shortness of breath with chest pain.  You notice sudden or extreme swelling of your face, hands, ankles, feet, or legs.  Your baby makes fewer than 10 movements in 2 hours.  You have severe headaches that do not go away when you take medicine.  You have vision changes. Summary  The third trimester is from week 28 through week 40, months 7 through 9. The third trimester is a time when the unborn baby (fetus) is growing rapidly.  During the third trimester, your discomfort may increase as you and your baby continue to gain weight. You may have abdominal, leg, and back pain, sleeping problems, and an increased need to urinate.  During the third trimester your breasts will keep growing and they will continue to become tender. A yellow fluid (colostrum) may leak from your breasts. This is the first milk you are producing for your baby.  False labor is a condition in which you feel small, irregular tightenings of the muscles in the womb (contractions) that eventually go away. These are called Braxton Hicks contractions. Contractions may last for hours, days, or even weeks before true labor sets in.  Signs of labor can include: abdominal cramps; regular contractions that start at 10 minutes apart and become stronger and more frequent with time; watery or bloody mucus discharge that comes from the vagina; increased pelvic pressure and dull back pain; and leaking of amniotic fluid. This information is not intended to replace advice  given to you by your health care provider. Make sure you discuss any questions you have with your health care provider. Document Released: 07/30/2001 Document Revised: 01/11/2016 Document Reviewed: 10/06/2012 [employer] Interactive Patient Education  2017 [employer]  

## 2017-01-07 NOTE — Progress Notes (Signed)
   PRENATAL VISIT NOTE  Subjective:  Sue Graves is a 21 y.o. G3P0020 at 6241w5d being seen today for ongoing prenatal care.  She is currently monitored for the following issues for this low-risk pregnancy and has Supervision of normal pregnancy, antepartum; Tobacco abuse; and GBS (group B streptococcus) UTI complicating pregnancy on her problem list.  Patient reports heartburn and can't swallow pills.  Contractions: Not present. Vag. Bleeding: None.  Movement: Present. Denies leaking of fluid.   The following portions of the patient's history were reviewed and updated as appropriate: allergies, current medications, past family history, past medical history, past social history, past surgical history and problem list. Problem list updated.  Objective:   Vitals:   01/07/17 1111  BP: 122/76  Pulse: 91  Weight: 114 lb 9.6 oz (52 kg)    Fetal Status: Fetal Heart Rate (bpm): 144   Movement: Present     General:  Alert, oriented and cooperative. Patient is in no acute distress.  Skin: Skin is warm and dry. No rash noted.   Cardiovascular: Normal heart rate noted  Respiratory: Normal respiratory effort, no problems with respiration noted  Abdomen: Soft, gravid, appropriate for gestational age. Pain/Pressure: Absent     Pelvic:  Cervical exam deferred        Extremities: Normal range of motion.  Edema: Trace  Mental Status: Normal mood and affect. Normal behavior. Normal judgment and thought content.   Assessment and Plan:  Pregnancy: G3P0020 at 141w5d  1. Supervision of normal first pregnancy, antepartum  - ranitidine (ZANTAC) 150 MG/10ML syrup; Take 10 mLs (150 mg total) by mouth 2 (two) times daily.  Dispense: 300 mL; Refill: 0  Preterm labor symptoms and general obstetric precautions including but not limited to vaginal bleeding, contractions, leaking of fluid and fetal movement were reviewed in detail with the patient. Please refer to After Visit Summary for other counseling  recommendations.  Return in about 2 weeks (around 01/21/2017).   Scheryl DarterJames Arnold, MD

## 2017-01-07 NOTE — Progress Notes (Signed)
Patient reports good fetal movement, denies pain/contractions. 

## 2017-01-22 ENCOUNTER — Encounter: Payer: Medicaid Other | Admitting: Obstetrics and Gynecology

## 2017-01-24 IMAGING — US US OB COMP LESS 14 WK
1 series · 15 of 28 positions shown · non-contrast
Comparison: None.

CLINICAL DATA: Unsure of LMP. Nausea and vomiting. First trimester
pregnancy.

EXAM:
OBSTETRIC <14 WK ULTRASOUND
TECHNIQUE: Transabdominal ultrasound was performed for evaluation of the
gestation as well as the maternal uterus and adnexal regions.

[Series 1: us ob comp less 14 wk · 15 of 33 slices shown]
[im 1/33]
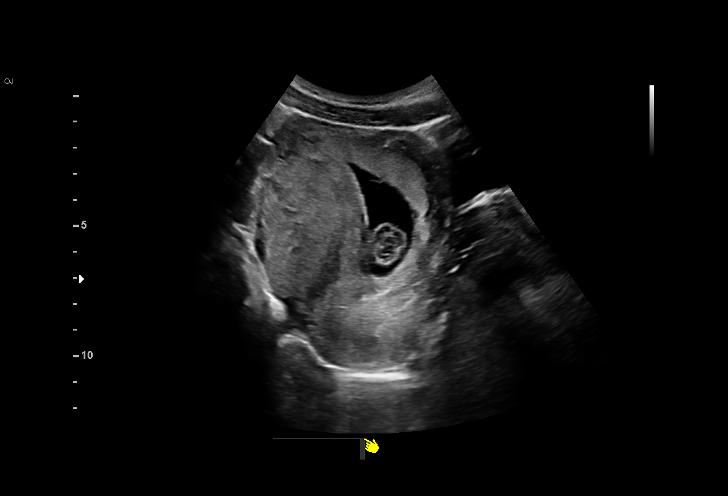
[im 3/33]
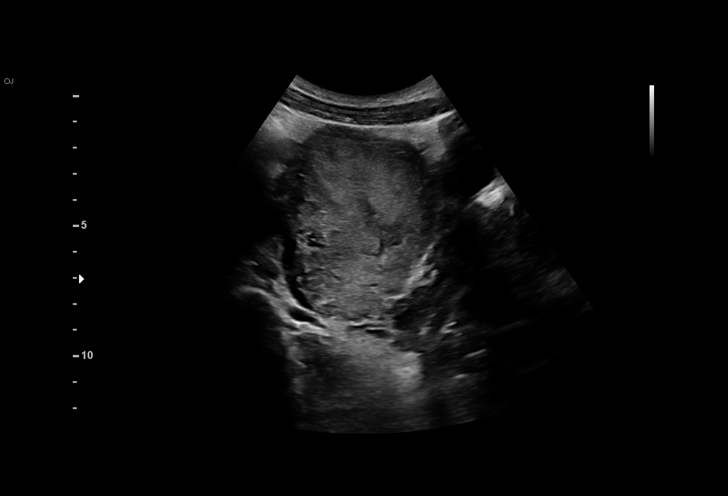
[im 5/33]
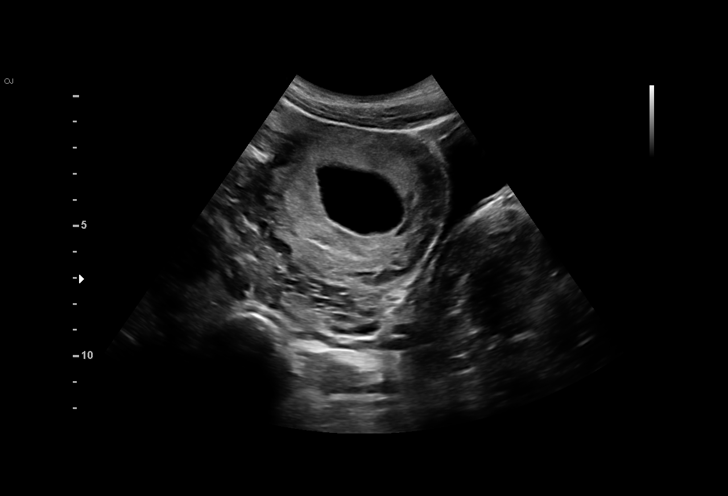
[im 8/33]
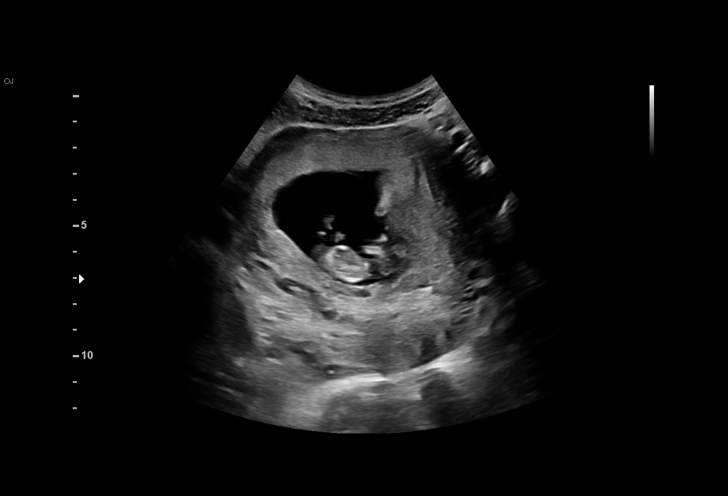
[im 10/33]
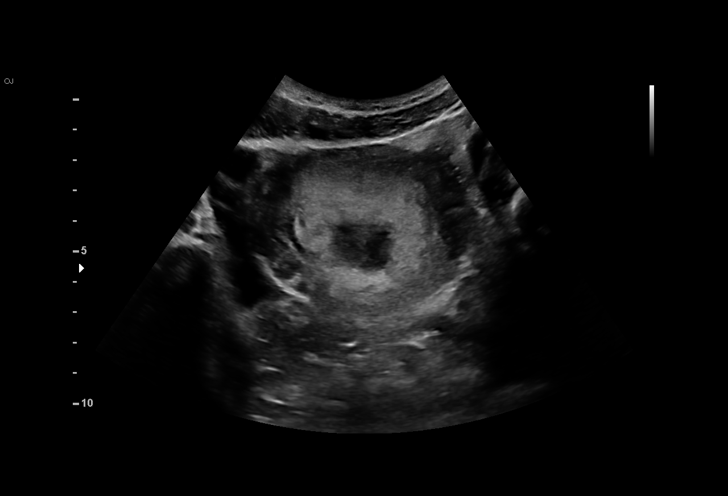
[im 12/33]
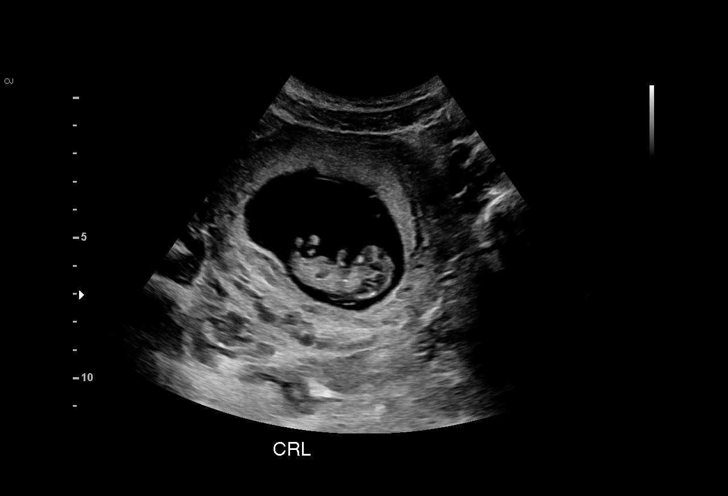
[im 15/33]
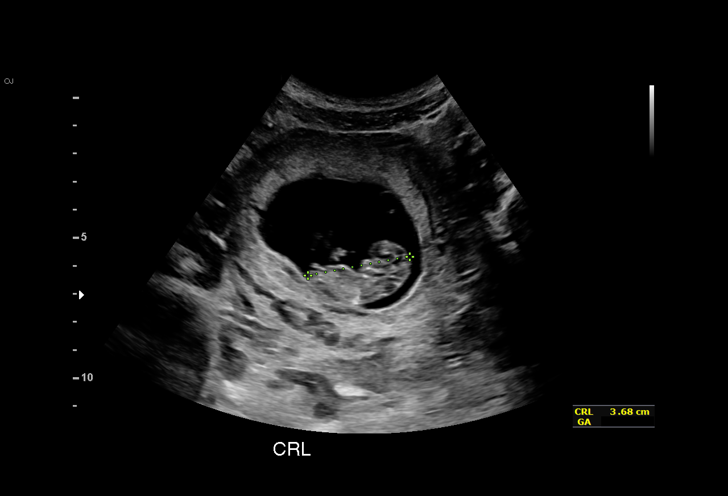
[im 17/33]
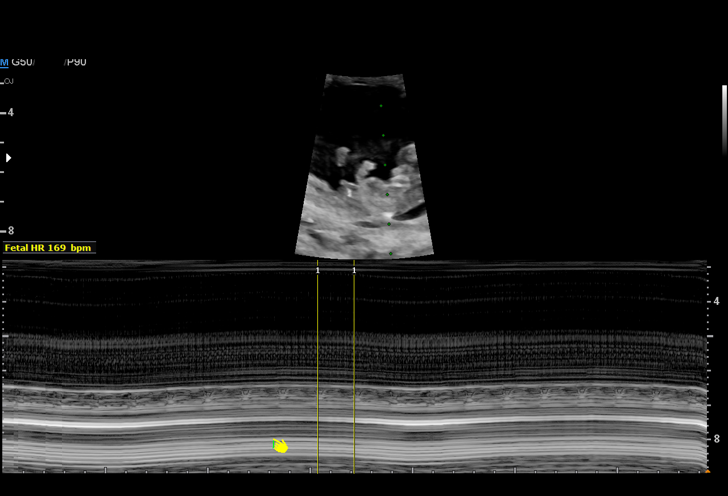
[im 18/33]
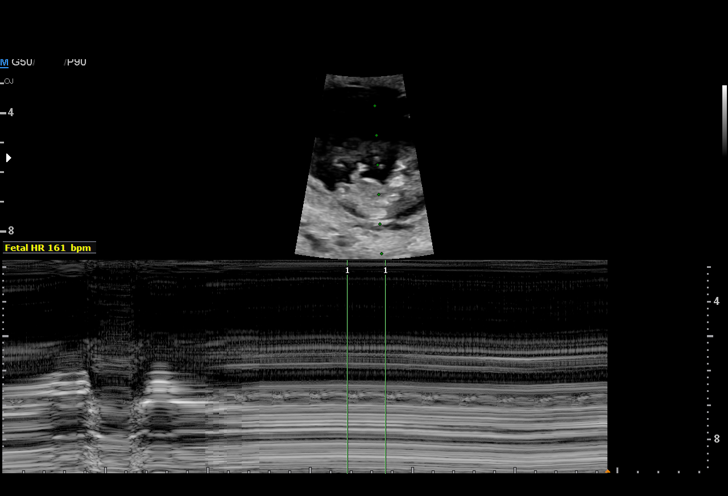
[im 21/33]
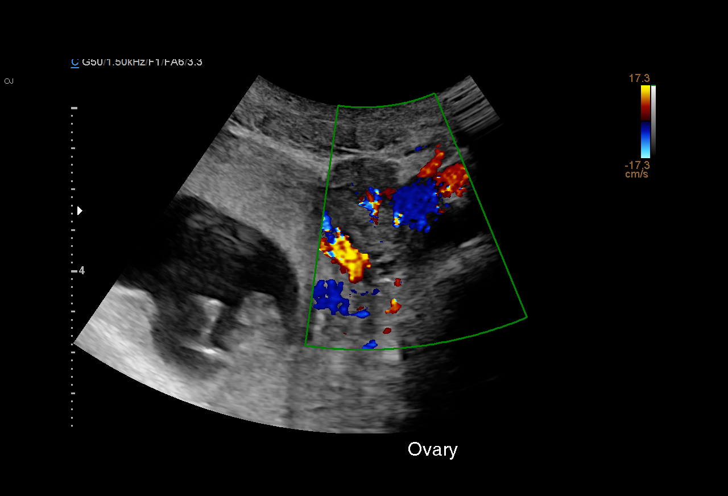
[im 23/33]
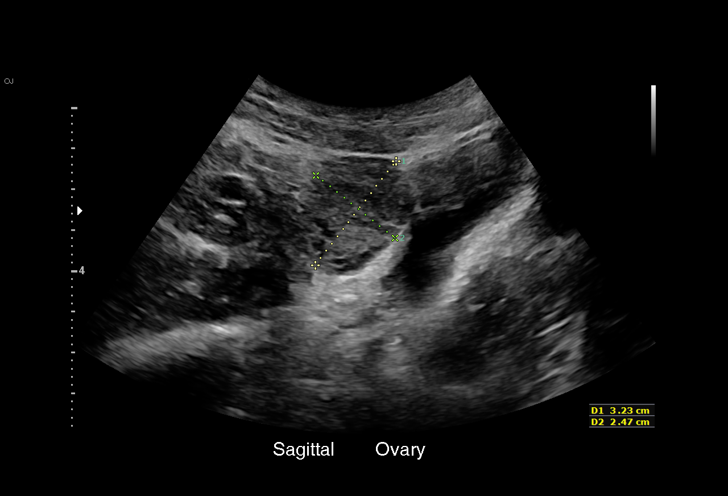
[im 25/33]
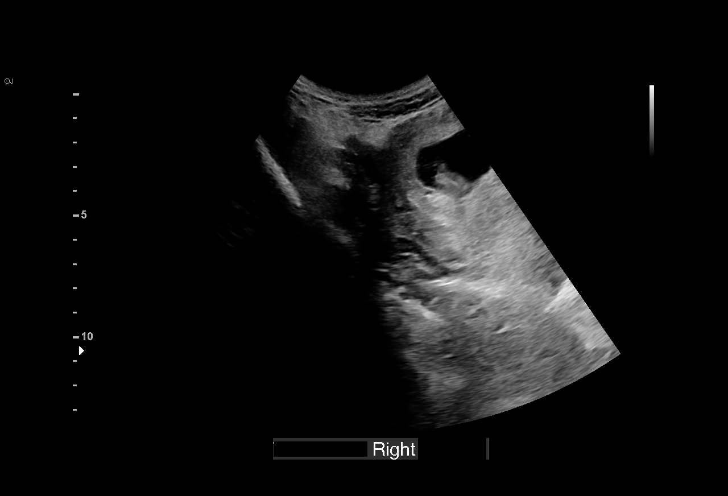
[im 28/33]
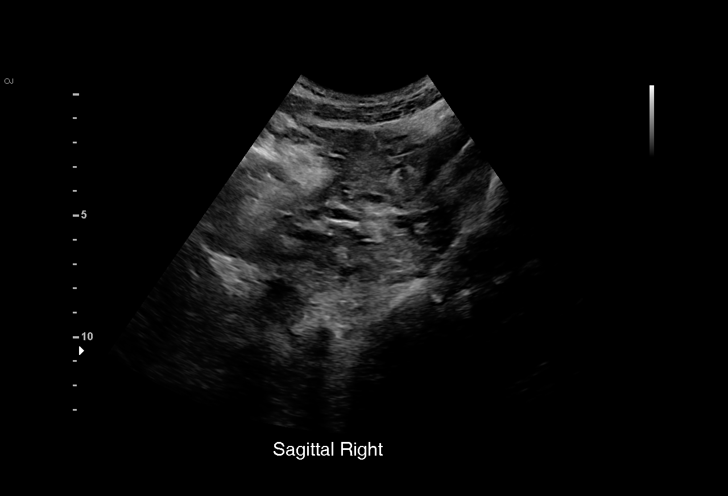
[im 30/33]
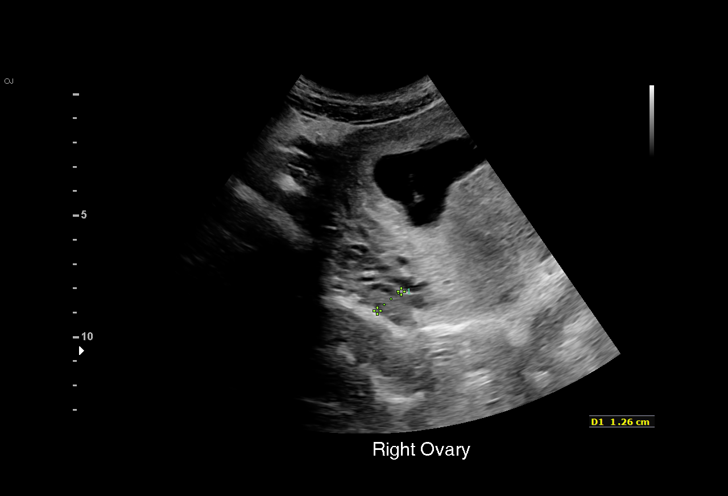
[im 33/33]
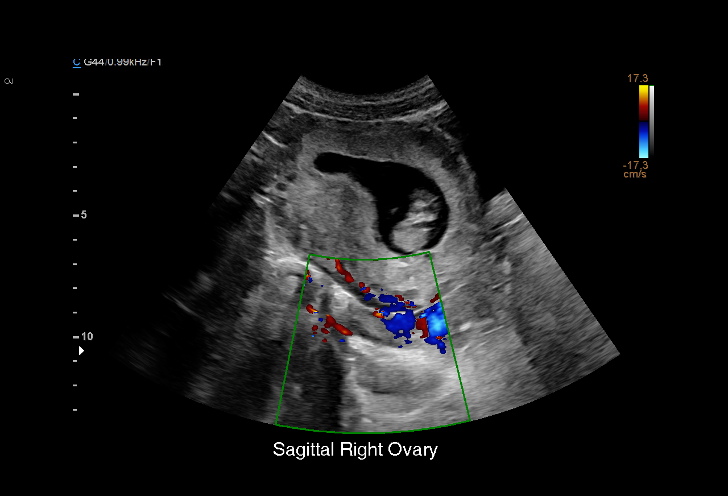

[15 of 28 positions shown; findings below may reference images not displayed]

FINDINGS: Intrauterine gestational sac: Single

Yolk sac:  Visualized.

Embryo:  Visualized.

Cardiac Activity: Visualized.

Heart Rate: 165 bpm

CRL:   36  mm   10 w 4 d                  US EDC: 03/27/2017

Subchorionic hemorrhage:  None visualized.

Maternal uterus/adnexae: Both ovaries are normal in appearance. No
mass or free fluid identified.
IMPRESSION: Single living IUP measuring 10 weeks 4 days with US EDC of
03/27/2017.

No significant maternal uterine or adnexal abnormality identified.

## 2017-01-27 ENCOUNTER — Ambulatory Visit (INDEPENDENT_AMBULATORY_CARE_PROVIDER_SITE_OTHER): Payer: Medicaid Other | Admitting: Obstetrics and Gynecology

## 2017-01-27 VITALS — BP 138/80 | HR 101 | Wt 118.4 lb

## 2017-01-27 DIAGNOSIS — Z3482 Encounter for supervision of other normal pregnancy, second trimester: Secondary | ICD-10-CM

## 2017-01-27 DIAGNOSIS — O2342 Unspecified infection of urinary tract in pregnancy, second trimester: Secondary | ICD-10-CM

## 2017-01-27 DIAGNOSIS — Z3400 Encounter for supervision of normal first pregnancy, unspecified trimester: Secondary | ICD-10-CM

## 2017-01-27 DIAGNOSIS — B951 Streptococcus, group B, as the cause of diseases classified elsewhere: Secondary | ICD-10-CM

## 2017-01-27 NOTE — Progress Notes (Signed)
   PRENATAL VISIT NOTE  Subjective:  Sue Graves is a 21 y.o. G3P0020 at [redacted]w[redacted]d being seen today for ongoing prenatal care.  She is currently monitored for the following issues for this low-risk pregnancy and has Supervision of normal pregnancy, antepartum; Tobacco abuse; and GBS (group B streptococcus) UTI complicating pregnancy on her problem list.  Patient reports no complaints.  Contractions: Not present. Vag. Bleeding: None.  Movement: Present. Denies leaking of fluid.   The following portions of the patient's history were reviewed and updated as appropriate: allergies, current medications, past family history, past medical history, past social history, past surgical history and problem list. Problem list updated.  Objective:   Vitals:   01/27/17 0929  BP: 138/80  Pulse: (!) 101  Weight: 118 lb 6.4 oz (53.7 kg)    Fetal Status: Fetal Heart Rate (bpm): 136   Movement: Present     General:  Alert, oriented and cooperative. Patient is in no acute distress.  Skin: Skin is warm and dry. No rash noted.   Cardiovascular: Normal heart rate noted  Respiratory: Normal respiratory effort, no problems with respiration noted  Abdomen: Soft, gravid, appropriate for gestational age. Pain/Pressure: Present     Pelvic:  Cervical exam deferred        Extremities: Normal range of motion.  Edema: None  Mental Status: Normal mood and affect. Normal behavior. Normal judgment and thought content.   Assessment and Plan:  Pregnancy: G3P0020 at [redacted]w[redacted]d  1. Supervision of normal first pregnancy, antepartum Patient is doing well without complaints Patient remains undecided on contraception Patient has not picked a pediatrician yet  2. Group B Streptococcus urinary tract infection affecting pregnancy in second trimester Will provide prophylaxis in labor  Preterm labor symptoms and general obstetric precautions including but not limited to vaginal bleeding, contractions, leaking of fluid and fetal  movement were reviewed in detail with the patient. Please refer to After Visit Summary for other counseling recommendations.  Return in about 2 weeks (around 02/10/2017) for ROB.   Catalina AntiguaPeggy Isaish Alemu, MD

## 2017-02-06 ENCOUNTER — Inpatient Hospital Stay (HOSPITAL_COMMUNITY)
Admission: AD | Admit: 2017-02-06 | Discharge: 2017-02-07 | Disposition: A | Discharge status: Home / Self Care | Payer: Medicaid Other | Source: Ambulatory Visit | Attending: Obstetrics & Gynecology | Admitting: Obstetrics & Gynecology

## 2017-02-06 ENCOUNTER — Encounter (HOSPITAL_COMMUNITY): Payer: Self-pay | Admitting: *Deleted

## 2017-02-06 DIAGNOSIS — F329 Major depressive disorder, single episode, unspecified: Secondary | ICD-10-CM | POA: Diagnosis not present

## 2017-02-06 DIAGNOSIS — O99513 Diseases of the respiratory system complicating pregnancy, third trimester: Secondary | ICD-10-CM | POA: Insufficient documentation

## 2017-02-06 DIAGNOSIS — R109 Unspecified abdominal pain: Secondary | ICD-10-CM

## 2017-02-06 DIAGNOSIS — F17210 Nicotine dependence, cigarettes, uncomplicated: Secondary | ICD-10-CM | POA: Insufficient documentation

## 2017-02-06 DIAGNOSIS — O99333 Smoking (tobacco) complicating pregnancy, third trimester: Secondary | ICD-10-CM | POA: Insufficient documentation

## 2017-02-06 DIAGNOSIS — O09893 Supervision of other high risk pregnancies, third trimester: Secondary | ICD-10-CM | POA: Diagnosis not present

## 2017-02-06 DIAGNOSIS — E876 Hypokalemia: Secondary | ICD-10-CM | POA: Diagnosis not present

## 2017-02-06 DIAGNOSIS — Z3400 Encounter for supervision of normal first pregnancy, unspecified trimester: Secondary | ICD-10-CM

## 2017-02-06 DIAGNOSIS — O2343 Unspecified infection of urinary tract in pregnancy, third trimester: Secondary | ICD-10-CM | POA: Diagnosis not present

## 2017-02-06 DIAGNOSIS — O26893 Other specified pregnancy related conditions, third trimester: Secondary | ICD-10-CM

## 2017-02-06 DIAGNOSIS — J45909 Unspecified asthma, uncomplicated: Secondary | ICD-10-CM | POA: Insufficient documentation

## 2017-02-06 DIAGNOSIS — O99283 Endocrine, nutritional and metabolic diseases complicating pregnancy, third trimester: Secondary | ICD-10-CM | POA: Insufficient documentation

## 2017-02-06 DIAGNOSIS — R102 Pelvic and perineal pain: Secondary | ICD-10-CM | POA: Insufficient documentation

## 2017-02-06 DIAGNOSIS — Z3A33 33 weeks gestation of pregnancy: Secondary | ICD-10-CM | POA: Diagnosis not present

## 2017-02-06 DIAGNOSIS — O99343 Other mental disorders complicating pregnancy, third trimester: Secondary | ICD-10-CM | POA: Insufficient documentation

## 2017-02-06 DIAGNOSIS — R112 Nausea with vomiting, unspecified: Secondary | ICD-10-CM | POA: Insufficient documentation

## 2017-02-06 DIAGNOSIS — O219 Vomiting of pregnancy, unspecified: Secondary | ICD-10-CM

## 2017-02-06 LAB — URINALYSIS, ROUTINE W REFLEX MICROSCOPIC
BILIRUBIN URINE: NEGATIVE
GLUCOSE, UA: NEGATIVE mg/dL
Ketones, ur: 5 mg/dL — AB
NITRITE: NEGATIVE
Protein, ur: NEGATIVE mg/dL
SPECIFIC GRAVITY, URINE: 1.004 — AB (ref 1.005–1.030)
pH: 7 (ref 5.0–8.0)

## 2017-02-06 LAB — COMPREHENSIVE METABOLIC PANEL
ALT: 14 U/L (ref 14–54)
ANION GAP: 8 (ref 5–15)
AST: 24 U/L (ref 15–41)
Albumin: 3 g/dL — ABNORMAL LOW (ref 3.5–5.0)
Alkaline Phosphatase: 106 U/L (ref 38–126)
BILIRUBIN TOTAL: 0.5 mg/dL (ref 0.3–1.2)
BUN: 7 mg/dL (ref 6–20)
CO2: 24 mmol/L (ref 22–32)
Calcium: 8.6 mg/dL — ABNORMAL LOW (ref 8.9–10.3)
Chloride: 104 mmol/L (ref 101–111)
Creatinine, Ser: 0.51 mg/dL (ref 0.44–1.00)
Glucose, Bld: 68 mg/dL (ref 65–99)
POTASSIUM: 2.9 mmol/L — AB (ref 3.5–5.1)
Sodium: 136 mmol/L (ref 135–145)
TOTAL PROTEIN: 5.8 g/dL — AB (ref 6.5–8.1)

## 2017-02-06 LAB — WET PREP, GENITAL
SPERM: NONE SEEN
TRICH WET PREP: NONE SEEN
Yeast Wet Prep HPF POC: NONE SEEN

## 2017-02-06 LAB — CBC
HEMATOCRIT: 30.2 % — AB (ref 36.0–46.0)
Hemoglobin: 10.5 g/dL — ABNORMAL LOW (ref 12.0–15.0)
MCH: 32.8 pg (ref 26.0–34.0)
MCHC: 34.8 g/dL (ref 30.0–36.0)
MCV: 94.4 fL (ref 78.0–100.0)
PLATELETS: 209 10*3/uL (ref 150–400)
RBC: 3.2 MIL/uL — ABNORMAL LOW (ref 3.87–5.11)
RDW: 13.2 % (ref 11.5–15.5)
WBC: 8.2 10*3/uL (ref 4.0–10.5)

## 2017-02-06 MED ORDER — ONDANSETRON 4 MG PO TBDP
4.0000 mg | ORAL_TABLET | Freq: Once | ORAL | Status: DC
  Filled 2017-02-06: qty 1

## 2017-02-06 MED ORDER — SODIUM CHLORIDE 0.9 % IV SOLN
Freq: Once | INTRAVENOUS | Status: DC
  Filled 2017-02-06: qty 1000

## 2017-02-06 MED ORDER — FAMOTIDINE IN NACL 20-0.9 MG/50ML-% IV SOLN
20.0000 mg | Freq: Once | INTRAVENOUS | Status: AC
  Administered 2017-02-06: 20 mg via INTRAVENOUS
  Filled 2017-02-06: qty 50

## 2017-02-06 MED ORDER — LACTATED RINGERS IV BOLUS (SEPSIS)
1000.0000 mL | Freq: Once | INTRAVENOUS | Status: AC
  Administered 2017-02-06: 1000 mL via INTRAVENOUS

## 2017-02-06 MED ORDER — ONDANSETRON HCL 4 MG/2ML IJ SOLN
4.0000 mg | Freq: Once | INTRAMUSCULAR | Status: AC
  Administered 2017-02-06: 4 mg via INTRAVENOUS
  Filled 2017-02-06: qty 2

## 2017-02-06 MED ORDER — POTASSIUM CHLORIDE CRYS ER 20 MEQ PO TBCR
40.0000 meq | EXTENDED_RELEASE_TABLET | Freq: Two times a day (BID) | ORAL | 0 refills | Status: DC
Start: 2017-02-06 — End: 2017-02-07

## 2017-02-06 MED ORDER — ONDANSETRON 4 MG PO TBDP: 4.0000 mg | ORAL_TABLET | Freq: Four times a day (QID) | ORAL | 0 refills | Status: DC | PRN

## 2017-02-06 MED ORDER — PROMETHAZINE HCL 25 MG PO TABS: 12.5000 mg | ORAL_TABLET | Freq: Four times a day (QID) | ORAL | 0 refills | Status: DC | PRN

## 2017-02-06 NOTE — MAU Provider Note (Addendum)
Obstetric Attending MAU Note  Chief Complaint:  Pressure and pain in pregnancy   First Provider Initiated Contact with Patient 02/06/17 2125     HPI: Sue Graves is a 21 y.o. G3P0020 at [redacted]w[redacted]d who presents to maternity admissions via EMS reporting pelvic pain and pressure since today.  Also reports nausea and vomiting, unable to keep anything down for a few days.  Reports feeling hot and "very dehydrated"; the air conditioning at her home has been off the past couple of days. Also concerned about increased yellow vaginal discharge, no odor, no irritation.  Denies leakage of fluid or vaginal bleeding. Good fetal movement.   Pregnancy Course: Receives care at [facility], last seen on 01/27/17. Patient Active Problem List   Diagnosis Date Noted  . GBS (group B streptococcus) UTI complicating pregnancy 09/23/2016  . Supervision of normal pregnancy, antepartum 09/18/2016  . Tobacco abuse 09/18/2016    Past Medical History:  Diagnosis Date  . Asthma    as a child , no inhaler  . Depression    history - no meds  . Missed abortion    no surgery required    OB History  Gravida Para Term Preterm AB Living  3       2    SAB TAB Ectopic Multiple Live Births  2            # Outcome Date GA Lbr Len/2nd Weight Sex Delivery Anes PTL Lv  3 Current           2 SAB 04/2016 [redacted]w[redacted]d         1 SAB 03/2016 [redacted]w[redacted]d             Past Surgical History:  Procedure Laterality Date  . DILATION AND EVACUATION N/A 05/01/2016   Procedure: DILATATION AND EVACUATION for Retained Product;  Surgeon: Tereso Newcomer, MD;  Location: [facility];  Service: Gynecology;  Laterality: N/A;  . WISDOM TOOTH EXTRACTION      Family History: Family History  Problem Relation Age of Onset  . Diabetes Maternal Aunt     Social History: Social History  Substance Use Topics  . Smoking status: Current Some Day Smoker    Packs/day: 0.25    Years: 3.00    Types: Cigarettes  . Smokeless tobacco: Never Used  . Alcohol use No   Comment: occasionally     Allergies:  Allergies  Allergen Reactions  . Other Itching    Scented soaps/body wash  . Tomato Hives and Swelling    No prescriptions prior to admission.    ROS: Pertinent findings in history of present illness.  Physical Exam  Blood pressure 117/73, pulse 80, temperature 98.7 F (37.1 C), temperature source Oral, resp. rate 18, last menstrual period 06/16/2016. CONSTITUTIONAL: Well-developed, well-nourished female in no acute distress.  HENT:  Normocephalic, atraumatic, External right and left ear normal. Oropharynx is clear and moist EYES: Conjunctivae and EOM are normal. Pupils are equal, round, and reactive to light. No scleral icterus.  NECK: Normal range of motion, supple, no masses SKIN: Skin is warm and dry. No rash noted. Not diaphoretic. No erythema. No pallor. NEUROLGIC: Alert and oriented to person, place, and time. Normal reflexes, muscle tone coordination. No cranial nerve deficit noted. PSYCHIATRIC: Normal mood and affect. Normal behavior. Normal judgment and thought content. CARDIOVASCULAR: Normal heart rate noted, regular rhythm RESPIRATORY: Effort and breath sounds normal, no problems with respiration noted ABDOMEN: Soft, nontender, nondistended, gravid appropriate for gestational age MUSCULOSKELETAL: Normal range of  motion. No edema and no tenderness. 2+ distal pulses.  PELVIC: NEFG, yellow discharge in introitus, pelvic cultures done, no blood. Cervix digitally closed/thick/high Dilation: Closed Effacement (%): Thick Cervical Position: Posterior Station: Ballotable Exam by:: Dr. Macon Large FHT:  Baseline 120 , moderate variability, accelerations present, no decelerations Contractions: None   Labs: Results for orders placed or performed during the hospital encounter of 02/06/17 (from the past 24 hour(s))  Urinalysis, Routine w reflex microscopic     Status: Abnormal   Collection Time: 02/06/17  9:20 PM  Result Value Ref Range    Color, Urine YELLOW YELLOW   APPearance HAZY (A) CLEAR   Specific Gravity, Urine 1.004 (L) 1.005 - 1.030   pH 7.0 5.0 - 8.0   Glucose, UA NEGATIVE NEGATIVE mg/dL   Hgb urine dipstick SMALL (A) NEGATIVE   Bilirubin Urine NEGATIVE NEGATIVE   Ketones, ur 5 (A) NEGATIVE mg/dL   Protein, ur NEGATIVE NEGATIVE mg/dL   Nitrite NEGATIVE NEGATIVE   Leukocytes, UA LARGE (A) NEGATIVE   RBC / HPF 6-30 0 - 5 RBC/hpf   WBC, UA TOO NUMEROUS TO COUNT 0 - 5 WBC/hpf   Bacteria, UA FEW (A) NONE SEEN   Squamous Epithelial / LPF 0-5 (A) NONE SEEN  Wet prep, genital     Status: Abnormal   Collection Time: 02/06/17  9:55 PM  Result Value Ref Range   Yeast Wet Prep HPF POC NONE SEEN NONE SEEN   Trich, Wet Prep NONE SEEN NONE SEEN   Clue Cells Wet Prep HPF POC PRESENT (A) NONE SEEN   WBC, Wet Prep HPF POC MANY (A) NONE SEEN   Sperm NONE SEEN   CBC     Status: Abnormal   Collection Time: 02/06/17 10:19 PM  Result Value Ref Range   WBC 8.2 4.0 - 10.5 K/uL   RBC 3.20 (L) 3.87 - 5.11 MIL/uL   Hemoglobin 10.5 (L) 12.0 - 15.0 g/dL   HCT 24.4 (L) 01.0 - 27.2 %   MCV 94.4 78.0 - 100.0 fL   MCH 32.8 26.0 - 34.0 pg   MCHC 34.8 30.0 - 36.0 g/dL   RDW 53.6 64.4 - 03.4 %   Platelets 209 150 - 400 K/uL  Comprehensive metabolic panel     Status: Abnormal   Collection Time: 02/06/17 10:19 PM  Result Value Ref Range   Sodium 136 135 - 145 mmol/L   Potassium 2.9 (L) 3.5 - 5.1 mmol/L   Chloride 104 101 - 111 mmol/L   CO2 24 22 - 32 mmol/L   Glucose, Bld 68 65 - 99 mg/dL   BUN 7 6 - 20 mg/dL   Creatinine, Ser 7.42 0.44 - 1.00 mg/dL   Calcium 8.6 (L) 8.9 - 10.3 mg/dL   Total Protein 5.8 (L) 6.5 - 8.1 g/dL   Albumin 3.0 (L) 3.5 - 5.0 g/dL   AST 24 15 - 41 U/L   ALT 14 14 - 54 U/L   Alkaline Phosphatase 106 38 - 126 U/L   Total Bilirubin 0.5 0.3 - 1.2 mg/dL   GFR calc non Af Amer >60 >60 mL/min   GFR calc Af Amer >60 >60 mL/min   Anion gap 8 5 - 15    Imaging:  No results found.  MAU Course: UA,  GC/Chlam, wet prep ordered. Zofran and Pepcid IV ordered. Labs ordered  Patient's care handed over to [facility], CNM. Please refer to her note for further details.  Hurshel Party, CNM 02/07/2017 3:07 AM  MDM: No evidence of preterm labor with reactive NST Likely mild dehydration contributing to uterine cramping  Pt reports n/v related to daily severe heartburn. She has not picked up Rx for Zantac.   CMP shows hypokalemia.  Recommend IV treatment in MAU but pt needs to go while her ride can take her so [name] to [facility] with PO potassium 40 meqs BID x 5 days. Pt can crush and mix with food as needed.  Rx for Phenergan and Zofran sent to pharmacy.   Urine sent for culture Preterm labor precautions reviewed Increase PO fluids [facility], return to MAU as needed for emergencies  A: 1. Nausea and vomiting during pregnancy   2. Abdominal pain during pregnancy, third trimester   3. Hypokalemia   4. Supervision of normal first pregnancy, antepartum     D/C home with preterm labor precautions F/U in office Return to MAU for emergencies  Sharen CounterLisa Leftwich-Kirby, CNM 3:11 AM

## 2017-02-06 NOTE — MAU Note (Signed)
"  it's like I'm too dehydrated and feeling pressure"

## 2017-02-07 LAB — GC/CHLAMYDIA PROBE AMP (~~LOC~~) NOT AT ARMC
Chlamydia: NEGATIVE
NEISSERIA GONORRHEA: NEGATIVE

## 2017-02-07 MED ORDER — POTASSIUM CHLORIDE CRYS ER 20 MEQ PO TBCR: 40.0000 meq | EXTENDED_RELEASE_TABLET | Freq: Two times a day (BID) | ORAL | 0 refills | Status: DC

## 2017-02-07 MED ORDER — PROMETHAZINE HCL 25 MG PO TABS: 12.5000 mg | ORAL_TABLET | Freq: Four times a day (QID) | ORAL | 0 refills | Status: DC | PRN

## 2017-02-07 MED ORDER — RANITIDINE HCL 150 MG/10ML PO SYRP: 150.0000 mg | ORAL_SOLUTION | Freq: Two times a day (BID) | ORAL | 0 refills | Status: DC

## 2017-02-07 MED ORDER — ONDANSETRON 4 MG PO TBDP: 4.0000 mg | ORAL_TABLET | Freq: Four times a day (QID) | ORAL | 0 refills | Status: DC | PRN

## 2017-02-10 ENCOUNTER — Ambulatory Visit (INDEPENDENT_AMBULATORY_CARE_PROVIDER_SITE_OTHER): Payer: Medicaid Other | Admitting: Obstetrics and Gynecology

## 2017-02-10 VITALS — BP 108/68 | HR 97 | Wt 124.3 lb

## 2017-02-10 DIAGNOSIS — O2343 Unspecified infection of urinary tract in pregnancy, third trimester: Secondary | ICD-10-CM

## 2017-02-10 DIAGNOSIS — Z3483 Encounter for supervision of other normal pregnancy, third trimester: Secondary | ICD-10-CM

## 2017-02-10 DIAGNOSIS — B951 Streptococcus, group B, as the cause of diseases classified elsewhere: Secondary | ICD-10-CM

## 2017-02-10 DIAGNOSIS — Z3400 Encounter for supervision of normal first pregnancy, unspecified trimester: Secondary | ICD-10-CM

## 2017-02-10 MED ORDER — COMFORT FIT MATERNITY SUPP MED MISC: 0 refills | Status: DC

## 2017-02-10 NOTE — Progress Notes (Signed)
   PRENATAL VISIT NOTE  Subjective:  Sue Graves is a 21 y.o. G3P0020 at 847w4d being seen today for ongoing prenatal care.  She is currently monitored for the following issues for this low-risk pregnancy and has Supervision of normal pregnancy, antepartum; Tobacco abuse; and GBS (group B streptococcus) UTI complicating pregnancy on her problem list.  Patient reports pelvic pressure.  Contractions: Not present. Vag. Bleeding: None.  Movement: Present. Denies leaking of fluid.   The following portions of the patient's history were reviewed and updated as appropriate: allergies, current medications, past family history, past medical history, past social history, past surgical history and problem list. Problem list updated.  Objective:   Vitals:   02/10/17 1555  BP: 108/68  Pulse: 97  Weight: 124 lb 4.8 oz (56.4 kg)    Fetal Status: Fetal Heart Rate (bpm): 138 Fundal Height: 34 cm Movement: Present     General:  Alert, oriented and cooperative. Patient is in no acute distress.  Skin: Skin is warm and dry. No rash noted.   Cardiovascular: Normal heart rate noted  Respiratory: Normal respiratory effort, no problems with respiration noted  Abdomen: Soft, gravid, appropriate for gestational age. Pain/Pressure: Present     Pelvic:  Cervical exam deferred        Extremities: Normal range of motion.  Edema: None  Mental Status: Normal mood and affect. Normal behavior. Normal judgment and thought content.   Assessment and Plan:  Pregnancy: G3P0020 at [redacted]w[redacted]d  1. Supervision of normal first pregnancy, antepartum Patient is doing well Rx maternity support belt provided Patient plans OCP for contraception and is scheduled to meet with a pediatrician  2. Group B Streptococcus urinary tract infection affecting pregnancy in third trimester Will provide prophylaxis in labor  Preterm labor symptoms and general obstetric precautions including but not limited to vaginal bleeding, contractions,  leaking of fluid and fetal movement were reviewed in detail with the patient. Please refer to After Visit Summary for other counseling recommendations.  Return in about 2 weeks (around 02/24/2017) for ROB.   Catalina AntiguaPeggy Orlo Brickle, MD

## 2017-02-10 NOTE — Progress Notes (Signed)
Patient reports good fetal movement, and feeling pressure.

## 2017-02-25 ENCOUNTER — Ambulatory Visit (INDEPENDENT_AMBULATORY_CARE_PROVIDER_SITE_OTHER): Payer: Self-pay | Admitting: Pediatrics

## 2017-02-25 DIAGNOSIS — Z7681 Expectant parent(s) prebirth pediatrician visit: Secondary | ICD-10-CM

## 2017-02-26 ENCOUNTER — Other Ambulatory Visit (HOSPITAL_COMMUNITY)
Admission: RE | Admit: 2017-02-26 | Discharge: 2017-02-26 | Disposition: A | Discharge status: Home / Self Care | Payer: Medicaid Other | Source: Ambulatory Visit | Attending: Obstetrics & Gynecology | Admitting: Obstetrics & Gynecology

## 2017-02-26 ENCOUNTER — Ambulatory Visit (INDEPENDENT_AMBULATORY_CARE_PROVIDER_SITE_OTHER): Payer: Medicaid Other | Admitting: Obstetrics & Gynecology

## 2017-02-26 DIAGNOSIS — Z3400 Encounter for supervision of normal first pregnancy, unspecified trimester: Secondary | ICD-10-CM | POA: Diagnosis present

## 2017-02-26 DIAGNOSIS — Z3403 Encounter for supervision of normal first pregnancy, third trimester: Secondary | ICD-10-CM | POA: Insufficient documentation

## 2017-02-26 NOTE — Patient Instructions (Signed)
Vaginal Delivery Vaginal delivery means that you will give birth by pushing your baby out of your birth canal (vagina). A team of health care providers will help you before, during, and after vaginal delivery. Birth experiences are unique for every woman and every pregnancy, and birth experiences vary depending on where you choose to give birth. What should I do to prepare for my baby's birth? Before your baby is born, it is important to talk with your health care provider about:  Your labor and delivery preferences. These may include: ? Medicines that you may be given. ? How you will manage your pain. This might include non-medical pain relief techniques or injectable pain relief such as epidural analgesia. ? How you and your baby will be monitored during labor and delivery. ? Who may be in the labor and delivery room with you. ? Your feelings about surgical delivery of your baby (cesarean delivery, or C-section) if this becomes necessary. ? Your feelings about receiving donated blood through an IV tube (blood transfusion) if this becomes necessary.  Whether you are able: ? To take pictures or videos of the birth. ? To eat during labor and delivery. ? To move around, walk, or change positions during labor and delivery.  What to expect after your baby is born, such as: ? Whether delayed umbilical cord clamping and cutting is offered. ? Who will care for your baby right after birth. ? Medicines or tests that may be recommended for your baby. ? Whether breastfeeding is supported in your hospital or birth center. ? How long you will be in the hospital or birth center.  How any medical conditions you have may affect your baby or your labor and delivery experience.  To prepare for your baby's birth, you should also:  Attend all of your health care visits before delivery (prenatal visits) as recommended by your health care provider. This is important.  Prepare your home for your baby's  arrival. Make sure that you have: ? Diapers. ? Baby clothing. ? Feeding equipment. ? Safe sleeping arrangements for you and your baby.  Install a car seat in your vehicle. Have your car seat checked by a certified car seat installer to make sure that it is installed safely.  Think about who will help you with your new baby at home for at least the first several weeks after delivery.  What can I expect when I arrive at the birth center or hospital? Once you are in labor and have been admitted into the hospital or birth center, your health care provider may:  Review your pregnancy history and any concerns you have.  Insert an IV tube into one of your veins. This is used to give you fluids and medicines.  Check your blood pressure, pulse, temperature, and heart rate (vital signs).  Check whether your bag of water (amniotic sac) has broken (ruptured).  Talk with you about your birth plan and discuss pain control options.  Monitoring Your health care provider may monitor your contractions (uterine monitoring) and your baby's heart rate (fetal monitoring). You may need to be monitored:  Often, but not continuously (intermittently).  All the time or for long periods at a time (continuously). Continuous monitoring may be needed if: ? You are taking certain medicines, such as medicine to relieve pain or make your contractions stronger. ? You have pregnancy or labor complications.  Monitoring may be done by:  Placing a special stethoscope or a handheld monitoring device on your abdomen to   check your baby's heartbeat, and feeling your abdomen for contractions. This method of monitoring does not continuously record your baby's heartbeat or your contractions.  Placing monitors on your abdomen (external monitors) to record your baby's heartbeat and the frequency and length of contractions. You may not have to wear external monitors all the time.  Placing monitors inside of your uterus  (internal monitors) to record your baby's heartbeat and the frequency, length, and strength of your contractions. ? Your health care provider may use internal monitors if he or she needs more information about the strength of your contractions or your baby's heart rate. ? Internal monitors are put in place by passing a thin, flexible wire through your vagina and into your uterus. Depending on the type of monitor, it may remain in your uterus or on your baby's head until birth. ? Your health care provider will discuss the benefits and risks of internal monitoring with you and will ask for your permission before inserting the monitors.  Telemetry. This is a type of continuous monitoring that can be done with external or internal monitors. Instead of having to stay in bed, you are able to move around during telemetry. Ask your health care provider if telemetry is an option for you.  Physical exam Your health care provider may perform a physical exam. This may include:  Checking whether your baby is positioned: ? With the head toward your vagina (head-down). This is most common. ? With the head toward the top of your uterus (head-up or breech). If your baby is in a breech position, your health care provider may try to turn your baby to a head-down position so you can deliver vaginally. If it does not seem that your baby can be born vaginally, your provider may recommend surgery to deliver your baby. In rare cases, you may be able to deliver vaginally if your baby is head-up (breech delivery). ? Lying sideways (transverse). Babies that are lying sideways cannot be delivered vaginally.  Checking your cervix to determine: ? Whether it is thinning out (effacing). ? Whether it is opening up (dilating). ? How low your baby has moved into your birth canal.  What are the three stages of labor and delivery?  Normal labor and delivery is divided into the following three stages: Stage 1  Stage 1 is the  longest stage of labor, and it can last for hours or days. Stage 1 includes: ? Early labor. This is when contractions may be irregular, or regular and mild. Generally, early labor contractions are more than 10 minutes apart. ? Active labor. This is when contractions get longer, more regular, more frequent, and more intense. ? The transition phase. This is when contractions happen very close together, are very intense, and may last longer than during any other part of labor.  Contractions generally feel mild, infrequent, and irregular at first. They get stronger, more frequent (about every 2-3 minutes), and more regular as you progress from early labor through active labor and transition.  Many women progress through stage 1 naturally, but you may need help to continue making progress. If this happens, your health care provider may talk with you about: ? Rupturing your amniotic sac if it has not ruptured yet. ? Giving you medicine to help make your contractions stronger and more frequent.  Stage 1 ends when your cervix is completely dilated to 4 inches (10 cm) and completely effaced. This happens at the end of the transition phase. Stage 2  Once   your cervix is completely effaced and dilated to 4 inches (10 cm), you may start to feel an urge to push. It is common for the body to naturally take a rest before feeling the urge to push, especially if you received an epidural or certain other pain medicines. This rest period may last for up to 1-2 hours, depending on your unique labor experience.  During stage 2, contractions are generally less painful, because pushing helps relieve contraction pain. Instead of contraction pain, you may feel stretching and burning pain, especially when the widest part of your baby's head passes through the vaginal opening (crowning).  Your health care provider will closely monitor your pushing progress and your baby's progress through the vagina during stage 2.  Your  health care provider may massage the area of skin between your vaginal opening and anus (perineum) or apply warm compresses to your perineum. This helps it stretch as the baby's head starts to crown, which can help prevent perineal tearing. ? In some cases, an incision may be made in your perineum (episiotomy) to allow the baby to pass through the vaginal opening. An episiotomy helps to make the opening of the vagina larger to allow more room for the baby to fit through.  It is very important to breathe and focus so your health care provider can control the delivery of your baby's head. Your health care provider may have you decrease the intensity of your pushing, to help prevent perineal tearing.  After delivery of your baby's head, the shoulders and the rest of the body generally deliver very quickly and without difficulty.  Once your baby is delivered, the umbilical cord may be cut right away, or this may be delayed for 1-2 minutes, depending on your baby's health. This may vary among health care providers, hospitals, and birth centers.  If you and your baby are healthy enough, your baby may be placed on your chest or abdomen to help maintain the baby's temperature and to help you bond with each other. Some mothers and babies start breastfeeding at this time. Your health care team will dry your baby and help keep your baby warm during this time.  Your baby may need immediate care if he or she: ? Showed signs of distress during labor. ? Has a medical condition. ? Was born too early (prematurely). ? Had a bowel movement before birth (meconium). ? Shows signs of difficulty transitioning from being inside the uterus to being outside of the uterus. If you are planning to breastfeed, your health care team will help you begin a feeding. Stage 3  The third stage of labor starts immediately after the birth of your baby and ends after you deliver the placenta. The placenta is an organ that develops  during pregnancy to provide oxygen and nutrients to your baby in the womb.  Delivering the placenta may require some pushing, and you may have mild contractions. Breastfeeding can stimulate contractions to help you deliver the placenta.  After the placenta is delivered, your uterus should tighten (contract) and become firm. This helps to stop bleeding in your uterus. To help your uterus contract and to control bleeding, your health care provider may: ? Give you medicine by injection, through an IV tube, by mouth, or through your rectum (rectally). ? Massage your abdomen or perform a vaginal exam to remove any blood clots that are left in your uterus. ? Empty your bladder by placing a thin, flexible tube (catheter) into your bladder. ? Encourage   you to breastfeed your baby. After labor is over, you and your baby will be monitored closely to ensure that you are both healthy until you are ready to go home. Your health care team will teach you how to care for yourself and your baby. This information is not intended to replace advice given to you by your health care provider. Make sure you discuss any questions you have with your health care provider. Document Released: 05/14/2008 Document Revised: 02/23/2016 Document Reviewed: 08/20/2015 Elsevier Interactive Patient Education  2018 [employer]  

## 2017-02-26 NOTE — Progress Notes (Signed)
   PRENATAL VISIT NOTE  Subjective:  Sue Graves is a 21 y.o. G3P0020 at 3032w6d being seen today for ongoing prenatal care.  She is currently monitored for the following issues for this low-risk pregnancy and has Supervision of normal pregnancy, antepartum; Tobacco abuse; and GBS (group B streptococcus) UTI complicating pregnancy on her problem list.  Patient reports no complaints.  Contractions: Not present. Vag. Bleeding: None.  Movement: Present. Denies leaking of fluid.   The following portions of the patient's history were reviewed and updated as appropriate: allergies, current medications, past family history, past medical history, past social history, past surgical history and problem list. Problem list updated.  Objective:   Vitals:   02/26/17 1127  BP: 123/80  Pulse: 88  Weight: 56.3 kg (124 lb 1.6 oz)    Fetal Status: Fetal Heart Rate (bpm): 145   Movement: Present     General:  Alert, oriented and cooperative. Patient is in no acute distress.  Skin: Skin is warm and dry. No rash noted.   Cardiovascular: Normal heart rate noted  Respiratory: Normal respiratory effort, no problems with respiration noted  Abdomen: Soft, gravid, appropriate for gestational age. Pain/Pressure: Present     Pelvic:  Cervical exam deferred        Extremities: Normal range of motion.  Edema: None  Mental Status: Normal mood and affect. Normal behavior. Normal judgment and thought content.   Assessment and Plan:  Pregnancy: G3P0020 at 6232w6d  1. Supervision of normal first pregnancy, antepartum Could not tolerate cervical exam today - Cervicovaginal ancillary only  Preterm labor symptoms and general obstetric precautions including but not limited to vaginal bleeding, contractions, leaking of fluid and fetal movement were reviewed in detail with the patient. Please refer to After Visit Summary for other counseling recommendations.  Return in about 1 week (around 03/05/2017).   Scheryl DarterJames Rosey Eide,  MD

## 2017-02-28 ENCOUNTER — Encounter (HOSPITAL_COMMUNITY): Payer: Self-pay

## 2017-02-28 ENCOUNTER — Inpatient Hospital Stay (HOSPITAL_COMMUNITY)
Admission: AD | Admit: 2017-02-28 | Discharge: 2017-02-28 | Disposition: A | Discharge status: Home / Self Care | Payer: Medicaid Other | Source: Ambulatory Visit | Attending: Obstetrics & Gynecology | Admitting: Obstetrics & Gynecology

## 2017-02-28 DIAGNOSIS — O4703 False labor before 37 completed weeks of gestation, third trimester: Secondary | ICD-10-CM | POA: Diagnosis present

## 2017-02-28 DIAGNOSIS — O479 False labor, unspecified: Secondary | ICD-10-CM

## 2017-02-28 DIAGNOSIS — Z3A36 36 weeks gestation of pregnancy: Secondary | ICD-10-CM | POA: Diagnosis not present

## 2017-02-28 LAB — CERVICOVAGINAL ANCILLARY ONLY
BACTERIAL VAGINITIS: POSITIVE — AB
CANDIDA VAGINITIS: POSITIVE — AB
Chlamydia: NEGATIVE
NEISSERIA GONORRHEA: NEGATIVE
Trichomonas: NEGATIVE

## 2017-02-28 NOTE — MAU Note (Signed)
Patient presents with a lot of pressure for some time, has progressively gotten worse,  Vaginal pain as well, Denies vaginal bleeding, positive FM

## 2017-02-28 NOTE — MAU Note (Signed)
I have communicated with L Leftwich-Kirby CNM and reviewed vital signs:  Vitals:   02/28/17 1426  BP: 127/86  Pulse: 75  Resp: 16  Temp: 98.2 F (36.8 C)    Vaginal exam:  Dilation: Fingertip Effacement (%): Thick Cervical Position: Posterior Exam by:: N Tylek Boney RN,   Also reviewed contraction pattern and that non-stress test is reactive.  It has been documented that patient is not contracting. Patient denies any other complaints.  Based on this report provider has given order for discharge.  A discharge order and diagnosis entered by a provider.   Labor discharge instructions reviewed with patient.

## 2017-02-28 NOTE — Progress Notes (Signed)
Prenatal counseling for impending newborn done-- Z76.81  

## 2017-02-28 NOTE — Discharge Instructions (Signed)

## 2017-03-05 ENCOUNTER — Ambulatory Visit (INDEPENDENT_AMBULATORY_CARE_PROVIDER_SITE_OTHER): Payer: Medicaid Other | Admitting: Obstetrics & Gynecology

## 2017-03-05 VITALS — BP 130/79 | HR 82 | Wt 125.0 lb

## 2017-03-05 DIAGNOSIS — B951 Streptococcus, group B, as the cause of diseases classified elsewhere: Secondary | ICD-10-CM

## 2017-03-05 DIAGNOSIS — Z3400 Encounter for supervision of normal first pregnancy, unspecified trimester: Secondary | ICD-10-CM

## 2017-03-05 DIAGNOSIS — O2343 Unspecified infection of urinary tract in pregnancy, third trimester: Secondary | ICD-10-CM

## 2017-03-05 DIAGNOSIS — Z3483 Encounter for supervision of other normal pregnancy, third trimester: Secondary | ICD-10-CM

## 2017-03-05 MED ORDER — FLUCONAZOLE 150 MG PO TABS: 150.0000 mg | ORAL_TABLET | Freq: Once | ORAL | 0 refills | Status: AC

## 2017-03-05 MED ORDER — METRONIDAZOLE 500 MG PO TABS: 500.0000 mg | ORAL_TABLET | Freq: Two times a day (BID) | ORAL | 0 refills | Status: AC

## 2017-03-05 NOTE — Progress Notes (Signed)
   PRENATAL VISIT NOTE  Subjective:  Sue Graves is a 21 y.o. G3P0020 at 6955w6d being seen today for ongoing prenatal care.  She is currently monitored for the following issues for this low-risk pregnancy and has Supervision of normal pregnancy, antepartum; Tobacco abuse; and GBS (group B streptococcus) UTI complicating pregnancy on her problem list.  Patient reports occasional contractions.  Contractions: Not present. Vag. Bleeding: None.  Movement: Present. Denies leaking of fluid.   The following portions of the patient's history were reviewed and updated as appropriate: allergies, current medications, past family history, past medical history, past social history, past surgical history and problem list. Problem list updated.  Objective:   Vitals:   03/05/17 1013  BP: 130/79  Pulse: 82  Weight: 125 lb (56.7 kg)    Fetal Status: Fetal Heart Rate (bpm): 145   Movement: Present     General:  Alert, oriented and cooperative. Patient is in no acute distress.  Skin: Skin is warm and dry. No rash noted.   Cardiovascular: Normal heart rate noted  Respiratory: Normal respiratory effort, no problems with respiration noted  Abdomen: Soft, gravid, appropriate for gestational age.  Pain/Pressure: Present     Pelvic: Cervical exam deferred        Extremities: Normal range of motion.  Edema: Trace  Mental Status:  Normal mood and affect. Normal behavior. Normal judgment and thought content.   Assessment and Plan:  Pregnancy: G3P0020 at 755w6d  1. Supervision of normal first pregnancy, antepartum Continue current care  2. Group B Streptococcus urinary tract infection affecting pregnancy in third trimester cx was checked in MAU 7/13  Term labor symptoms and general obstetric precautions including but not limited to vaginal bleeding, contractions, leaking of fluid and fetal movement were reviewed in detail with the patient. Please refer to After Visit Summary for other counseling  recommendations.  Return in about 1 week (around 03/12/2017).   Scheryl DarterJames Liara Holm, MD

## 2017-03-05 NOTE — Patient Instructions (Signed)
Vaginal Delivery Vaginal delivery means that you will give birth by pushing your baby out of your birth canal (vagina). A team of health care providers will help you before, during, and after vaginal delivery. Birth experiences are unique for every woman and every pregnancy, and birth experiences vary depending on where you choose to give birth. What should I do to prepare for my baby's birth? Before your baby is born, it is important to talk with your health care provider about:  Your labor and delivery preferences. These may include: ? Medicines that you may be given. ? How you will manage your pain. This might include non-medical pain relief techniques or injectable pain relief such as epidural analgesia. ? How you and your baby will be monitored during labor and delivery. ? Who may be in the labor and delivery room with you. ? Your feelings about surgical delivery of your baby (cesarean delivery, or C-section) if this becomes necessary. ? Your feelings about receiving donated blood through an IV tube (blood transfusion) if this becomes necessary.  Whether you are able: ? To take pictures or videos of the birth. ? To eat during labor and delivery. ? To move around, walk, or change positions during labor and delivery.  What to expect after your baby is born, such as: ? Whether delayed umbilical cord clamping and cutting is offered. ? Who will care for your baby right after birth. ? Medicines or tests that may be recommended for your baby. ? Whether breastfeeding is supported in your hospital or birth center. ? How long you will be in the hospital or birth center.  How any medical conditions you have may affect your baby or your labor and delivery experience.  To prepare for your baby's birth, you should also:  Attend all of your health care visits before delivery (prenatal visits) as recommended by your health care provider. This is important.  Prepare your home for your baby's  arrival. Make sure that you have: ? Diapers. ? Baby clothing. ? Feeding equipment. ? Safe sleeping arrangements for you and your baby.  Install a car seat in your vehicle. Have your car seat checked by a certified car seat installer to make sure that it is installed safely.  Think about who will help you with your new baby at home for at least the first several weeks after delivery.  What can I expect when I arrive at the birth center or hospital? Once you are in labor and have been admitted into the hospital or birth center, your health care provider may:  Review your pregnancy history and any concerns you have.  Insert an IV tube into one of your veins. This is used to give you fluids and medicines.  Check your blood pressure, pulse, temperature, and heart rate (vital signs).  Check whether your bag of water (amniotic sac) has broken (ruptured).  Talk with you about your birth plan and discuss pain control options.  Monitoring Your health care provider may monitor your contractions (uterine monitoring) and your baby's heart rate (fetal monitoring). You may need to be monitored:  Often, but not continuously (intermittently).  All the time or for long periods at a time (continuously). Continuous monitoring may be needed if: ? You are taking certain medicines, such as medicine to relieve pain or make your contractions stronger. ? You have pregnancy or labor complications.  Monitoring may be done by:  Placing a special stethoscope or a handheld monitoring device on your abdomen to   check your baby's heartbeat, and feeling your abdomen for contractions. This method of monitoring does not continuously record your baby's heartbeat or your contractions.  Placing monitors on your abdomen (external monitors) to record your baby's heartbeat and the frequency and length of contractions. You may not have to wear external monitors all the time.  Placing monitors inside of your uterus  (internal monitors) to record your baby's heartbeat and the frequency, length, and strength of your contractions. ? Your health care provider may use internal monitors if he or she needs more information about the strength of your contractions or your baby's heart rate. ? Internal monitors are put in place by passing a thin, flexible wire through your vagina and into your uterus. Depending on the type of monitor, it may remain in your uterus or on your baby's head until birth. ? Your health care provider will discuss the benefits and risks of internal monitoring with you and will ask for your permission before inserting the monitors.  Telemetry. This is a type of continuous monitoring that can be done with external or internal monitors. Instead of having to stay in bed, you are able to move around during telemetry. Ask your health care provider if telemetry is an option for you.  Physical exam Your health care provider may perform a physical exam. This may include:  Checking whether your baby is positioned: ? With the head toward your vagina (head-down). This is most common. ? With the head toward the top of your uterus (head-up or breech). If your baby is in a breech position, your health care provider may try to turn your baby to a head-down position so you can deliver vaginally. If it does not seem that your baby can be born vaginally, your provider may recommend surgery to deliver your baby. In rare cases, you may be able to deliver vaginally if your baby is head-up (breech delivery). ? Lying sideways (transverse). Babies that are lying sideways cannot be delivered vaginally.  Checking your cervix to determine: ? Whether it is thinning out (effacing). ? Whether it is opening up (dilating). ? How low your baby has moved into your birth canal.  What are the three stages of labor and delivery?  Normal labor and delivery is divided into the following three stages: Stage 1  Stage 1 is the  longest stage of labor, and it can last for hours or days. Stage 1 includes: ? Early labor. This is when contractions may be irregular, or regular and mild. Generally, early labor contractions are more than 10 minutes apart. ? Active labor. This is when contractions get longer, more regular, more frequent, and more intense. ? The transition phase. This is when contractions happen very close together, are very intense, and may last longer than during any other part of labor.  Contractions generally feel mild, infrequent, and irregular at first. They get stronger, more frequent (about every 2-3 minutes), and more regular as you progress from early labor through active labor and transition.  Many women progress through stage 1 naturally, but you may need help to continue making progress. If this happens, your health care provider may talk with you about: ? Rupturing your amniotic sac if it has not ruptured yet. ? Giving you medicine to help make your contractions stronger and more frequent.  Stage 1 ends when your cervix is completely dilated to 4 inches (10 cm) and completely effaced. This happens at the end of the transition phase. Stage 2  Once   your cervix is completely effaced and dilated to 4 inches (10 cm), you may start to feel an urge to push. It is common for the body to naturally take a rest before feeling the urge to push, especially if you received an epidural or certain other pain medicines. This rest period may last for up to 1-2 hours, depending on your unique labor experience.  During stage 2, contractions are generally less painful, because pushing helps relieve contraction pain. Instead of contraction pain, you may feel stretching and burning pain, especially when the widest part of your baby's head passes through the vaginal opening (crowning).  Your health care provider will closely monitor your pushing progress and your baby's progress through the vagina during stage 2.  Your  health care provider may massage the area of skin between your vaginal opening and anus (perineum) or apply warm compresses to your perineum. This helps it stretch as the baby's head starts to crown, which can help prevent perineal tearing. ? In some cases, an incision may be made in your perineum (episiotomy) to allow the baby to pass through the vaginal opening. An episiotomy helps to make the opening of the vagina larger to allow more room for the baby to fit through.  It is very important to breathe and focus so your health care provider can control the delivery of your baby's head. Your health care provider may have you decrease the intensity of your pushing, to help prevent perineal tearing.  After delivery of your baby's head, the shoulders and the rest of the body generally deliver very quickly and without difficulty.  Once your baby is delivered, the umbilical cord may be cut right away, or this may be delayed for 1-2 minutes, depending on your baby's health. This may vary among health care providers, hospitals, and birth centers.  If you and your baby are healthy enough, your baby may be placed on your chest or abdomen to help maintain the baby's temperature and to help you bond with each other. Some mothers and babies start breastfeeding at this time. Your health care team will dry your baby and help keep your baby warm during this time.  Your baby may need immediate care if he or she: ? Showed signs of distress during labor. ? Has a medical condition. ? Was born too early (prematurely). ? Had a bowel movement before birth (meconium). ? Shows signs of difficulty transitioning from being inside the uterus to being outside of the uterus. If you are planning to breastfeed, your health care team will help you begin a feeding. Stage 3  The third stage of labor starts immediately after the birth of your baby and ends after you deliver the placenta. The placenta is an organ that develops  during pregnancy to provide oxygen and nutrients to your baby in the womb.  Delivering the placenta may require some pushing, and you may have mild contractions. Breastfeeding can stimulate contractions to help you deliver the placenta.  After the placenta is delivered, your uterus should tighten (contract) and become firm. This helps to stop bleeding in your uterus. To help your uterus contract and to control bleeding, your health care provider may: ? Give you medicine by injection, through an IV tube, by mouth, or through your rectum (rectally). ? Massage your abdomen or perform a vaginal exam to remove any blood clots that are left in your uterus. ? Empty your bladder by placing a thin, flexible tube (catheter) into your bladder. ? Encourage   you to breastfeed your baby. After labor is over, you and your baby will be monitored closely to ensure that you are both healthy until you are ready to go home. Your health care team will teach you how to care for yourself and your baby. This information is not intended to replace advice given to you by your health care provider. Make sure you discuss any questions you have with your health care provider. Document Released: 05/14/2008 Document Revised: 02/23/2016 Document Reviewed: 08/20/2015 Elsevier Interactive Patient Education  2018 [employer]  

## 2017-03-14 ENCOUNTER — Ambulatory Visit (INDEPENDENT_AMBULATORY_CARE_PROVIDER_SITE_OTHER): Payer: Medicaid Other | Admitting: Certified Nurse Midwife

## 2017-03-14 VITALS — BP 135/86 | HR 87 | Wt 124.5 lb

## 2017-03-14 DIAGNOSIS — O2343 Unspecified infection of urinary tract in pregnancy, third trimester: Secondary | ICD-10-CM

## 2017-03-14 DIAGNOSIS — Z3480 Encounter for supervision of other normal pregnancy, unspecified trimester: Secondary | ICD-10-CM

## 2017-03-14 DIAGNOSIS — Z3400 Encounter for supervision of normal first pregnancy, unspecified trimester: Secondary | ICD-10-CM

## 2017-03-14 DIAGNOSIS — Z3483 Encounter for supervision of other normal pregnancy, third trimester: Secondary | ICD-10-CM

## 2017-03-14 DIAGNOSIS — B951 Streptococcus, group B, as the cause of diseases classified elsewhere: Secondary | ICD-10-CM

## 2017-03-14 NOTE — Progress Notes (Signed)
   PRENATAL VISIT NOTE  Subjective:  Sue Graves is a 21 y.o. G3P0020 at 3737w1d being seen today for ongoing prenatal care.  She is currently monitored for the following issues for this low-risk pregnancy and has Supervision of normal pregnancy, antepartum; Tobacco abuse; and GBS (group B streptococcus) UTI complicating pregnancy on her problem list.  Patient reports no complaints.  Contractions: Not present. Vag. Bleeding: None.  Movement: Present. Denies leaking of fluid.   The following portions of the patient's history were reviewed and updated as appropriate: allergies, current medications, past family history, past medical history, past social history, past surgical history and problem list. Problem list updated.  Objective:   Vitals:   03/14/17 1019 03/14/17 1021  BP: (!) 135/93 135/86  Pulse: 87   Weight: 124 lb 8 oz (56.5 kg)     Fetal Status: Fetal Heart Rate (bpm): 131 Fundal Height: 35 cm Movement: Present     General:  Alert, oriented and cooperative. Patient is in no acute distress.  Skin: Skin is warm and dry. No rash noted.   Cardiovascular: Normal heart rate noted  Respiratory: Normal respiratory effort, no problems with respiration noted  Abdomen: Soft, gravid, appropriate for gestational age.  Pain/Pressure: Absent     Pelvic: Cervical exam deferred        Extremities: Normal range of motion.  Edema: None  Mental Status:  Normal mood and affect. Normal behavior. Normal judgment and thought content.   Assessment and Plan:  Pregnancy: G3P0020 at 7237w1d  1. Encounter for supervision of other normal pregnancy in third trimester     Doing well.   2. Group B Streptococcus urinary tract infection affecting pregnancy in third trimester     PCN for labor/delivery       3. Supervision of other normal pregnancy, antepartum        Term labor symptoms and general obstetric precautions including but not limited to vaginal bleeding, contractions, leaking of fluid and  fetal movement were reviewed in detail with the patient. Please refer to After Visit Summary for other counseling recommendations.  Return in about 1 week (around 03/21/2017) for ROB.   Roe Coombsachelle A Rondall Radigan, CNM

## 2017-03-14 NOTE — Progress Notes (Signed)
Pt requests liquid abx for BV. She has difficulty swallowing pills.

## 2017-03-18 ENCOUNTER — Encounter (HOSPITAL_COMMUNITY): Payer: Self-pay

## 2017-03-18 ENCOUNTER — Inpatient Hospital Stay (HOSPITAL_COMMUNITY)
Admission: AD | Admit: 2017-03-18 | Discharge: 2017-03-21 | DRG: 774 | Disposition: A | Discharge status: Home / Self Care | Payer: Medicaid Other | Source: Ambulatory Visit | Attending: Family Medicine | Admitting: Family Medicine

## 2017-03-18 DIAGNOSIS — O134 Gestational [pregnancy-induced] hypertension without significant proteinuria, complicating childbirth: Secondary | ICD-10-CM | POA: Diagnosis not present

## 2017-03-18 DIAGNOSIS — Z3A38 38 weeks gestation of pregnancy: Secondary | ICD-10-CM

## 2017-03-18 DIAGNOSIS — O99824 Streptococcus B carrier state complicating childbirth: Secondary | ICD-10-CM | POA: Diagnosis present

## 2017-03-18 DIAGNOSIS — O2343 Unspecified infection of urinary tract in pregnancy, third trimester: Secondary | ICD-10-CM

## 2017-03-18 DIAGNOSIS — B951 Streptococcus, group B, as the cause of diseases classified elsewhere: Secondary | ICD-10-CM

## 2017-03-18 DIAGNOSIS — O133 Gestational [pregnancy-induced] hypertension without significant proteinuria, third trimester: Secondary | ICD-10-CM

## 2017-03-18 DIAGNOSIS — Z3480 Encounter for supervision of other normal pregnancy, unspecified trimester: Secondary | ICD-10-CM

## 2017-03-18 DIAGNOSIS — Z87891 Personal history of nicotine dependence: Secondary | ICD-10-CM

## 2017-03-18 DIAGNOSIS — Z720 Tobacco use: Secondary | ICD-10-CM

## 2017-03-18 LAB — CBC
HEMATOCRIT: 34.1 % — AB (ref 36.0–46.0)
Hemoglobin: 11.9 g/dL — ABNORMAL LOW (ref 12.0–15.0)
MCH: 32.6 pg (ref 26.0–34.0)
MCHC: 34.9 g/dL (ref 30.0–36.0)
MCV: 93.4 fL (ref 78.0–100.0)
Platelets: 226 10*3/uL (ref 150–400)
RBC: 3.65 MIL/uL — AB (ref 3.87–5.11)
RDW: 14 % (ref 11.5–15.5)
WBC: 7.7 10*3/uL (ref 4.0–10.5)

## 2017-03-18 LAB — COMPREHENSIVE METABOLIC PANEL
ALK PHOS: 160 U/L — AB (ref 38–126)
ALT: 17 U/L (ref 14–54)
AST: 23 U/L (ref 15–41)
Albumin: 3.3 g/dL — ABNORMAL LOW (ref 3.5–5.0)
Anion gap: 8 (ref 5–15)
BILIRUBIN TOTAL: 0.4 mg/dL (ref 0.3–1.2)
BUN: 7 mg/dL (ref 6–20)
CALCIUM: 9.1 mg/dL (ref 8.9–10.3)
CO2: 22 mmol/L (ref 22–32)
Chloride: 107 mmol/L (ref 101–111)
Creatinine, Ser: 0.76 mg/dL (ref 0.44–1.00)
Glucose, Bld: 103 mg/dL — ABNORMAL HIGH (ref 65–99)
Potassium: 3.2 mmol/L — ABNORMAL LOW (ref 3.5–5.1)
Sodium: 137 mmol/L (ref 135–145)
TOTAL PROTEIN: 6.9 g/dL (ref 6.5–8.1)

## 2017-03-18 LAB — PROTEIN / CREATININE RATIO, URINE
CREATININE, URINE: 59 mg/dL
Protein Creatinine Ratio: 0.17 mg/mg{Cre} — ABNORMAL HIGH (ref 0.00–0.15)
Total Protein, Urine: 10 mg/dL

## 2017-03-18 MED ORDER — OXYTOCIN 40 UNITS IN LACTATED RINGERS INFUSION - SIMPLE MED
2.5000 [IU]/h | INTRAVENOUS | Status: DC
  Administered 2017-03-19: 2.5 [IU]/h via INTRAVENOUS

## 2017-03-18 MED ORDER — PENICILLIN G POTASSIUM 5000000 UNITS IJ SOLR
5.0000 10*6.[IU] | Freq: Once | INTRAVENOUS | Status: AC
  Administered 2017-03-19: 5 10*6.[IU] via INTRAVENOUS
  Filled 2017-03-18: qty 5

## 2017-03-18 MED ORDER — LACTATED RINGERS IV SOLN
500.0000 mL | INTRAVENOUS | Status: DC | PRN
  Administered 2017-03-19: 500 mL via INTRAVENOUS

## 2017-03-18 MED ORDER — FLEET ENEMA 7-19 GM/118ML RE ENEM
1.0000 | ENEMA | Freq: Once | RECTAL | Status: DC
Start: 2017-03-18 — End: 2017-03-20

## 2017-03-18 MED ORDER — HYDRALAZINE HCL 20 MG/ML IJ SOLN: 10.0000 mg | Freq: Once | INTRAMUSCULAR | Status: DC | PRN

## 2017-03-18 MED ORDER — OXYTOCIN BOLUS FROM INFUSION
500.0000 mL | Freq: Once | INTRAVENOUS | Status: AC
  Administered 2017-03-19 – 2017-03-20 (×2): 500 mL via INTRAVENOUS

## 2017-03-18 MED ORDER — LIDOCAINE HCL (PF) 1 % IJ SOLN
30.0000 mL | INTRAMUSCULAR | Status: DC | PRN
  Administered 2017-03-19: 30 mL via SUBCUTANEOUS
  Filled 2017-03-18: qty 30

## 2017-03-18 MED ORDER — OXYCODONE-ACETAMINOPHEN 5-325 MG PO TABS: 2.0000 | ORAL_TABLET | ORAL | Status: DC | PRN

## 2017-03-18 MED ORDER — SOD CITRATE-CITRIC ACID 500-334 MG/5ML PO SOLN
30.0000 mL | ORAL | Status: DC | PRN
Start: 2017-03-18 — End: 2017-03-20
  Administered 2017-03-19: 30 mL via ORAL
  Filled 2017-03-18: qty 15

## 2017-03-18 MED ORDER — OXYCODONE-ACETAMINOPHEN 5-325 MG PO TABS: 1.0000 | ORAL_TABLET | ORAL | Status: DC | PRN

## 2017-03-18 MED ORDER — LACTATED RINGERS IV SOLN
INTRAVENOUS | Status: DC
  Administered 2017-03-18: 21:00:00 via INTRAVENOUS

## 2017-03-18 MED ORDER — LABETALOL HCL 5 MG/ML IV SOLN: 20.0000 mg | INTRAVENOUS | Status: DC | PRN

## 2017-03-18 MED ORDER — FAMOTIDINE 20 MG PO TABS
40.0000 mg | ORAL_TABLET | Freq: Once | ORAL | Status: AC
  Administered 2017-03-18: 40 mg via ORAL
  Filled 2017-03-18: qty 2

## 2017-03-18 MED ORDER — ACETAMINOPHEN 325 MG PO TABS: 650.0000 mg | ORAL_TABLET | ORAL | Status: DC | PRN

## 2017-03-18 MED ORDER — ONDANSETRON HCL 4 MG/2ML IJ SOLN: 4.0000 mg | Freq: Four times a day (QID) | INTRAMUSCULAR | Status: DC | PRN

## 2017-03-18 MED ORDER — PENICILLIN G POT IN DEXTROSE 60000 UNIT/ML IV SOLN
3.0000 10*6.[IU] | INTRAVENOUS | Status: DC
  Administered 2017-03-19 (×2): 3 10*6.[IU] via INTRAVENOUS
  Filled 2017-03-18 (×10): qty 50

## 2017-03-18 NOTE — MAU Note (Signed)
Pt presents via EMS for contractions.  States she has been having elevated Bps for past 2 doctors appointments but hasn't had any treatment.  States her ctx have been going on for past 3 days.  Cervix hasn't been checked since 2 weeks ago-closed on last check.  Reports good fetal movement, no VB or LOF.

## 2017-03-18 NOTE — MAU Provider Note (Signed)
History     CSN: [id number]  Arrival date and time: 03/18/17 9798   First Provider Initiated Contact with Patient 03/18/17 2031      Chief Complaint  Patient presents with  . Contractions   HPI    [name] is a 21 y.o. female G3P0020 @ 8w5dhere in MAU with contractions. Patient states her contractions have been off and on for the past 3 days however have gotten more painful today. States her BP has been elevated here and there at her OB appointments. + fetal movements, denies vaginal bleeding.   OB History    Gravida Para Term Preterm AB Living   3       2     SAB TAB Ectopic Multiple Live Births   2              Past Medical History:  Diagnosis Date  . Asthma    as a child , no inhaler  . Depression    history - no meds  . Missed abortion    no surgery required    Past Surgical History:  Procedure Laterality Date  . DILATION AND EVACUATION N/A 05/01/2016   Procedure: DILATATION AND EVACUATION for Retained Product;  Surgeon: UOsborne Oman MD;  [facility];  Service: Gynecology;  Laterality: N/A;  . WISDOM TOOTH EXTRACTION      Family History  Problem Relation Age of Onset  . Diabetes Maternal Aunt     Social History  Substance Use Topics  . Smoking status: Former Smoker    Packs/day: 0.25    Years: 3.00    Types: Cigarettes    Quit date: 12/17/2016  . Smokeless tobacco: Never Used  . Alcohol use No     Comment: occasionally     Allergies:  Allergies  Allergen Reactions  . Other Itching    Scented soaps/body wash  . Tomato Hives and Swelling    Prescriptions Prior to Admission  Medication Sig Dispense Refill Last Dose  . ondansetron (ZOFRAN ODT) 4 MG disintegrating tablet Take 1 tablet (4 mg total) by mouth every 6 (six) hours as needed for nausea. (Patient not taking: Reported on 03/05/2017) 20 tablet 0 Not Taking  . ranitidine (ZANTAC) 150 MG/10ML syrup Take 10 mLs (150 mg total) by mouth 2 (two) times daily. (Patient not taking:  Reported on 03/14/2017) 300 mL 0 Not Taking   Results for orders placed or performed during the hospital encounter of 03/18/17 (from the past 48 hour(s))  CBC     Status: Abnormal   Collection Time: 03/18/17  8:28 PM  Result Value Ref Range   WBC 7.7 4.0 - 10.5 K/uL   RBC 3.65 (L) 3.87 - 5.11 MIL/uL   Hemoglobin 11.9 (L) 12.0 - 15.0 g/dL   HCT 34.1 (L) 36.0 - 46.0 %   MCV 93.4 78.0 - 100.0 fL   MCH 32.6 26.0 - 34.0 pg   MCHC 34.9 30.0 - 36.0 g/dL   RDW 14.0 11.5 - 15.5 %   Platelets 226 150 - 400 K/uL  Comprehensive metabolic panel     Status: Abnormal   Collection Time: 03/18/17  8:28 PM  Result Value Ref Range   Sodium 137 135 - 145 mmol/L   Potassium 3.2 (L) 3.5 - 5.1 mmol/L   Chloride 107 101 - 111 mmol/L   CO2 22 22 - 32 mmol/L   Glucose, Bld 103 (H) 65 - 99 mg/dL   BUN 7 6 - 20 mg/dL  Creatinine, Ser 0.76 0.44 - 1.00 mg/dL   Calcium 9.1 8.9 - 10.3 mg/dL   Total Protein 6.9 6.5 - 8.1 g/dL   Albumin 3.3 (L) 3.5 - 5.0 g/dL   AST 23 15 - 41 U/L   ALT 17 14 - 54 U/L   Alkaline Phosphatase 160 (H) 38 - 126 U/L   Total Bilirubin 0.4 0.3 - 1.2 mg/dL   GFR calc non Af Amer >60 >60 mL/min   GFR calc Af Amer >60 >60 mL/min    Comment: (NOTE) The eGFR has been calculated using the CKD EPI equation. This calculation has not been validated in all clinical situations. eGFR's persistently <60 mL/min signify possible Chronic Kidney Disease.    Anion gap 8 5 - 15    Review of Systems  Eyes: Negative for photophobia and visual disturbance.  Respiratory: Negative for shortness of breath.   Cardiovascular: Negative for chest pain.  Neurological: Negative for headaches.   Physical Exam   Blood pressure (!) 156/100, pulse 95, temperature 98.6 F (37 C), temperature source Oral, resp. rate (!) 21, last menstrual period 06/16/2016.  Patient Vitals for the past 24 hrs:  BP Temp Temp src Pulse Resp  03/18/17 2113 (!) 154/107 - - (!) 105 -  03/18/17 2046 (!) 156/100 - - 95 -   03/18/17 2015 (!) 155/89 - - 91 -  03/18/17 2001 (!) 145/90 - - 88 -  03/18/17 1952 133/80 98.6 F (37 C) Oral 100 (!) 21   Physical Exam  Constitutional: She is oriented to person, place, and time. She appears well-developed and well-nourished. No distress.  HENT:  Head: Normocephalic.  Respiratory: Effort normal and breath sounds normal. No respiratory distress.  GI: Soft. She exhibits no distension. There is no tenderness. There is no rebound and no guarding.  Genitourinary:  Genitourinary Comments: Dilation: 1 Effacement (%): Thick Cervical Position: Posterior Station: -3 Presentation: Vertex (Confirmed by bedside U/S) Exam by:: Rickey Primus, RN  Musculoskeletal: Normal range of motion.  Neurological: She is alert and oriented to person, place, and time.  Reflex Scores:      Patellar reflexes are 3+ on the right side and 3+ on the left side. Negative clonus   Skin: Skin is warm. She is not diaphoretic.  Psychiatric: Her behavior is normal.    MAU Course  Procedures  None  MDM Category 1 fetal tracing 125 bpm baseline, +accels, no decels. Irregular contraction patter.  [place] labs pending  Discussed admission with Dr.Stinson.  Assessment and Plan   A:  Pregnancy-induced hypertension in third trimester   P:  -Admit to BS + GBS; PCN ordered  Labetalol protocol in place   Noni Saupe I, NP 03/18/2017 .nlow

## 2017-03-18 NOTE — H&P (Signed)
LABOR ADMISSION HISTORY AND PHYSICAL  Sue Graves is a 21 y.o. female G3P0020 with IUP at 3723w5d by US presenting for IOL for GHTN. She reports +FMs, No LOF, no VB, no blurry vision, no headaches, no peripheral edema, and no RUQ pain.  She plans on bottle feeding. She requests OCP's for birth control.  Dating: By [place] --->  Estimated Date of Delivery: 03/27/17  Sono:   @[redacted]w[redacted]d , CWD, normal anatomy, cephalic presentation, 332g, 51% EFW  Prenatal History/Complications:  Past Medical History: Past Medical History:  Diagnosis Date  . Asthma    as a child , no inhaler  . Depression    history - no meds  . Missed abortion    no surgery required    Past Surgical History: Past Surgical History:  Procedure Laterality Date  . DILATION AND EVACUATION N/A 05/01/2016   Procedure: DILATATION AND EVACUATION for Retained Product;  Surgeon: Tereso NewcomerUgonna A Anyanwu, MD;  Location: [facility];  Service: Gynecology;  Laterality: N/A;  . WISDOM TOOTH EXTRACTION      Obstetrical History: OB History    Gravida Para Term Preterm AB Living   3       2     SAB TAB Ectopic Multiple Live Births   2              Social History: Social History   Social History  . Marital status: Single    Spouse name: N/A  . Number of children: N/A  . Years of education: N/A   Social History Main Topics  . Smoking status: Former Smoker    Packs/day: 0.25    Years: 3.00    Types: Cigarettes    Quit date: 12/17/2016  . Smokeless tobacco: Never Used  . Alcohol use No     Comment: occasionally   . Drug use: No     Comment: last use 04/24/16  . Sexual activity: Yes    Birth control/ protection: None     Comment: approx [redacted] wks gestation   Other Topics Concern  . None   Social History Narrative  . None    Family History: Family History  Problem Relation Age of Onset  . Diabetes Maternal Aunt     Allergies: Allergies  Allergen Reactions  . Other Itching    Scented soaps/body wash  . Tomato Hives and Swelling     Prescriptions Prior to Admission  Medication Sig Dispense Refill Last Dose  . ondansetron (ZOFRAN ODT) 4 MG disintegrating tablet Take 1 tablet (4 mg total) by mouth every 6 (six) hours as needed for nausea. (Patient not taking: Reported on 03/05/2017) 20 tablet 0 Not Taking  . ranitidine (ZANTAC) 150 MG/10ML syrup Take 10 mLs (150 mg total) by mouth 2 (two) times daily. (Patient not taking: Reported on 03/14/2017) 300 mL 0 Not Taking     Review of Systems   All systems reviewed and negative except as stated in HPI  Blood pressure (!) 149/109, pulse 92, temperature 98.6 F (37 C), temperature source Oral, resp. rate (!) 21, last menstrual period 06/16/2016. General appearance: alert, cooperative and no distress Lungs: normal work of breathing Extremities: Homans sign is negative, no sign of DVT Presentation: cephalic Fetal monitoringBaseline: 130 bpm, Variability: Good {> 6 bpm), Accelerations: Reactive and Decelerations: Absent Uterine activity: Irregular Dilation: 1 Effacement (%): Thick Station: -3 Exam by:: Katheren ShamsAmber Stovall, RN   Prenatal labs: ABO, Rh: B/Positive/-- (01/31 1648) Antibody: Negative (01/31 1648) Rubella: Immune RPR: Non Reactive (05/08 1040)  HBsAg: Negative (01/31 1648)  HIV:   Non-reactive GBS:   positive GTT: Fasting- 69, 1 hr- 106, 2 hr- 75  Prenatal Transfer Tool  Maternal Diabetes: No Genetic Screening: Normal Maternal Ultrasounds/Referrals: Normal Fetal Ultrasounds or other Referrals:  None Maternal Substance Abuse:  No Significant Maternal Medications:  None Significant Maternal Lab Results: Lab values include: Group B Strep positive  Results for orders placed or performed during the hospital encounter of 03/18/17 (from the past 24 hour(s))  CBC   Collection Time: 03/18/17  8:28 PM  Result Value Ref Range   WBC 7.7 4.0 - 10.5 K/uL   RBC 3.65 (L) 3.87 - 5.11 MIL/uL   Hemoglobin 11.9 (L) 12.0 - 15.0 g/dL   HCT 16.134.1 (L) 09.636.0 - 04.546.0 %   MCV  93.4 78.0 - 100.0 fL   MCH 32.6 26.0 - 34.0 pg   MCHC 34.9 30.0 - 36.0 g/dL   RDW 40.914.0 81.111.5 - 91.415.5 %   Platelets 226 150 - 400 K/uL  Comprehensive metabolic panel   Collection Time: 03/18/17  8:28 PM  Result Value Ref Range   Sodium 137 135 - 145 mmol/L   Potassium 3.2 (L) 3.5 - 5.1 mmol/L   Chloride 107 101 - 111 mmol/L   CO2 22 22 - 32 mmol/L   Glucose, Bld 103 (H) 65 - 99 mg/dL   BUN 7 6 - 20 mg/dL   Creatinine, Ser 7.820.76 0.44 - 1.00 mg/dL   Calcium 9.1 8.9 - 95.610.3 mg/dL   Total Protein 6.9 6.5 - 8.1 g/dL   Albumin 3.3 (L) 3.5 - 5.0 g/dL   AST 23 15 - 41 U/L   ALT 17 14 - 54 U/L   Alkaline Phosphatase 160 (H) 38 - 126 U/L   Total Bilirubin 0.4 0.3 - 1.2 mg/dL   GFR calc non Af Amer >60 >60 mL/min   GFR calc Af Amer >60 >60 mL/min   Anion gap 8 5 - 15    Patient Active Problem List   Diagnosis Date Noted  . GBS (group B streptococcus) UTI complicating pregnancy 09/23/2016  . Supervision of normal pregnancy, antepartum 09/18/2016  . Tobacco abuse 09/18/2016    Assessment: Sue Graves is a 21 y.o. G3P0020 at 1850w5d here for IOL for GHTN  #Labor: Induction protocol- start with cytotec and foley bulb placement #Pain: Epidural upon request  #FWB: Cat 1 #ID: GBS positive- PCN when active labor #MOF: bottle #MOC: OCP's #Circ: Undecided #GHTN: Monitor BP's- PCR .17  [place]iland[place] Shirley, DO Family Medicine Resident PGY-1  03/18/2017, 9:47 PM   I spoke with and examined patient and agree with resident/PA/SNM's note and plan of care.  Initially came to [facility] for r/o labor, was found to have elevated bp's. Pre-e labs normal. Denies ha, visual changes, ruq/epigastric pain, n/v.   Plan cervical foley Monitor bp's Cheral MarkerKimberly R. Madia Carvell, CNM, WHNP-BC 03/19/2017 1:17 AM

## 2017-03-19 ENCOUNTER — Inpatient Hospital Stay (HOSPITAL_COMMUNITY): Payer: Medicaid Other | Admitting: Anesthesiology

## 2017-03-19 ENCOUNTER — Encounter: Payer: Medicaid Other | Admitting: Obstetrics & Gynecology

## 2017-03-19 DIAGNOSIS — O134 Gestational [pregnancy-induced] hypertension without significant proteinuria, complicating childbirth: Secondary | ICD-10-CM

## 2017-03-19 DIAGNOSIS — Z3A38 38 weeks gestation of pregnancy: Secondary | ICD-10-CM

## 2017-03-19 DIAGNOSIS — O99824 Streptococcus B carrier state complicating childbirth: Secondary | ICD-10-CM

## 2017-03-19 LAB — RPR: RPR Ser Ql: NONREACTIVE

## 2017-03-19 LAB — CBC
HCT: 35.9 % — ABNORMAL LOW (ref 36.0–46.0)
HEMOGLOBIN: 12.1 g/dL (ref 12.0–15.0)
MCH: 32.2 pg (ref 26.0–34.0)
MCHC: 33.7 g/dL (ref 30.0–36.0)
MCV: 95.5 fL (ref 78.0–100.0)
Platelets: 168 10*3/uL (ref 150–400)
RBC: 3.76 MIL/uL — AB (ref 3.87–5.11)
RDW: 14 % (ref 11.5–15.5)
WBC: 9.5 10*3/uL (ref 4.0–10.5)

## 2017-03-19 LAB — HIV ANTIBODY (ROUTINE TESTING W REFLEX): HIV Screen 4th Generation wRfx: NONREACTIVE

## 2017-03-19 MED ORDER — PHENYLEPHRINE 40 MCG/ML (10ML) SYRINGE FOR IV PUSH (FOR BLOOD PRESSURE SUPPORT)
80.0000 ug | PREFILLED_SYRINGE | INTRAVENOUS | Status: DC | PRN
  Filled 2017-03-19: qty 5
  Filled 2017-03-19: qty 10

## 2017-03-19 MED ORDER — DIPHENHYDRAMINE HCL 50 MG/ML IJ SOLN: 12.5000 mg | INTRAMUSCULAR | Status: DC | PRN

## 2017-03-19 MED ORDER — EPHEDRINE 5 MG/ML INJ
10.0000 mg | INTRAVENOUS | Status: DC | PRN
  Filled 2017-03-19: qty 2

## 2017-03-19 MED ORDER — FENTANYL 2.5 MCG/ML BUPIVACAINE 1/10 % EPIDURAL INFUSION (WH - ANES)
14.0000 mL/h | INTRAMUSCULAR | Status: DC | PRN
  Administered 2017-03-19: 14 mL/h via EPIDURAL
  Administered 2017-03-19: 10 mL/h via EPIDURAL
  Filled 2017-03-19 (×2): qty 100

## 2017-03-19 MED ORDER — LACTATED RINGERS IV SOLN
500.0000 mL | Freq: Once | INTRAVENOUS | Status: AC
  Administered 2017-03-19: 500 mL via INTRAVENOUS

## 2017-03-19 MED ORDER — OXYTOCIN 40 UNITS IN LACTATED RINGERS INFUSION - SIMPLE MED
1.0000 m[IU]/min | INTRAVENOUS | Status: DC
  Administered 2017-03-19: 2 m[IU]/min via INTRAVENOUS
  Filled 2017-03-19: qty 1000

## 2017-03-19 MED ORDER — LIDOCAINE HCL (PF) 1 % IJ SOLN
INTRAMUSCULAR | Status: DC | PRN
  Administered 2017-03-19 (×2): 5 mL via EPIDURAL

## 2017-03-19 MED ORDER — TERBUTALINE SULFATE 1 MG/ML IJ SOLN
0.2500 mg | Freq: Once | INTRAMUSCULAR | Status: DC | PRN
  Filled 2017-03-19: qty 1

## 2017-03-19 MED ORDER — PHENYLEPHRINE 40 MCG/ML (10ML) SYRINGE FOR IV PUSH (FOR BLOOD PRESSURE SUPPORT)
80.0000 ug | PREFILLED_SYRINGE | INTRAVENOUS | Status: DC | PRN
  Administered 2017-03-19: 80 ug via INTRAVENOUS
  Filled 2017-03-19: qty 5

## 2017-03-19 MED ORDER — FENTANYL CITRATE (PF) 100 MCG/2ML IJ SOLN
100.0000 ug | INTRAMUSCULAR | Status: DC | PRN
  Administered 2017-03-19: 100 ug via INTRAVENOUS
  Filled 2017-03-19 (×2): qty 2

## 2017-03-19 MED ORDER — HYDROXYZINE HCL 25 MG PO TABS
25.0000 mg | ORAL_TABLET | Freq: Three times a day (TID) | ORAL | Status: DC | PRN
  Administered 2017-03-19 (×2): 25 mg via ORAL
  Filled 2017-03-19 (×3): qty 1

## 2017-03-19 NOTE — Anesthesia Preprocedure Evaluation (Signed)
Anesthesia Evaluation  Patient identified by MRN, date of birth, ID band Patient awake    Reviewed: Allergy & Precautions, H&P , NPO status , Patient's Chart, lab work & pertinent test results, reviewed documented beta blocker date and time   Airway Mallampati: II  TM Distance: >3 FB Neck ROM: full    Dental no notable dental hx.    Pulmonary neg pulmonary ROS, former smoker,    Pulmonary exam normal breath sounds clear to auscultation       Cardiovascular negative cardio ROS Normal cardiovascular exam Rhythm:regular Rate:Normal     Neuro/Psych negative neurological ROS  negative psych ROS   GI/Hepatic negative GI ROS, Neg liver ROS,   Endo/Other  negative endocrine ROS  Renal/GU negative Renal ROS  negative genitourinary   Musculoskeletal   Abdominal   Peds  Hematology negative hematology ROS (+)   Anesthesia Other Findings   Reproductive/Obstetrics (+) Pregnancy                             Anesthesia Physical Anesthesia Plan  ASA: II  Anesthesia Plan: Epidural   Post-op Pain Management:    Induction:   PONV Risk Score and Plan:   Airway Management Planned:   Additional Equipment:   Intra-op Plan:   Post-operative Plan:   Informed Consent: I have reviewed the patients History and Physical, chart, labs and discussed the procedure including the risks, benefits and alternatives for the proposed anesthesia with the patient or authorized representative who has indicated his/her understanding and acceptance.     Plan Discussed with:   Anesthesia Plan Comments:         Anesthesia Quick Evaluation  

## 2017-03-19 NOTE — Progress Notes (Signed)
LABOR PROGRESS NOTE  Sue Graves is a 21 y.o. G3P0020 at 404w6d  admitted for IOL for GHTN  Subjective: Patient reports feeling more pressure.   Objective: BP 100/66   Pulse (!) 117   Temp 99.3 F (37.4 C) (Oral)   Resp 18   Ht 4\' 11"  (1.499 m)   Wt 125 lb (56.7 kg)   LMP 06/16/2016 Comment: first period since miscarriage  SpO2 100%   BMI 25.25 kg/m  or  Vitals:   03/19/17 1701 03/19/17 1704 03/19/17 1731 03/19/17 1801  BP:  104/71 116/68 100/66  Pulse:  (!) 102 (!) 104 (!) 117  Resp:  18  18  Temp: 99.3 F (37.4 C)     TempSrc: Oral     SpO2:      Weight:      Height:        SVE: 6.5/90/-1 FHT: baseline rate 140, moderate varibility, +acel, no decel Toco: ctx q 2-3 min  Pitocin rate: 648mU/min   Assessment / Plan: 21 y.o. G3P0020 at 664w6d here for IOL for gHTN.   Labor: AROM [id number], clear fluid. Continue Pitocin Fetal Wellbeing:  Cat I Pain Control:  Well-controlled with epidural ID: GBS pos - PCN, first dose at 1129 Anticipated MOD:  SVD  Frederik PearJulie P Kayle Passarelli, MD 03/19/2017, 6:25 PM

## 2017-03-19 NOTE — Progress Notes (Signed)
LABOR PROGRESS NOTE  Sue Graves is a 21 y.o. G3P0020 at 4248w6d  admitted for IOL for GHTN  Subjective: Doing well. Denies any concerns  Objective: BP 131/75   Pulse 98   Temp 98.2 F (36.8 C) (Oral)   Resp 17   Ht 4\' 11"  (1.499 m)   Wt 125 lb (56.7 kg)   LMP 06/16/2016 Comment: first period since miscarriage  BMI 25.25 kg/m  or  Vitals:   03/19/17 0103 03/19/17 0226 03/19/17 0636 03/19/17 0830  BP:  133/89 98/80 131/75  Pulse:  96 88 98  Resp: 18 20 20 17   Temp:   98.2 F (36.8 C)   TempSrc:   Oral   Weight:      Height:        Last SVE Dilation: 1 Effacement (%): 70 Cervical Position: Posterior Station: -2 Presentation: Vertex Exam by:: Dr Talbert ForestShirley & Genella RifeK Booker CNM FHT: baseline rate 130, moderate varibility, +acel, occasional variable Toco: ctx q 4-5 min   Assessment / Plan: 21 y.o. G3P0020 at 448w6d here for IOL for gHTN.   Labor: FB in place (placed at 0040). Continue expectant management for now. Will start Pitocin when Fb out.  Fetal Wellbeing:  Cat I Pain Control:  Per patient's request. Planning on epidural when in labor ID: GBS pos - start PCN when in active labor or ROM Anticipated MOD:  SVD  Frederik PearJulie P Degele, MD 03/19/2017, 9:58 AM

## 2017-03-19 NOTE — Progress Notes (Signed)
Patient ID: Sue Graves, female   DOB: 11-04-1995, 21 y.o.   MRN: [id number] Sue Graves is a 21 y.o. G3P0020 at 2167w6d admitted [facility]  Subjective: Doing well  Objective: BP (!) 142/98   Pulse (!) 102   Temp 98.9 F (37.2 C) (Oral)   Resp 18   Ht 4\' 11"  (1.499 m)   Wt 56.7 kg (125 lb)   LMP 06/16/2016 Comment: first period since miscarriage  BMI 25.25 kg/m  No intake/output data recorded.  FHT:  FHR: 140 bpm, variability: moderate,  accelerations:  Present,  decelerations:  Absent UC:   irregular  SVE:  1/70/-1, soft, vtx Cervical foley bulb inserted and inflated w/ 60ml LR w/o difficulty   Labs: Lab Results  Component Value Date   WBC 7.7 03/18/2017   HGB 11.9 (L) 03/18/2017   HCT 34.1 (L) 03/18/2017   MCV 93.4 03/18/2017   PLT 226 03/18/2017    Assessment / Plan: IOL d/t GHTN, cervical foley bulb placed  Labor: ripening phase Fetal Wellbeing:  Category I Pain Control:  n/a Pre-eclampsia: bp's stable, labs normal I/D:  Plan pcn for GBS+ w/ arom/pit/labor, whichever 1st Anticipated MOD:  NSVD  Marge DuncansBooker, Ison Wichmann Randall CNM, WHNP-BC 03/19/2017, 12:43 AM

## 2017-03-19 NOTE — Anesthesia Procedure Notes (Signed)
Epidural Patient location during procedure: OB Start time: 03/19/2017 11:05 AM End time: 03/19/2017 11:16 AM  Staffing Anesthesiologist: Dariel Pellecchia  Preanesthetic Checklist Completed: patient identified, site marked, surgical consent, pre-op evaluation, timeout performed, IV checked, risks and benefits discussed and monitors and equipment checked  Epidural Patient position: sitting Prep: site prepped and draped and DuraPrep Patient monitoring: continuous pulse ox and blood pressure Approach: midline Location: L3-L4 Injection technique: LOR air  Needle:  Needle type: Tuohy  Needle gauge: 17 G Needle length: 9 cm and 9 Needle insertion depth: 6 cm Catheter type: closed end flexible Catheter size: 19 Gauge Catheter at skin depth: 10 cm Test dose: negative  Assessment Events: blood not aspirated, injection not painful, no injection resistance, negative IV test and no paresthesia

## 2017-03-19 NOTE — Anesthesia Pain Management Evaluation Note (Addendum)
  CRNA Pain Management Visit Note  Patient: Sue Graves, 21 y.o., female  "Hello I am a [facility] of the [facility]. We have an anesthesia team available at all times to provide care throughout the hospital, including epidural management and anesthesia for C-section. I don't know your plan for the delivery whether it a natural birth, water birth, IV sedation, nitrous supplementation, doula or epidural, but we want to meet your pain goals."   1.Was your pain managed to your expectations on prior hospitalizations?   Unable to assess - patient sleeping  2.What is your expectation for pain management during this hospitalization?     Epidural  3.How can we help you reach that goal? Plans epidural  Record the patient's initial score and the patient's pain goal.   Pain: Patient sleeping - unable to assess  Pain Goal: Patient sleeping - unable to assess [facility] wants you to be able to say your pain was always managed very well.  Nichele Slawson 03/19/2017

## 2017-03-19 NOTE — Progress Notes (Signed)
Pt very anxious about foley bulb placement and sve. Provider and RN discussed options and procedure. Agreed pt should take some time and think about it. Will reevaluate.

## 2017-03-20 ENCOUNTER — Encounter (HOSPITAL_COMMUNITY): Payer: Self-pay | Admitting: Emergency Medicine

## 2017-03-20 LAB — CBC
HCT: 29 % — ABNORMAL LOW (ref 36.0–46.0)
Hemoglobin: 10.2 g/dL — ABNORMAL LOW (ref 12.0–15.0)
MCH: 33 pg (ref 26.0–34.0)
MCHC: 35.2 g/dL (ref 30.0–36.0)
MCV: 93.9 fL (ref 78.0–100.0)
PLATELETS: 215 10*3/uL (ref 150–400)
RBC: 3.09 MIL/uL — AB (ref 3.87–5.11)
RDW: 13.8 % (ref 11.5–15.5)
WBC: 18.5 10*3/uL — ABNORMAL HIGH (ref 4.0–10.5)

## 2017-03-20 MED ORDER — ACETAMINOPHEN 160 MG/5ML PO SOLN: 650.0000 mg | ORAL | Status: DC | PRN

## 2017-03-20 MED ORDER — BENZOCAINE-MENTHOL 20-0.5 % EX AERO
1.0000 "application " | INHALATION_SPRAY | CUTANEOUS | Status: DC | PRN
  Administered 2017-03-20: 1 via TOPICAL
  Filled 2017-03-20: qty 56

## 2017-03-20 MED ORDER — ONDANSETRON HCL 4 MG/2ML IJ SOLN: 4.0000 mg | INTRAMUSCULAR | Status: DC | PRN

## 2017-03-20 MED ORDER — SIMETHICONE 80 MG PO CHEW: 80.0000 mg | CHEWABLE_TABLET | ORAL | Status: DC | PRN

## 2017-03-20 MED ORDER — MISOPROSTOL 200 MCG PO TABS
ORAL_TABLET | ORAL | Status: AC
  Administered 2017-03-20: 800 ug
  Filled 2017-03-20: qty 5

## 2017-03-20 MED ORDER — OXYCODONE HCL 5 MG/5ML PO SOLN: 5.0000 mg | ORAL | Status: DC | PRN

## 2017-03-20 MED ORDER — TETANUS-DIPHTH-ACELL PERTUSSIS 5-2.5-18.5 LF-MCG/0.5 IM SUSP: 0.5000 mL | Freq: Once | INTRAMUSCULAR | Status: DC

## 2017-03-20 MED ORDER — SENNOSIDES-DOCUSATE SODIUM 8.6-50 MG PO TABS
2.0000 | ORAL_TABLET | ORAL | Status: DC
  Administered 2017-03-20 (×2): 2 via ORAL
  Filled 2017-03-20 (×2): qty 2

## 2017-03-20 MED ORDER — COMPLETENATE 29-1 MG PO CHEW
1.0000 | CHEWABLE_TABLET | Freq: Every day | ORAL | Status: DC
  Administered 2017-03-20: 1 via ORAL
  Filled 2017-03-20 (×3): qty 1

## 2017-03-20 MED ORDER — METHYLERGONOVINE MALEATE 0.2 MG/ML IJ SOLN
0.2000 mg | INTRAMUSCULAR | Status: DC | PRN
  Administered 2017-03-20: 0.2 mg via INTRAMUSCULAR

## 2017-03-20 MED ORDER — OXYTOCIN 40 UNITS IN LACTATED RINGERS INFUSION - SIMPLE MED
INTRAVENOUS | Status: AC
  Administered 2017-03-20: 500 mL via INTRAVENOUS
  Filled 2017-03-20: qty 1000

## 2017-03-20 MED ORDER — ACETAMINOPHEN 325 MG PO TABS: 650.0000 mg | ORAL_TABLET | ORAL | Status: DC | PRN

## 2017-03-20 MED ORDER — ONDANSETRON HCL 4 MG PO TABS: 4.0000 mg | ORAL_TABLET | ORAL | Status: DC | PRN

## 2017-03-20 MED ORDER — METHYLERGONOVINE MALEATE 0.2 MG PO TABS: 0.2000 mg | ORAL_TABLET | ORAL | Status: DC | PRN

## 2017-03-20 MED ORDER — ZOLPIDEM TARTRATE 5 MG PO TABS: 5.0000 mg | ORAL_TABLET | Freq: Every evening | ORAL | Status: DC | PRN

## 2017-03-20 MED ORDER — PRENATAL MULTIVITAMIN CH: 1.0000 | ORAL_TABLET | Freq: Every day | ORAL | Status: DC

## 2017-03-20 MED ORDER — DIBUCAINE 1 % RE OINT
1.0000 "application " | TOPICAL_OINTMENT | RECTAL | Status: DC | PRN
  Administered 2017-03-20: 1 via RECTAL
  Filled 2017-03-20: qty 28

## 2017-03-20 MED ORDER — OXYCODONE HCL 5 MG PO TABS: 5.0000 mg | ORAL_TABLET | ORAL | Status: DC | PRN

## 2017-03-20 MED ORDER — IBUPROFEN 600 MG PO TABS
600.0000 mg | ORAL_TABLET | Freq: Four times a day (QID) | ORAL | Status: DC
  Administered 2017-03-20: 600 mg via ORAL
  Filled 2017-03-20: qty 1

## 2017-03-20 MED ORDER — DIPHENHYDRAMINE HCL 25 MG PO CAPS: 25.0000 mg | ORAL_CAPSULE | Freq: Four times a day (QID) | ORAL | Status: DC | PRN

## 2017-03-20 MED ORDER — FENTANYL CITRATE (PF) 100 MCG/2ML IJ SOLN
INTRAMUSCULAR | Status: AC
  Administered 2017-03-20: 100 ug via INTRAVENOUS
  Filled 2017-03-20: qty 2

## 2017-03-20 MED ORDER — WITCH HAZEL-GLYCERIN EX PADS
1.0000 "application " | MEDICATED_PAD | CUTANEOUS | Status: DC | PRN
  Administered 2017-03-20: 1 via TOPICAL

## 2017-03-20 MED ORDER — FENTANYL CITRATE (PF) 100 MCG/2ML IJ SOLN
100.0000 ug | Freq: Once | INTRAMUSCULAR | Status: AC
  Administered 2017-03-20: 100 ug via INTRAVENOUS

## 2017-03-20 MED ORDER — IBUPROFEN 100 MG/5ML PO SUSP
600.0000 mg | Freq: Four times a day (QID) | ORAL | Status: DC
  Administered 2017-03-20 – 2017-03-21 (×6): 600 mg via ORAL
  Filled 2017-03-20 (×10): qty 30

## 2017-03-20 MED ORDER — COCONUT OIL OIL: 1.0000 "application " | TOPICAL_OIL | Status: DC | PRN

## 2017-03-20 NOTE — Progress Notes (Signed)
CSW acknowledges consult.  CSW attempted to meet with MOB, however MOB had several room guest.  CSW will attempt to visit with MOB at a later time.   Deroy Noah Boyd-Gilyard, MSW, LCSW Clinical Social Work [phone]  

## 2017-03-20 NOTE — Progress Notes (Signed)
Pt went to bathroom to void prior to 4 hour check and voided a large amount, bleeding was light. She went back to bed and said she felt a large gush and "something came out". Writer assessed pad and a med clot was noted. Writer called Press photographercharge nurse and asked for assistance. Charge nurse came to assist and MD was notified via call and en route. PPH called, staff to room, pt was given bolus of pitocin, cytotec given bucally, and 0.2mg  IM methergine per Dr. Despina HiddenEure order. MD ordered methergine stating he was aware of pt BP. Dr Despina HiddenEure performed manual exam and was able to extract several medium clots. Pads panties and washcloths were weighed and EBL was estimated to be 1448. CRNA started 2nd IV in (L) AC as precautionary measure. Fundus found to be firm U/1, and bleeding had slowed. Will continue to monitor.

## 2017-03-20 NOTE — Progress Notes (Signed)
Post Partum Day #1 1/2 Subjective: no complaints, up ad lib, voiding, tolerating PO and Her bleeding is much better now after treatment for PPH.  Objective: Blood pressure (!) 143/91, pulse (!) 105, temperature 98.3 F (36.8 C), temperature source Oral, resp. rate 18, height 4\' 11"  (1.499 m), weight 56.7 kg (125 lb), last menstrual period 06/16/2016, SpO2 99 %, unknown if currently breastfeeding.  Physical Exam:  General: alert Lochia: appropriate Uterine Fundus: firm and NT at U DVT Evaluation: No evidence of DVT seen on physical exam.   Recent Labs  03/19/17 0945 03/20/17 0656  HGB 12.1 10.2*  HCT 35.9* 29.0*    Assessment/Plan: Plan for discharge tomorrow   LOS: 2 days   Mir Fullilove C Margrete Delude 03/20/2017, 7:56 AM

## 2017-03-20 NOTE — Progress Notes (Signed)
Pt not able to swallow Motrin pill b/c she says she cannot swallow larger pills (writer used pill cutter and cut pill into eighths and she still couldn't swallow it), she was able to get Senna down. Called pharmacy and requested that they send liquid Motrin going forward for pt. They said they would.

## 2017-03-21 MED ORDER — AMLODIPINE BESYLATE 5 MG PO TABS
5.0000 mg | ORAL_TABLET | Freq: Every day | ORAL | Status: DC
  Administered 2017-03-21: 5 mg via ORAL
  Filled 2017-03-21 (×2): qty 1

## 2017-03-21 MED ORDER — AMLODIPINE BESYLATE 5 MG PO TABS: 5.0000 mg | ORAL_TABLET | Freq: Every day | ORAL | 2 refills | Status: DC

## 2017-03-21 MED ORDER — ACETAMINOPHEN 160 MG/5ML PO SOLN: 650.0000 mg | ORAL | 0 refills | Status: DC | PRN

## 2017-03-21 NOTE — Clinical Social Work Maternal (Signed)
CLINICAL SOCIAL WORK MATERNAL/CHILD NOTE  Patient Details  Name: Sue Graves MRN: [id number] Date of Birth: [birth year]  Date:  03/21/2017  Clinical Social Worker Initiating Note:  Laurey Arrow Date/ Time Initiated:  03/21/17/1146     Child's Name:  Sirr Dykes   Legal Guardian:  Mother (MOB did not want to disclose FOB's information.  Per MOB, FOB is not going to be involved. )   Need for Interpreter:  None   Date of Referral:  03/20/17     Reason for Referral:  Behavioral Health Issues, including SI , Current Substance Use/Substance Use During Pregnancy  (hx of depression and hx of THC use during pregnancy. )   Referral Source:  Central Nursery   Address:  [address] [place] [address]  Phone number:  [phone]   Household Members:  Self   Natural Supports (not living in the home):  Immediate Family, Extended Family, Parent   Professional Supports: None   Employment: Unemployed   Type of Work:     Education:  9 to 11 years   Pensions consultant:  [employer]   Other Resources:  [employer], Physicist, medical    Cultural/Religious Considerations Which May Impact Care:  None Reported  Strengths:  Ability to meet basic needs , Engineer, materials , Home prepared for child , Understanding of illness   Risk Factors/Current Problems:  Substance Use , Mental Health Concerns    Cognitive State:  Able to Concentrate , Alert , Linear Thinking , Insightful    Mood/Affect:  Happy , Bright , Relaxed , Interested , Comfortable    CSW Assessment: CSW met with MOB in room 122 to complete an assessment for hx of depression and hx of SA.  With MOB's permission, CSW asked MOB guest to leave the room in effort to me with MOB in private.  MOB was polite, forthcoming, and receptive to meeting with CSW. CSW explained CSW's role and encouraged MOB to ask questions during the assessment.   CSW inquired about MOB's MH and MOB reported a signs and symptoms of depression  during pregnancy.  CSW assessed for safety and MOB denied SI, HI, DV. CSW provided education regarding Baby Blues vs PMADs and provided MOB with information about support groups held at [place] encouraged MOB to evaluate her mental health throughout the postpartum period with the use of the New Mom Checklist developed by Postpartum Progress and notify a medical professional if symptoms arise.  MOB appeared to have insight and awareness about her MH and reports feeling comfortable assessing help if a need arise.   CSW asked MOB about MOB's SA hx and MOB disclosed the use of THC during pregnancy.  MOB reported that MOB used marijuana to decrease MOB's heart burn, increase MOB's appetite, and to decrease MOB's vomiting. MOB reported MOB's last use of marijuana was about 30 days ago.  CSW explained hospital's SA policy and procedure and MOB was understanding. CSW made MOB aware that infant's UDS was negative and CSW will continue to monitor infant's CDS and will make a report to [place] if warranted. CSW offered MOB SA resources and MOB declined.   CSW provided SIDS education and MOB answered CSW questions appropriately and asked appropriately questions. MOB reports having all necessary items for infant and feeling prepared to parent.  CSW thanked MOB for meeting with CSW and provided MOB with CSW's contact information.    CSW Plan/Description:  Information/Referral to [employer] , [employer] ,  No Further Intervention Required/No Barriers to Discharge (CSW will monitor infant's CDS and will make a report to [place] CPS if warranted. )   Angel Boyd-Gilyard, MSW, LCSW Clinical Social Work [phone]  ANGEL D BOYD-GILYARD, LCSW 03/21/2017, 11:58 AM  

## 2017-03-21 NOTE — Discharge Summary (Signed)
OB Discharge Summary     Patient Name: Sue Graves DOB: [birth year] MRN: [id number]  Date of admission: 03/18/2017 Delivering MD: SHIRLEY, [place]ORD[place]   Date of discharge: 03/21/2017  Admitting diagnosis: 38.5wks CTX Intrauterine pregnancy: 9458w6d     Secondary diagnosis:  Active Problems:   Labor and delivery, indication for care  Additional problems: gHTN; GBS pos     Discharge diagnosis: Term Pregnancy Delivered and Gestational Hypertension                                                                                                Post partum procedures:none  Augmentation: AROM, Pitocin and Foley Balloon  Complications: Hemorrhage>102800mL  Hospital course:  Induction of Labor With Vaginal Delivery   21 y.o. yo [id number] at 858w6d was admitted to the hospital 03/18/2017 for induction of labor.  Indication for induction: Gestational hypertension.  Patient had an uncomplicated labor course as follows: Membrane Rupture Time/Date: 6:20 PM ,03/19/2017   Intrapartum Procedures: Episiotomy: None [1]                                         Lacerations:  1st degree [2];Perineal [11]  Patient had delivery of a Viable infant.  Information for the patient's newborn:  Roosvelt MaserMaye, Boy Monea [[id number]][[id number]]  Delivery Method: Vaginal, Spontaneous Delivery (Filed from Delivery Summary)   03/19/2017  Details of delivery can be found in separate delivery note. Approx 5-6 hr PP pt was noted to have increased clots/bldg. Dr Despina HiddenEure examined her, removed clots. Her EBL was weighed and determined to be 1448. She was given methergine, cytotec, and additional Pitocin. Hgb from 12.1>10.2. [facility] had a routine postpartum course. Pt denied dizziness with ambulation on PPD#2, but her BPs were noted to be borderline and so Norvasc 5 was started. Patient is discharged home 03/21/17.  Physical exam  Vitals:   03/20/17 0650 03/20/17 1330 03/20/17 1857 03/21/17 0604  BP: (!) 143/91 132/72 137/73 (!) 141/87   Pulse: (!) 105 95 93 85  Resp:  18 18 18   Temp:  99.1 F (37.3 C) 98.4 F (36.9 C) 97.9 F (36.6 C)  TempSrc:  Oral Oral Oral  SpO2: 100%     Weight:      Height:       General: alert and cooperative Lochia: appropriate Uterine Fundus: firm Incision: N/A DVT Evaluation: No evidence of DVT seen on physical exam. Labs: Lab Results  Component Value Date   WBC 18.5 (H) 03/20/2017   HGB 10.2 (L) 03/20/2017   HCT 29.0 (L) 03/20/2017   MCV 93.9 03/20/2017   PLT 215 03/20/2017   CMP Latest Ref Rng & Units 03/18/2017  Glucose 65 - 99 mg/dL 696(E103(H)  BUN 6 - 20 mg/dL 7  Creatinine 9.520.44 - 8.411.00 mg/dL 3.240.76  Sodium 401135 - 027145 mmol/L 137  Potassium 3.5 - 5.1 mmol/L 3.2(L)  Chloride 101 - 111 mmol/L 107  CO2 22 - 32 mmol/L 22  Calcium 8.9 - 10.3 mg/dL 9.1  Total Protein 6.5 - 8.1 g/dL 6.9  Total Bilirubin 0.3 - 1.2 mg/dL 0.4  Alkaline Phos 38 - 126 U/L 160(H)  AST 15 - 41 U/L 23  ALT 14 - 54 U/L 17    Discharge instruction: per After Visit Summary and "Baby and Me Booklet".  After visit meds:  Allergies as of 03/21/2017      Reactions   Other Itching   Scented soaps/body wash   Tomato Hives, Swelling      Medication List    STOP taking these medications   ondansetron 4 MG disintegrating tablet Commonly known as:  ZOFRAN ODT   ranitidine 150 MG/10ML syrup Commonly known as:  ZANTAC     TAKE these medications   acetaminophen 160 MG/5ML solution Commonly known as:  TYLENOL Take 20.3 mLs (650 mg total) by mouth every 4 (four) hours as needed (pain scale <4).   amLODipine 5 MG tablet Commonly known as:  NORVASC Take 1 tablet (5 mg total) by mouth daily.       Diet: routine diet  Activity: Advance as tolerated. Pelvic rest for 6 weeks.   Outpatient follow up:1 week for BP check, then 4 wk pp visit- inbox msg to [facility] staff Follow up Appt:Future Appointments Date Time Provider Department Center  04/17/2017 11:15 AM Constant, Gigi GinPeggy, MD [facility] None   Follow up  Visit:No Follow-up on file.  Postpartum contraception: Combination OCPs  Newborn Data: Live born female  Birth Weight: 6 lb 11.1 oz (3036 g) APGAR: 8, 9  Baby Feeding: Bottle and Breast Disposition:home with mother   03/21/2017 Cam HaiSHAW, Khloei Spiker, CNM  8:54 AM

## 2017-03-21 NOTE — Discharge Instructions (Signed)
Postpartum Hypertension °Postpartum hypertension is high blood pressure after pregnancy that remains higher than normal for more than two days after delivery. You may not realize that you have postpartum hypertension if your blood pressure is not being checked regularly. In some cases, postpartum hypertension will go away on its own, usually within a week of delivery. However, for some women, medical treatment is required to prevent serious complications, such as seizures or stroke. °The following things can affect your blood pressure: °· The type of delivery you had. °· Having received IV fluids or other medicines during or after delivery. ° °What are the causes? °Postpartum hypertension may be caused by any of the following or by a combination of any of the following: °· Hypertension that existed before pregnancy (chronic hypertension). °· Gestational hypertension. °· Preeclampsia or eclampsia. °· Receiving a lot of fluid through an IV during or after delivery. °· Medicines. °· HELLP syndrome. °· Hyperthyroidism. °· Stroke. °· Other rare neurological or blood disorders. ° °In some cases, the cause may not be known. °What increases the risk? °Postpartum hypertension can be related to one or more risk factors, such as: °· Chronic hypertension. In some cases, this may not have been diagnosed before pregnancy. °· Obesity. °· Type 2 diabetes. °· Kidney disease. °· Family history of preeclampsia. °· Other medical conditions that cause hormonal imbalances. ° °What are the signs or symptoms? °As with all types of hypertension, postpartum hypertension may not have any symptoms. Depending on how high your blood pressure is, you may experience: °· Headaches. These may be mild, moderate, or severe. They may also be steady, constant, or sudden in onset (thunderclap headache). °· Visual changes. °· Dizziness. °· Shortness of breath. °· Swelling of your hands, feet, lower legs, or face. In some cases, you may have swelling in  more than one of these locations. °· Heart palpitations or a racing heartbeat. °· Difficulty breathing while lying down. °· Decreased urination. ° °Other rare signs and symptoms may include: °· Sweating more than usual. This lasts longer than a few days after delivery. °· Chest pain. °· Sudden dizziness when you get up from sitting or lying down. °· Seizures. °· Nausea or vomiting. °· Abdominal pain. ° °How is this diagnosed? °The diagnosis of postpartum hypertension is made through a combination of physical examination findings and testing of your blood and urine. You may also have additional tests, such as a CT scan or an MRI, to check for other complications of postpartum hypertension. °How is this treated? °When blood pressure is high enough to require treatment, your options may include: °· Medicines to reduce blood pressure (antihypertensives). Tell your health care provider if you are breastfeeding or if you plan to breastfeed. There are many antihypertensive medicines that are safe to take while breastfeeding. °· Stopping medicines that may be causing hypertension. °· Treating medical conditions that are causing hypertension. °· Treating the complications of hypertension, such as seizures, stroke, or kidney problems. ° °Your health care provider will also continue to monitor your blood pressure closely and repeatedly until it is within a safe range for you. °Follow these instructions at home: °· Take medicines only as directed by your health care provider. °· Get regular exercise after your health care provider tells you that it is safe. °· Follow your health care provider’s recommendations on fluid and salt restrictions. °· Do not use any tobacco products, including cigarettes, chewing tobacco, or electronic cigarettes. If you need help quitting, ask your health care provider. °·   Keep all follow-up visits as directed by your health care provider. This is important. °Contact a health care provider  if: °· Your symptoms get worse. °· You have new symptoms, such as: °? Headache. °? Dizziness. °? Visual changes. °Get help right away if: °· You develop a severe or sudden headache. °· You have seizures. °· You develop numbness or weakness on one side of your body. °· You have difficulty thinking, speaking, or swallowing. °· You develop severe abdominal pain. °· You develop difficulty breathing, chest pain, a racing heartbeat, or heart palpitations. °These symptoms may represent a serious problem that is an emergency. Do not wait to see if the symptoms will go away. Get medical help right away. Call your local emergency services (911 in the [place] Do not drive yourself to the hospital. °This information is not intended to replace advice given to you by your health care provider. Make sure you discuss any questions you have with your health care provider. °Document Released: 04/08/2014 Document Revised: 01/08/2016 Document Reviewed: 02/17/2014 °Elsevier Interactive Patient Education © 2018 [employer] ° °Home Care Instructions for Mom °ACTIVITY °· Gradually return to your regular activities. °· Let yourself rest. Nap while your baby sleeps. °· Avoid lifting anything that is heavier than 10 lb (4.5 kg) until your health care provider says it is okay. °· Avoid activities that take a lot of effort and energy (are strenuous) until approved by your health care provider. Walking at a slow-to-moderate pace is usually safe. °· If you had a cesarean delivery: °? Do not vacuum, climb stairs, or drive a car for 4-6 weeks. °? Have someone help you at home until you feel like you can do your usual activities yourself. °? Do exercises as told by your health care provider, if this applies. ° °VAGINAL BLEEDING °You may continue to bleed for 4-6 weeks after delivery. Over time, the amount of blood usually decreases and the color of the blood usually gets lighter. However, the flow of bright red blood may increase if you have been  too active. If you need to use more than one pad in an hour because your pad gets soaked, or if you pass a large clot: °· Lie down. °· Raise your feet. °· Place a cold compress on your lower abdomen. °· Rest. °· Call your health care provider. ° °If you are breastfeeding, your period should return anytime between 8 weeks after delivery and the time that you stop breastfeeding. If you are not breastfeeding, your period should return 6-8 weeks after delivery. °PERINEAL CARE °The perineal area, or perineum, is the part of your body between your thighs. After delivery, this area needs special care. Follow these instructions as told by your health care provider. °· Take warm tub baths for 15-20 minutes. °· Use medicated pads and pain-relieving sprays and creams as told. °· Do not use tampons or douches until vaginal bleeding has stopped. °· Each time you go to the bathroom: °? Use a peri bottle. °? Change your pad. °? Use towelettes in place of toilet paper until your stitches have healed. °· Do Kegel exercises every day. Kegel exercises help to maintain the muscles that support the vagina, bladder, and bowels. You can do these exercises while you are standing, sitting, or lying down. To do Kegel exercises: °? Tighten the muscles of your abdomen and the muscles that surround your birth canal. °? Hold for a few seconds. °? Relax. °? Repeat until you have done this 5 times   in a row. °· To prevent hemorrhoids from developing or getting worse: °? Drink enough fluid to keep your urine clear or pale yellow. °? Avoid straining when having a bowel movement. °? Take over-the-counter medicines and stool softeners as told by your health care provider. ° °BREAST CARE °· Wear a tight-fitting bra. °· Avoid taking over-the-counter pain medicine for breast discomfort. °· Apply ice to the breasts to help with discomfort as needed: °? Put ice in a plastic bag. °? Place a towel between your skin and the bag. °? Leave the ice on for 20  minutes or as told by your health care provider. ° °NUTRITION °· Eat a well-balanced diet. °· Do not try to lose weight quickly by cutting back on calories. °· Take your prenatal vitamins until your postpartum checkup or until your health care provider tells you to stop. ° °POSTPARTUM DEPRESSION °You may find yourself crying for no apparent reason and unable to cope with all of the changes that come with having a newborn. This mood is called postpartum depression. Postpartum depression happens because your hormone levels change after delivery. If you have postpartum depression, get support from your partner, friends, and family. If the depression does not go away on its own after several weeks, contact your health care provider. °BREAST SELF-EXAM °Do a breast self-exam each month, at the same time of the month. If you are breastfeeding, check your breasts just after a feeding, when your breasts are less full. If you are breastfeeding and your period has started, check your breasts on day 5, 6, or 7 of your period. °Report any lumps, bumps, or discharge to your health care provider. Know that breasts are normally lumpy if you are breastfeeding. This is temporary, and it is not a health risk. °INTIMACY AND SEXUALITY °Avoid sexual activity for at least 3-4 weeks after delivery or until the brownish-red vaginal flow is completely gone. If you want to avoid pregnancy, use some form of birth control. You can get pregnant after delivery, even if you have not had your period. °SEEK MEDICAL CARE IF: °· You feel unable to cope with the changes that a child brings to your life, and these feelings do not go away after several weeks. °· You notice a lump, a bump, or discharge on your breast. ° °SEEK IMMEDIATE MEDICAL CARE IF: °· Blood soaks your pad in 1 hour or less. °· You have: °? Severe pain or cramping in your lower abdomen. °? A bad-smelling vaginal discharge. °? A fever that is not controlled by medicine. °? A fever, and  an area of your breast is red and sore. °? Pain or redness in your calf. °? Sudden, severe chest pain. °? Shortness of breath. °? Painful or bloody urination. °? Problems with your vision. °· You vomit for 12 hours or longer. °· You develop a severe headache. °· You have serious thoughts about hurting yourself, your child, or anyone else. ° °This information is not intended to replace advice given to you by your health care provider. Make sure you discuss any questions you have with your health care provider. °Document Released: 08/02/2000 Document Revised: 01/11/2016 Document Reviewed: 02/06/2015 °Elsevier Interactive Patient Education © 2017 [employer] ° °

## 2017-03-22 LAB — TYPE AND SCREEN
ABO/RH(D): B POS
ANTIBODY SCREEN: NEGATIVE
UNIT DIVISION: 0
Unit division: 0

## 2017-03-22 LAB — BPAM RBC
BLOOD PRODUCT EXPIRATION DATE: 201808222359
Blood Product Expiration Date: 201808222359
UNIT TYPE AND RH: 5100
Unit Type and Rh: 5100

## 2017-03-28 NOTE — Anesthesia Postprocedure Evaluation (Signed)
Anesthesia Post Note  Patient: Sue Graves  Procedure(s) Performed: * No procedures listed *     Patient location during evaluation: Mother Baby Anesthesia Type: Epidural Level of consciousness: awake and alert Pain management: pain level controlled Vital Signs Assessment: post-procedure vital signs reviewed and stable Respiratory status: spontaneous breathing, nonlabored ventilation and respiratory function stable Cardiovascular status: stable Postop Assessment: no headache, no backache and epidural receding Anesthetic complications: no    Last Vitals: There were no vitals filed for this visit.  Last Pain: There were no vitals filed for this visit.               Jiles GarterJACKSON,Jovontae Banko EDWARD

## 2017-04-17 ENCOUNTER — Encounter: Payer: Self-pay | Admitting: Obstetrics and Gynecology

## 2017-04-17 ENCOUNTER — Ambulatory Visit (INDEPENDENT_AMBULATORY_CARE_PROVIDER_SITE_OTHER): Payer: Medicaid Other | Admitting: Obstetrics and Gynecology

## 2017-04-17 MED ORDER — NORETHIN ACE-ETH ESTRAD-FE 1-20 MG-MCG(24) PO TABS: 1.0000 | ORAL_TABLET | Freq: Every day | ORAL | 11 refills | Status: DC

## 2017-04-17 NOTE — Progress Notes (Signed)
Subjective:     Sue Graves is a 21 y.o. female who presents for a postpartum visit. She is 4 weeks postpartum following a spontaneous vaginal delivery. I have fully reviewed the prenatal and intrapartum course. The [facility] 38.[facility] gestational [facility] Outcome: spontaneous vaginal delivery. Anesthesia: epidural. Postpartum course has been uncomplicated. Baby's course has been uncomplicated. Baby is feeding by bottle - Similac Advance. Bleeding no bleeding. Bowel function is normal. Bladder function is normal. Patient is not sexually active. Contraception method is abstinence. Postpartum depression screening: negative.     Review of Systems Pertinent items are noted in HPI.   Objective:    BP 124/69   Pulse 73   Ht 4\' 11"  (1.499 m)   Wt 108 lb 12.8 oz (49.4 kg)   Breastfeeding? No   BMI 21.97 kg/m   General:  alert, cooperative and no distress   Breasts:  inspection negative, no nipple discharge or bleeding, no masses or nodularity palpable  Lungs: clear to auscultation bilaterally  Heart:  regular rate and rhythm  Abdomen: soft, non-tender; bowel sounds normal; no masses,  no organomegaly   Vulva:  normal  Vagina: normal vagina, no discharge, exudate, lesion, or erythema  Cervix:  multiparous appearance  Corpus: normal size, contour, position, consistency, mobility, non-tender  Adnexa:  normal adnexa and no mass, fullness, tenderness  Rectal Exam: Not performed.        Assessment:     Normal postpartum exam. Pap smear not done at today's visit.   Plan:    1. Contraception: OCP (estrogen/progesterone) Rx Loestrin provided. 2. Patient is medically cleared to resume all activities of daily living. Patient is normotensive and has been off Norvasc for 1 week. No need for further interventions 3. Follow up in: 6 months for annual exam or as needed.

## 2017-04-17 NOTE — Progress Notes (Deleted)
Post Partum Exam  Sue Graves is a 21 y.o. [id number] female who presents for a postpartum visit. She is 4 weeks postpartum following a spontaneous vaginal delivery. I have fully reviewed the prenatal and intrapartum course. The delivery was at 39 gestational weeks.  Anesthesia: epidural. Postpartum course has been good. Baby's course has been good. Baby is feeding by bottle - Similac Advance. Bleeding no bleeding. Bowel function is normal. Bladder function is normal. Patient is not sexually active. Contraception method is none. Postpartum depression screening:neg  {Common ambulatory SmartLinks:19316}  Review of Systems {ros; complete:30496}    Objective:  unknown if currently breastfeeding.  General:  {gen appearance:16600}   Breasts:  {breast exam:1202::"inspection negative, no nipple discharge or bleeding, no masses or nodularity palpable"}  Lungs: {lung exam:16931}  Heart:  {heart exam:5510}  Abdomen: {abdomen exam:16834}   Vulva:  {labia exam:12198}  Vagina: {vagina exam:12200}  Cervix:  {cervix exam:14595}  Corpus: {uterus exam:12215}  Adnexa:  {adnexa exam:12223}  Rectal Exam: {rectal/vaginal exam:12274}        Assessment:    *** postpartum exam. Pap smear {done:10129} at today's visit.   Plan:   1. Contraception: {method:5051} 2. *** 3. Follow up in: {1-10:13787} {time; units:19136} or as needed.

## 2018-03-31 ENCOUNTER — Ambulatory Visit (HOSPITAL_COMMUNITY)
Admission: EM | Admit: 2018-03-31 | Discharge: 2018-03-31 | Disposition: A | Discharge status: Home / Self Care | Payer: Medicaid Other | Attending: Family Medicine | Admitting: Family Medicine

## 2018-03-31 ENCOUNTER — Encounter (HOSPITAL_COMMUNITY): Payer: Self-pay

## 2018-03-31 DIAGNOSIS — J020 Streptococcal pharyngitis: Secondary | ICD-10-CM

## 2018-03-31 LAB — POCT RAPID STREP A: Streptococcus, Group A Screen (Direct): POSITIVE — AB

## 2018-03-31 MED ORDER — ACETAMINOPHEN 325 MG PO TABS
650.0000 mg | ORAL_TABLET | Freq: Once | ORAL | Status: AC
  Administered 2018-03-31: 650 mg via ORAL

## 2018-03-31 MED ORDER — AMOXICILLIN 875 MG PO TABS: 875.0000 mg | ORAL_TABLET | Freq: Two times a day (BID) | ORAL | 0 refills | Status: AC

## 2018-03-31 MED ORDER — ACETAMINOPHEN 325 MG PO TABS
ORAL_TABLET | ORAL | Status: AC
  Filled 2018-03-31: qty 2

## 2018-03-31 NOTE — ED Provider Notes (Signed)
[facility]   [id number] 03/31/18 Arrival Time: 1337  ASSESSMENT & PLAN:  1. Strep pharyngitis    Meds ordered this encounter  Medications  . acetaminophen (TYLENOL) tablet 650 mg  . amoxicillin (AMOXIL) 875 MG tablet    Sig: Take 1 tablet (875 mg total) by mouth 2 (two) times daily for 10 days.    Dispense:  20 tablet    Refill:  0    Labs Reviewed  POCT RAPID STREP A - Abnormal; Notable for the following components:      Result Value   Streptococcus, Group A Screen (Direct) POSITIVE (*)    All other components within normal limits    OTC analgesics and throat care as needed  Discharge Instructions      You may use over the counter ibuprofen or acetaminophen as needed. Start and finish all of your antibiotic.  For a sore throat, over the counter products such as [employer] Peroxyl Mouth Sore Rinse or Chloraseptic Sore Throat Spray may provide some temporary relief.  Reviewed expectations re: course of current medical issues. Questions answered. Outlined signs and symptoms indicating need for more acute intervention. Patient verbalized understanding. After Visit Summary given.   SUBJECTIVE:  Sue Graves is a 22 y.o. female who reports a sore throat. Describes as pain with swallowing. Onset abrupt beginning a few days ago. No respiratory symptoms. Normal PO intake but reports discomfort with swallowing. Fever reported: yes, subjective with chills. No associated n/v/abdominal symptoms. Sick contacts: none.  OTC treatment: Tylenol with mild help.  ROS: As per HPI.   OBJECTIVE:  Vitals:   03/31/18 1351  BP: 130/80  Pulse: (!) 109  Resp: 20  Temp: (!) 101.4 F (38.6 C)  TempSrc: Oral  SpO2: 100%     General appearance: alert; no distress HEENT: throat with moderate erythema and exudates present; uvula midline yes Neck: supple with FROM; small cervical LAD bilaterally Lungs: clear to auscultation bilaterally Skin: reveals no rash; warm and  dry Psychological: alert and cooperative; normal mood and affect  Allergies  Allergen Reactions  . Other Itching    Scented soaps/body wash  . Tomato Hives and Swelling    Past Medical History:  Diagnosis Date  . Asthma    as a child , no inhaler  . Depression    history - no meds  . Missed abortion    no surgery required   Social History   Socioeconomic History  . Marital status: Single    Spouse name: Not on file  . Number of children: Not on file  . Years of education: Not on file  . Highest education level: Not on file  Occupational History  . Not on file  Social Needs  . Financial resource strain: Not on file  . Food insecurity:    Worry: Not on file    Inability: Not on file  . Transportation needs:    Medical: Not on file    Non-medical: Not on file  Tobacco Use  . Smoking status: Former Smoker    Packs/day: 0.25    Years: 3.00    Pack years: 0.75    Types: Cigarettes    Last attempt to quit: 12/17/2016    Years since quitting: 1.2  . Smokeless tobacco: Never Used  Substance and Sexual Activity  . Alcohol use: No    Comment: occasionally   . Drug use: No    Types: Marijuana    Comment: last use 04/24/16  . Sexual activity:  Yes    Birth control/protection: None    Comment: approx [redacted] wks gestation  Lifestyle  . Physical activity:    Days per week: Not on file    Minutes per session: Not on file  . Stress: Not on file  Relationships  . Social connections:    Talks on phone: Not on file    Gets together: Not on file    Attends religious service: Not on file    Active member of club or organization: Not on file    Attends meetings of clubs or organizations: Not on file    Relationship status: Not on file  . Intimate partner violence:    Fear of current or ex partner: Not on file    Emotionally abused: Not on file    Physically abused: Not on file    Forced sexual activity: Not on file  Other Topics Concern  . Not on file  Social History  Narrative  . Not on file   Family History  Problem Relation Age of Onset  . Diabetes Maternal Renato GailsAunt           Shaunika Italiano, MD 04/01/18 1041

## 2018-03-31 NOTE — Discharge Instructions (Signed)
You may use over the counter ibuprofen or acetaminophen as needed. Start and finish all of your antibiotic. For a sore throat, over the counter products such as [employer] Peroxyl Mouth Sore Rinse or Chloraseptic Sore Throat Spray may provide some temporary relief.

## 2018-03-31 NOTE — ED Triage Notes (Signed)
Pt presents with sore throat, body aches, headache and coughing

## 2018-06-11 ENCOUNTER — Encounter (HOSPITAL_BASED_OUTPATIENT_CLINIC_OR_DEPARTMENT_OTHER): Payer: Self-pay | Admitting: Emergency Medicine

## 2018-06-11 ENCOUNTER — Other Ambulatory Visit: Payer: Self-pay

## 2018-06-11 ENCOUNTER — Emergency Department (HOSPITAL_BASED_OUTPATIENT_CLINIC_OR_DEPARTMENT_OTHER)
Admission: EM | Admit: 2018-06-11 | Discharge: 2018-06-11 | Disposition: A | Discharge status: Home / Self Care | Payer: Medicaid Other | Attending: Emergency Medicine | Admitting: Emergency Medicine

## 2018-06-11 ENCOUNTER — Emergency Department (HOSPITAL_BASED_OUTPATIENT_CLINIC_OR_DEPARTMENT_OTHER): Payer: Medicaid Other

## 2018-06-11 DIAGNOSIS — Z87891 Personal history of nicotine dependence: Secondary | ICD-10-CM | POA: Diagnosis not present

## 2018-06-11 DIAGNOSIS — Y92410 Unspecified street and highway as the place of occurrence of the external cause: Secondary | ICD-10-CM | POA: Diagnosis not present

## 2018-06-11 DIAGNOSIS — S39012A Strain of muscle, fascia and tendon of lower back, initial encounter: Secondary | ICD-10-CM | POA: Diagnosis not present

## 2018-06-11 DIAGNOSIS — J45909 Unspecified asthma, uncomplicated: Secondary | ICD-10-CM | POA: Diagnosis not present

## 2018-06-11 DIAGNOSIS — Y939 Activity, unspecified: Secondary | ICD-10-CM | POA: Diagnosis not present

## 2018-06-11 DIAGNOSIS — Y999 Unspecified external cause status: Secondary | ICD-10-CM | POA: Diagnosis not present

## 2018-06-11 DIAGNOSIS — S3992XA Unspecified injury of lower back, initial encounter: Secondary | ICD-10-CM | POA: Diagnosis present

## 2018-06-11 MED ORDER — NAPROXEN 500 MG PO TABS: 500.0000 mg | ORAL_TABLET | Freq: Two times a day (BID) | ORAL | 1 refills | Status: DC

## 2018-06-11 NOTE — Discharge Instructions (Addendum)
X-rays here of the lumbar back without any bony injuries.  Take the Naprosyn as directed.  Work note provided.  Follow-up if not improving over the next week or so.  Return for any new or worse symptoms.

## 2018-06-11 NOTE — ED Notes (Signed)
ED Provider at bedside. 
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
## 2018-07-28 ENCOUNTER — Inpatient Hospital Stay (HOSPITAL_COMMUNITY)
Admission: AD | Admit: 2018-07-28 | Discharge: 2018-07-28 | Disposition: A | Discharge status: Home / Self Care | Payer: Medicaid Other | Attending: Family Medicine | Admitting: Family Medicine

## 2018-07-28 ENCOUNTER — Encounter (HOSPITAL_COMMUNITY): Payer: Self-pay | Admitting: *Deleted

## 2018-07-28 DIAGNOSIS — N3001 Acute cystitis with hematuria: Secondary | ICD-10-CM | POA: Insufficient documentation

## 2018-07-28 DIAGNOSIS — Z3202 Encounter for pregnancy test, result negative: Secondary | ICD-10-CM | POA: Insufficient documentation

## 2018-07-28 DIAGNOSIS — Z87891 Personal history of nicotine dependence: Secondary | ICD-10-CM | POA: Diagnosis not present

## 2018-07-28 DIAGNOSIS — R300 Dysuria: Secondary | ICD-10-CM | POA: Diagnosis present

## 2018-07-28 LAB — URINALYSIS, ROUTINE W REFLEX MICROSCOPIC
Bilirubin Urine: NEGATIVE
Glucose, UA: NEGATIVE mg/dL
KETONES UR: NEGATIVE mg/dL
Nitrite: NEGATIVE
PH: 7 (ref 5.0–8.0)
PROTEIN: NEGATIVE mg/dL
Specific Gravity, Urine: 1.002 — ABNORMAL LOW (ref 1.005–1.030)

## 2018-07-28 LAB — POCT PREGNANCY, URINE: Preg Test, Ur: NEGATIVE

## 2018-07-28 MED ORDER — SULFAMETHOXAZOLE-TRIMETHOPRIM 800-160 MG PO TABS: 1.0000 | ORAL_TABLET | Freq: Two times a day (BID) | ORAL | 0 refills | Status: DC
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
## 2018-07-28 NOTE — MAU Provider Note (Signed)
Chief Complaint: Dysuria   First Provider Initiated Contact with Patient 07/28/18 2141     SUBJECTIVE HPI: Sue Graves is a 22 y.o. none pregnant female who presents to Maternity Admissions reporting dysuria & urgency. Symptoms began 2 days ago. Feels similar to previous UTIs. Symptoms occur with every void. Denies fever/chills, flank pain, n/v, vaginal discharge, or hematuria.    Past Medical History:  Diagnosis Date  . Asthma    as a child , no inhaler  . Depression    history - no meds  . Missed abortion    no surgery required   OB History  Gravida Para Term Preterm AB Living  3 1 1   2 1   SAB TAB Ectopic Multiple Live Births  2     0 1    # Outcome Date GA Lbr Len/2nd Weight Sex Delivery Anes PTL Lv  3 Term 03/19/17 [redacted]w[redacted]d 03:39 / 00:42 3036 g M Vag-Spont EPI  LIV  2 SAB 04/2016 [redacted]w[redacted]d         1 SAB 03/2016 [redacted]w[redacted]d          Past Surgical History:  Procedure Laterality Date  . DILATION AND EVACUATION N/A 05/01/2016   Procedure: DILATATION AND EVACUATION for Retained Product;  Surgeon: Tereso Newcomer, MD;  Location: [facility];  Service: Gynecology;  Laterality: N/A;  . WISDOM TOOTH EXTRACTION     Social History   Socioeconomic History  . Marital status: Single    Spouse name: Not on file  . Number of children: Not on file  . Years of education: Not on file  . Highest education level: Not on file  Occupational History  . Not on file  Social Needs  . Financial resource strain: Not on file  . Food insecurity:    Worry: Not on file    Inability: Not on file  . Transportation needs:    Medical: Not on file    Non-medical: Not on file  Tobacco Use  . Smoking status: Former Smoker    Packs/day: 0.25    Years: 3.00    Pack years: 0.75    Types: Cigarettes    Last attempt to quit: 12/17/2016    Years since quitting: 1.6  . Smokeless tobacco: Never Used  Substance and Sexual Activity  . Alcohol use: Yes    Comment: occasionally   . Drug use: No    Types: Marijuana   Comment: last use 04/24/16  . Sexual activity: Yes    Birth control/protection: None  Lifestyle  . Physical activity:    Days per week: Not on file    Minutes per session: Not on file  . Stress: Not on file  Relationships  . Social connections:    Talks on phone: Not on file    Gets together: Not on file    Attends religious service: Not on file    Active member of club or organization: Not on file    Attends meetings of clubs or organizations: Not on file    Relationship status: Not on file  . Intimate partner violence:    Fear of current or ex partner: Not on file    Emotionally abused: Not on file    Physically abused: Not on file    Forced sexual activity: Not on file  Other Topics Concern  . Not on file  Social History Narrative  . Not on file   Family History  Problem Relation Age of Onset  . Diabetes Maternal  Aunt    No current facility-administered medications on file prior to encounter.    Current Outpatient Medications on File Prior to Encounter  Medication Sig Dispense Refill  . naproxen (NAPROSYN) 500 MG tablet Take 1 tablet (500 mg total) by mouth 2 (two) times daily. 14 tablet 1   Allergies  Allergen Reactions  . Other Itching    Scented soaps/body wash  . Tomato Hives and Swelling    I have reviewed patient's Past Medical Hx, Surgical Hx, Family Hx, Social Hx, medications and allergies.   Review of Systems  Constitutional: Negative.   Gastrointestinal: Negative.   Genitourinary: Positive for dysuria, frequency and urgency. Negative for flank pain, genital sores, hematuria, vaginal bleeding and vaginal discharge.    OBJECTIVE Patient Vitals for the past 24 hrs:  BP Temp Temp src Pulse Resp Height Weight  07/28/18 2105 119/68 98.5 F (36.9 C) Oral 90 20 4\' 11"  (1.499 m) 51.1 kg   Constitutional: Well-developed, well-nourished female in no acute distress.  Respiratory: normal rate and effort.  GI: Abd soft, non-tender, Pos BS x 4. No guarding or  rebound tenderness. No CVAT MS: Extremities nontender, no edema, normal ROM Neurologic: Alert and oriented x 4.      LAB RESULTS Results for orders placed or performed during the hospital encounter of 07/28/18 (from the past 24 hour(s))  Urinalysis, Routine w reflex microscopic     Status: Abnormal   Collection Time: 07/28/18  9:11 PM  Result Value Ref Range   Color, Urine COLORLESS (A) YELLOW   APPearance CLEAR CLEAR   Specific Gravity, Urine 1.002 (L) 1.005 - 1.030   pH 7.0 5.0 - 8.0   Glucose, UA NEGATIVE NEGATIVE mg/dL   Hgb urine dipstick SMALL (A) NEGATIVE   Bilirubin Urine NEGATIVE NEGATIVE   Ketones, ur NEGATIVE NEGATIVE mg/dL   Protein, ur NEGATIVE NEGATIVE mg/dL   Nitrite NEGATIVE NEGATIVE   Leukocytes, UA MODERATE (A) NEGATIVE   RBC / HPF 0-5 0 - 5 RBC/hpf   WBC, UA 21-50 0 - 5 WBC/hpf   Bacteria, UA RARE (A) NONE SEEN   Squamous Epithelial / LPF 0-5 0 - 5  Pregnancy, urine POC     Status: None   Collection Time: 07/28/18  9:14 PM  Result Value Ref Range   Preg Test, Ur NEGATIVE NEGATIVE    IMAGING No results found.  MAU COURSE Orders Placed This Encounter  Procedures  . Urinalysis, Routine w reflex microscopic  . Pregnancy, urine POC  . Discharge patient   Meds ordered this encounter  Medications  . sulfamethoxazole-trimethoprim (BACTRIM DS,SEPTRA DS) 800-160 MG tablet    Sig: Take 1 tablet by mouth 2 (two) times daily.    Dispense:  6 tablet    Refill:  0    Order Specific Question:   Supervising Provider    Answer:   Levie Heritage [[id number]]    MDM UPT negative Pt afebrile. No CVAT. U/a suspicious for UTI, will tx as patient is symptomatic  ASSESSMENT 1. Acute cystitis with hematuria     PLAN Discharge home in stable condition. Rx bactrim Can take AZO otc Increase water intake. Avoid caffeine & sugary drinks Discussed reasons to f/u with urgent care or ED (n/v, flank pain, fever/chills)  Allergies as of 07/28/2018      Reactions   Other  Itching   Scented soaps/body wash   Tomato Hives, Swelling      Medication List    STOP taking these medications  naproxen 500 MG tablet Commonly known as:  NAPROSYN     TAKE these medications   sulfamethoxazole-trimethoprim 800-160 MG tablet Commonly known as:  BACTRIM DS,SEPTRA DS Take 1 tablet by mouth 2 (two) times daily.        Judeth HornLawrence, Jossette Zirbel, NP 07/28/2018  9:54 PM

## 2018-07-28 NOTE — Discharge Instructions (Signed)

## 2018-08-19 ENCOUNTER — Encounter (HOSPITAL_COMMUNITY): Payer: Self-pay | Admitting: *Deleted

## 2018-08-19 ENCOUNTER — Emergency Department (HOSPITAL_COMMUNITY)
Admission: EM | Admit: 2018-08-19 | Discharge: 2018-08-19 | Disposition: A | Discharge status: Home / Self Care | Payer: Medicaid Other | Attending: Emergency Medicine | Admitting: Emergency Medicine

## 2018-08-19 ENCOUNTER — Inpatient Hospital Stay (HOSPITAL_COMMUNITY)
Admission: AD | Admit: 2018-08-19 | Discharge: 2018-08-19 | Disposition: A | Discharge status: Home / Self Care | Payer: Medicaid Other | Source: Ambulatory Visit | Attending: Obstetrics and Gynecology | Admitting: Obstetrics and Gynecology

## 2018-08-19 ENCOUNTER — Other Ambulatory Visit: Payer: Self-pay

## 2018-08-19 ENCOUNTER — Encounter (HOSPITAL_COMMUNITY): Payer: Self-pay | Admitting: Emergency Medicine

## 2018-08-19 DIAGNOSIS — Z87891 Personal history of nicotine dependence: Secondary | ICD-10-CM | POA: Insufficient documentation

## 2018-08-19 DIAGNOSIS — O2310 Infections of bladder in pregnancy, unspecified trimester: Secondary | ICD-10-CM | POA: Diagnosis present

## 2018-08-19 DIAGNOSIS — J45909 Unspecified asthma, uncomplicated: Secondary | ICD-10-CM | POA: Diagnosis not present

## 2018-08-19 DIAGNOSIS — Z3201 Encounter for pregnancy test, result positive: Secondary | ICD-10-CM

## 2018-08-19 DIAGNOSIS — Z3A00 Weeks of gestation of pregnancy not specified: Secondary | ICD-10-CM | POA: Diagnosis not present

## 2018-08-19 DIAGNOSIS — Z79899 Other long term (current) drug therapy: Secondary | ICD-10-CM | POA: Insufficient documentation

## 2018-08-19 DIAGNOSIS — N3001 Acute cystitis with hematuria: Secondary | ICD-10-CM

## 2018-08-19 DIAGNOSIS — Z3491 Encounter for supervision of normal pregnancy, unspecified, first trimester: Secondary | ICD-10-CM

## 2018-08-19 LAB — URINALYSIS, ROUTINE W REFLEX MICROSCOPIC
Bilirubin Urine: NEGATIVE
Glucose, UA: NEGATIVE mg/dL
Hgb urine dipstick: NEGATIVE
Ketones, ur: 20 mg/dL — AB
Nitrite: NEGATIVE
Protein, ur: 30 mg/dL — AB
Specific Gravity, Urine: 1.025 (ref 1.005–1.030)
WBC, UA: >50 WBC/hpf — ABNORMAL HIGH (ref 0–5)
pH: 7 (ref 5.0–8.0)

## 2018-08-19 LAB — POC URINE PREG, ED: Preg Test, Ur: POSITIVE — AB

## 2018-08-19 MED ORDER — CEPHALEXIN 500 MG PO CAPS: 500.0000 mg | ORAL_CAPSULE | Freq: Two times a day (BID) | ORAL | 0 refills | Status: DC

## 2018-08-19 MED ORDER — PRENATAL COMPLETE 14-0.4 MG PO TABS: 1.0000 | ORAL_TABLET | Freq: Every day | ORAL | 0 refills | Status: DC

## 2018-08-19 NOTE — ED Provider Notes (Signed)
[facility] EMERGENCY DEPARTMENT Provider Note   CSN: [id number] Arrival date & time: 08/19/18  1155     History   Chief Complaint Chief Complaint  Patient presents with  . Possible Pregnancy    HPI Sue Graves is a 23 y.o. female.  HPI   23 year old female presents today with complaints of missed period.  Patient notes her last normal menstrual cycle was sometime in the middle of November, she is several weeks late for her menstruation.  Patient denies any associated abdominal pain, she notes some nausea with smells, denies any vomiting, vaginal bleeding or discharge.  She denies any urinary complaints.  Past Medical History:  Diagnosis Date  . Asthma    as a child , no inhaler  . Depression    history - no meds  . Missed abortion    no surgery required    Patient Active Problem List   Diagnosis Date Noted  . Tobacco abuse 09/18/2016    Past Surgical History:  Procedure Laterality Date  . DILATION AND EVACUATION N/A 05/01/2016   Procedure: DILATATION AND EVACUATION for Retained Product;  Surgeon: Tereso Newcomer, MD;  Location: [facility];  Service: Gynecology;  Laterality: N/A;  . WISDOM TOOTH EXTRACTION       OB History    Gravida  3   Para  1   Term  1   Preterm      AB  2   Living  1     SAB  2   TAB      Ectopic      Multiple  0   Live Births  1            Home Medications    Prior to Admission medications   Medication Sig Start Date End Date Taking? Authorizing Provider  cephALEXin (KEFLEX) 500 MG capsule Take 1 capsule (500 mg total) by mouth 2 (two) times daily. 08/19/18   Rachana Malesky, Tinnie Gens, PA-C  Prenatal Vit-Fe Fumarate-FA (PRENATAL COMPLETE) 14-0.4 MG TABS Take 1 tablet by mouth daily. 08/19/18   Micaiah Remillard, Tinnie Gens, PA-C  sulfamethoxazole-trimethoprim (BACTRIM DS,SEPTRA DS) 800-160 MG tablet Take 1 tablet by mouth 2 (two) times daily. 07/28/18   Judeth Horn, NP    Family History Family History  Problem Relation  Age of Onset  . Diabetes Maternal Aunt     Social History Social History   Tobacco Use  . Smoking status: Former Smoker    Packs/day: 0.25    Years: 3.00    Pack years: 0.75    Types: Cigarettes    Last attempt to quit: 12/17/2016    Years since quitting: 1.6  . Smokeless tobacco: Never Used  Substance Use Topics  . Alcohol use: Yes    Comment: occasionally   . Drug use: No    Types: Marijuana    Comment: last use 04/24/16     Allergies   Other and Tomato   Review of Systems Review of Systems  All other systems reviewed and are negative.    Physical Exam Updated Vital Signs BP 125/75 (BP Location: Right Arm)   Pulse 80   Temp 98 F (36.7 C) (Oral)   Resp 18   LMP 07/08/2018   SpO2 100%   Physical Exam Vitals signs and nursing note reviewed.  Constitutional:      Appearance: She is well-developed.  HENT:     Head: Normocephalic and atraumatic.  Eyes:     General: No scleral icterus.  Right eye: No discharge.        Left eye: No discharge.     Conjunctiva/sclera: Conjunctivae normal.     Pupils: Pupils are equal, round, and reactive to light.  Neck:     Musculoskeletal: Normal range of motion.     Vascular: No JVD.     Trachea: No tracheal deviation.  Pulmonary:     Effort: Pulmonary effort is normal.     Breath sounds: No stridor.  Abdominal:     Comments: Nongravid abdomen nontender  Neurological:     Mental Status: She is alert and oriented to person, place, and time.     Coordination: Coordination normal.  Psychiatric:        Behavior: Behavior normal.        Thought Content: Thought content normal.        Judgment: Judgment normal.      ED Treatments / Results  Labs (all labs ordered are listed, but only abnormal results are displayed) Labs Reviewed  URINALYSIS, ROUTINE W REFLEX MICROSCOPIC - Abnormal; Notable for the following components:      Result Value   APPearance CLOUDY (*)    Ketones, ur 20 (*)    Protein, ur 30 (*)     Leukocytes, UA LARGE (*)    WBC, UA >50 (*)    Bacteria, UA RARE (*)    All other components within normal limits  POC URINE PREG, ED    EKG None  Radiology No results found.  Procedures Procedures (including critical care time)  Medications Ordered in ED Medications - No data to display   Initial Impression / Assessment and Plan / ED Course  I have reviewed the triage vital signs and the nursing notes.  Pertinent labs & imaging results that were available during my care of the patient were reviewed by me and considered in my medical decision making (see chart for details).     Labs:   Imaging:  Consults:  Therapeutics:  Discharge Meds: Keflex, prenatals  Assessment/Plan: 23 year old female presents today with first trimester pregnancy.  She has no complicating features including bleeding discharge or abdominal pain.  She does have urine consistent with urinary tract infection.  Patient will be started on antibiotics encouraged follow-up with OB/GYN.  Return precautions given.  She verbalized understanding and agreement to today's plan.    Final Clinical Impressions(s) / ED Diagnoses   Final diagnoses:  Acute cystitis with hematuria  First trimester pregnancy    ED Discharge Orders         Ordered    cephALEXin (KEFLEX) 500 MG capsule  2 times daily     08/19/18 1352    Prenatal Vit-Fe Fumarate-FA (PRENATAL COMPLETE) 14-0.4 MG TABS  Daily     08/19/18 1352           Eyvonne Mechanic, PA-C 08/19/18 1353    Rolan Bucco, MD 08/19/18 [phone]

## 2018-08-19 NOTE — ED Triage Notes (Signed)
Patient arrived from home reporting that she came to see if she was pregnant. She states that her home pregnancy test did not work and she has not had a period since November. Patient prefers not to talk about this with her children. Children at bedside

## 2018-08-19 NOTE — ED Notes (Signed)
D/c reviewed with patient 

## 2018-08-19 NOTE — MAU Note (Signed)
Pt presents to MAU stating she went to [facility] ED and was confimed pregnant with a urine sample and states she is in MAU to confirm and find out how many weeks she is. No bleeding or abdominal pain.

## 2018-08-19 NOTE — ED Notes (Signed)
Patient ambulatory to bathroom with steady gait at this time to provide urine sample 

## 2018-08-19 NOTE — MAU Provider Note (Signed)
Sue Graves is a 23 y.o. [phone] who present to MAU today for pregnancy confirmation. She reports being seen at [facility] ED today who confirmed she was pregnant but presents to MAU to "confirm and find out how many weeks". She denies abdominal pain or vaginal bleeding.   BP 131/73 (BP Location: Right Arm)   Pulse 91   Temp 98.3 F (36.8 C) (Oral)   Resp 18   Wt 49.2 kg   LMP 06/29/2018 (LMP Unknown)   BMI 21.89 kg/m  CONSTITUTIONAL: Well-developed, well-nourished female in no acute distress.  CARDIOVASCULAR: Normal heart rate noted RESPIRATORY: Effort and breath sounds normal GASTROINTESTINAL:Soft, no distention noted.  No tenderness, rebound or guarding.  SKIN: Skin is warm and dry. No rash noted. Not diaphoretic. No erythema. No pallor. PSYCHIATRIC: Normal mood and affect. Normal behavior. Normal judgment and thought content.  MDM Medical screening exam complete Patient does not endorse any symptoms concerning for ectopic pregnancy or pregnancy related complication today.  Safe medications during pregnancy discussed  Encouraged to make initial prenatal appointment at [facility]  Discussed GA by LMP today and EDD of 04/05/2019  A:  Urine pregnancy test positive at [facility] ED   P: Discharge home Reasons to return to MAU reviewed  Patient may return to MAU as needed or if her condition were to change or worsen Encouraged to make initial prenatal appointment at [facility]  List given for safe medications during pregnancy   Sharyon Cable, CNM 08/19/2018 8:51 PM

## 2018-08-19 NOTE — Discharge Instructions (Addendum)
Please read attached information. If you experience any new or worsening signs or symptoms please return to the emergency room for evaluation. Please follow-up with your primary care provider or specialist as discussed. Please use medication prescribed only as directed and discontinue taking if you have any concerning signs or symptoms.   °

## 2018-08-19 NOTE — Discharge Instructions (Signed)

## 2018-08-19 NOTE — L&D Delivery Note (Signed)
OB/GYN Faculty Practice Delivery Note  Sue Graves is a 23 y.o. [id number] s/p NSVD at [redacted]w[redacted]d. She was admitted for SROM.   ROM: 4h 7m with meconium-stained fluid GBS Status: positive Maximum Maternal Temperature: 98.7  Labor Progress: . Patient presented to labor and delivery for SROM with meconium. She received an epidural, and labor progressed without augmentation. She received one does of ampicillin.  Delivery Date/Time: 04/13/19 at 1004 Delivery: Called to room and patient was complete and pushing. Head delivered LOA. Nuchal x2 present, and both were manually reduced. Shoulder and body delivered in usual fashion. Infant with spontaneous cry, placed on mother's abdomen, dried and stimulated. Cord clamped x 2 after 1-minute delay, and cut by the baby's aunt under my direct supervision. A true knot in the cord was noted. Cord blood drawn. Placenta delivered spontaneously with gentle cord traction. Fundus firm with massage and Pitocin. Labia, perineum, vagina, and cervix inspected inspected and found to be intact with small perineal abrasions that were hemostatic.   For her history of PPH, TXA was given right after delivery.  Placenta: intact Complications: none Lacerations: small perineal abrasions EBL: 25 mL Analgesia: epidural  Postpartum Planning [x]  message to sent to schedule follow-up  [x]  vaccines UTD  Infant: female  APGARs 9, 9  weight as per medical record  Merilyn Baba, DO OB/GYN Fellow, Faculty Practice

## 2018-08-24 ENCOUNTER — Emergency Department (HOSPITAL_COMMUNITY)
Admission: EM | Admit: 2018-08-24 | Discharge: 2018-08-24 | Disposition: A | Discharge status: Home / Self Care | Payer: Medicaid Other | Attending: Emergency Medicine | Admitting: Emergency Medicine

## 2018-08-24 ENCOUNTER — Encounter (HOSPITAL_COMMUNITY): Payer: Self-pay | Admitting: *Deleted

## 2018-08-24 ENCOUNTER — Other Ambulatory Visit: Payer: Self-pay

## 2018-08-24 ENCOUNTER — Encounter (HOSPITAL_COMMUNITY): Payer: Self-pay | Admitting: Emergency Medicine

## 2018-08-24 ENCOUNTER — Inpatient Hospital Stay (HOSPITAL_COMMUNITY)
Admission: AD | Admit: 2018-08-24 | Discharge: 2018-08-25 | Disposition: A | Discharge status: Home / Self Care | Payer: Medicaid Other | Attending: Obstetrics and Gynecology | Admitting: Obstetrics and Gynecology

## 2018-08-24 DIAGNOSIS — A599 Trichomoniasis, unspecified: Secondary | ICD-10-CM | POA: Diagnosis not present

## 2018-08-24 DIAGNOSIS — Z3A08 8 weeks gestation of pregnancy: Secondary | ICD-10-CM | POA: Diagnosis not present

## 2018-08-24 DIAGNOSIS — Z5321 Procedure and treatment not carried out due to patient leaving prior to being seen by health care provider: Secondary | ICD-10-CM | POA: Diagnosis not present

## 2018-08-24 DIAGNOSIS — O210 Mild hyperemesis gravidarum: Secondary | ICD-10-CM | POA: Diagnosis present

## 2018-08-24 DIAGNOSIS — A5901 Trichomonal vulvovaginitis: Secondary | ICD-10-CM | POA: Diagnosis not present

## 2018-08-24 DIAGNOSIS — R1110 Vomiting, unspecified: Secondary | ICD-10-CM | POA: Diagnosis present

## 2018-08-24 DIAGNOSIS — O98311 Other infections with a predominantly sexual mode of transmission complicating pregnancy, first trimester: Secondary | ICD-10-CM | POA: Diagnosis not present

## 2018-08-24 DIAGNOSIS — Z87891 Personal history of nicotine dependence: Secondary | ICD-10-CM | POA: Diagnosis not present

## 2018-08-24 HISTORY — DX: Unspecified maternal hypertension, unspecified trimester: O16.9

## 2018-08-24 LAB — COMPREHENSIVE METABOLIC PANEL
ALT: 16 U/L (ref 0–44)
AST: 20 U/L (ref 15–41)
Albumin: 4.8 g/dL (ref 3.5–5.0)
Alkaline Phosphatase: 74 U/L (ref 38–126)
Anion gap: 13 (ref 5–15)
BUN: 9 mg/dL (ref 6–20)
CO2: 24 mmol/L (ref 22–32)
Calcium: 9.9 mg/dL (ref 8.9–10.3)
Chloride: 96 mmol/L — ABNORMAL LOW (ref 98–111)
Creatinine, Ser: 0.72 mg/dL (ref 0.44–1.00)
GFR calc Af Amer: >60 mL/min (ref >60)
GFR calc non Af Amer: >60 mL/min (ref >60)
GLUCOSE: 97 mg/dL (ref 70–99)
Potassium: 3 mmol/L — ABNORMAL LOW (ref 3.5–5.1)
Sodium: 133 mmol/L — ABNORMAL LOW (ref 135–145)
Total Bilirubin: 0.7 mg/dL (ref 0.3–1.2)
Total Protein: 8.5 g/dL — ABNORMAL HIGH (ref 6.5–8.1)

## 2018-08-24 LAB — LIPASE, BLOOD: Lipase: 32 U/L (ref 11–51)

## 2018-08-24 LAB — URINALYSIS, ROUTINE W REFLEX MICROSCOPIC
Bilirubin Urine: NEGATIVE
Glucose, UA: NEGATIVE mg/dL
Hgb urine dipstick: NEGATIVE
Ketones, ur: 20 mg/dL — AB
Nitrite: NEGATIVE
Protein, ur: 100 mg/dL — AB
Specific Gravity, Urine: 1.034 — ABNORMAL HIGH (ref 1.005–1.030)
WBC, UA: >50 WBC/hpf — ABNORMAL HIGH (ref 0–5)
pH: 6 (ref 5.0–8.0)

## 2018-08-24 LAB — CBC
HCT: 42.1 % (ref 36.0–46.0)
Hemoglobin: 14.5 g/dL (ref 12.0–15.0)
MCH: 31.3 pg (ref 26.0–34.0)
MCHC: 34.4 g/dL (ref 30.0–36.0)
MCV: 90.7 fL (ref 80.0–100.0)
Platelets: 357 10*3/uL (ref 150–400)
RBC: 4.64 MIL/uL (ref 3.87–5.11)
RDW: 12.5 % (ref 11.5–15.5)
WBC: 8.3 10*3/uL (ref 4.0–10.5)
nRBC: 0 % (ref 0.0–0.2)

## 2018-08-24 MED ORDER — SODIUM CHLORIDE 0.9 % IV SOLN
8.0000 mg | Freq: Once | INTRAVENOUS | Status: AC
  Administered 2018-08-24: 8 mg via INTRAVENOUS
  Filled 2018-08-24: qty 4

## 2018-08-24 MED ORDER — LACTATED RINGERS IV BOLUS
1000.0000 mL | Freq: Once | INTRAVENOUS | Status: AC
  Administered 2018-08-24: 1000 mL via INTRAVENOUS

## 2018-08-24 NOTE — ED Notes (Signed)
Pt called x1 for vitals reassessment. No answer. 

## 2018-08-24 NOTE — ED Triage Notes (Signed)
Pt presents 2 months pregnant with complaints of vomiting and unable to hold down PO x2 weeks. Patient states that she has generalized mid abdominal pain. Patient states when she was seen in beginning of January she did not mention it then stating she thought it was just from being pregnant.

## 2018-08-24 NOTE — MAU Note (Signed)
Pt reports to MAU from [facility]. Pt states the wait was too long. Pt states they said her BP was high at [facility]. Pt denies blurry vision, occasional HA, dizziness and lightheadedness. Pt reports not being able to keep fluids or food down. No bleeding pt reports having a constant clear fluid coming out of her vagina that has a foul odor. No bleeding.

## 2018-08-24 NOTE — MAU Note (Signed)
Pt tearful throughout assessment. States she has no support here in [place] and only support is mother who lives in [place]. Considering an abortion which she has an appt for on 10 Jan at [facility].  She states she has been vomiting repeatedly all day and has a clear discharge with a bad odor. Struggling with caring for her 23 yr old while so sick. Offered a Child psychotherapist to call her and assist with resources to which she declined.

## 2018-08-25 ENCOUNTER — Other Ambulatory Visit: Payer: Self-pay | Admitting: Obstetrics and Gynecology

## 2018-08-25 DIAGNOSIS — Z3A08 8 weeks gestation of pregnancy: Secondary | ICD-10-CM

## 2018-08-25 DIAGNOSIS — O210 Mild hyperemesis gravidarum: Secondary | ICD-10-CM

## 2018-08-25 DIAGNOSIS — A599 Trichomoniasis, unspecified: Secondary | ICD-10-CM | POA: Diagnosis present

## 2018-08-25 LAB — WET PREP, GENITAL
Clue Cells Wet Prep HPF POC: NONE SEEN
Sperm: NONE SEEN
Yeast Wet Prep HPF POC: NONE SEEN

## 2018-08-25 MED ORDER — ONDANSETRON 4 MG PO TBDP: 4.0000 mg | ORAL_TABLET | Freq: Three times a day (TID) | ORAL | 1 refills | Status: DC | PRN

## 2018-08-25 MED ORDER — METRONIDAZOLE 500 MG PO TABS: 2000.0000 mg | ORAL_TABLET | Freq: Once | ORAL | 0 refills | Status: AC

## 2018-08-25 NOTE — Progress Notes (Signed)
TC to notify patient of (+) trich results on [facility]. Advised to p/u Rx for Flagyl and take all as one dose. Advised to take Zofran that was Rx'd. Advised that partner will need to be treated as well. Encouraged to abstain from sex with partner until tx has been confirmed. Advised to notify [facility] of dx and tx to see if they need to delay her scheduled procedure. Patient verbalized an understanding of the plan of care and agrees.   Raelyn Moraolitta Anamaria Dusenbury, CNM

## 2018-08-25 NOTE — Discharge Instructions (Signed)

## 2018-08-25 NOTE — MAU Provider Note (Signed)
History     CSN: [id number]  Arrival date and time: 08/24/18 2218   First Provider Initiated Contact with Patient 08/25/18 0101      Chief Complaint  Patient presents with  . Nausea  . Dehydration  . Vaginal Discharge   HPI  Ms.  Sue Graves is a 23 y.o. year old [id number] female at [redacted]w[redacted]d weeks gestation who presents to MAU reporting N/V, H/A, dizziness, light-headedness, and clear vaginal d/c with a foul odor. She reports that she is unable to keep down food or fluids. She reports throwing up about 12 times today. She states that she is scheduled for an EAB on 08/28/2018 at [facility]. She has not taken anything for N/V. She denies VB.  Past Medical History:  Diagnosis Date  . Asthma    as a child , no inhaler  . Depression    history - no meds  . Hypertension during pregnancy   . Missed abortion    no surgery required    Past Surgical History:  Procedure Laterality Date  . DILATION AND EVACUATION N/A 05/01/2016   Procedure: DILATATION AND EVACUATION for Retained Product;  Surgeon: Tereso Newcomer, MD;  Location: [facility];  Service: Gynecology;  Laterality: N/A;  . WISDOM TOOTH EXTRACTION      Family History  Problem Relation Age of Onset  . Diabetes Maternal Aunt     Social History   Tobacco Use  . Smoking status: Former Smoker    Packs/day: 0.25    Years: 3.00    Pack years: 0.75    Types: Cigarettes    Last attempt to quit: 12/17/2016    Years since quitting: 1.6  . Smokeless tobacco: Never Used  Substance Use Topics  . Alcohol use: Not Currently    Comment: occasionally   . Drug use: No    Types: Marijuana    Comment: last use 02 Aug 2018    Allergies:  Allergies  Allergen Reactions  . Other Itching    Scented soaps/body wash  . Tomato Hives and Swelling    No medications prior to admission.    Review of Systems  Constitutional: Positive for appetite change and fatigue.  HENT: Negative.   Eyes: Negative.   Respiratory: Negative.    Cardiovascular: Negative.   Gastrointestinal: Positive for nausea and vomiting.  Endocrine: Negative.   Genitourinary: Negative.   Musculoskeletal: Negative.   Skin: Negative.   Allergic/Immunologic: Negative.   Neurological: Positive for dizziness, weakness, light-headedness and headaches.  Hematological: Negative.   Psychiatric/Behavioral: Negative.    Physical Exam   Blood pressure (!) 155/77, pulse (!) 102, temperature 98.8 F (37.1 C), temperature source Oral, resp. rate 17, last menstrual period 06/29/2018  Physical Exam  Nursing note and vitals reviewed. Constitutional: She is oriented to person, place, and time. She appears well-developed and well-nourished.  HENT:  Head: Normocephalic and atraumatic.  Eyes: Pupils are equal, round, and reactive to light.  Neck: Normal range of motion.  Cardiovascular: Normal rate.  Respiratory: Effort normal.  GI: Soft. Bowel sounds are decreased.  Genitourinary:    Genitourinary Comments: GC/CT and Wet prep done by RN just prior to pt being d/c'd home -- pt does not want to wait for results   Musculoskeletal: Normal range of motion.  Neurological: She is alert and oriented to person, place, and time.  Skin: Skin is warm and dry.  Psychiatric: She has a normal mood and affect. Her behavior is normal. Judgment and thought content  normal.    MAU Course  Procedures  MDM Wet Prep Intravenous fluids were administered, lactated Ringer's 1000 ml's.  Zofran 8 mg IVPB -- no N/V  Po Challenge -- tolerated ice and gingerale  Results for orders placed or performed during the hospital encounter of 08/24/18 (from the past 24 hour(s))  Urinalysis, Routine w reflex microscopic     Status: Abnormal   Collection Time: 08/24/18 10:41 PM  Result Value Ref Range   Color, Urine AMBER (A) YELLOW   APPearance HAZY (A) CLEAR   Specific Gravity, Urine 1.034 (H) 1.005 - 1.030   pH 6.0 5.0 - 8.0   Glucose, UA NEGATIVE NEGATIVE mg/dL   Hgb urine  dipstick NEGATIVE NEGATIVE   Bilirubin Urine NEGATIVE NEGATIVE   Ketones, ur 20 (A) NEGATIVE mg/dL   Protein, ur 456 (A) NEGATIVE mg/dL   Nitrite NEGATIVE NEGATIVE   Leukocytes, UA MODERATE (A) NEGATIVE   RBC / HPF 6-10 0 - 5 RBC/hpf   WBC, UA >50 (H) 0 - 5 WBC/hpf   Bacteria, UA RARE (A) NONE SEEN   Squamous Epithelial / LPF 0-5 0 - 5   Mucus PRESENT   Wet prep, genital     Status: Abnormal   Collection Time: 08/25/18  1:15 AM  Result Value Ref Range   Yeast Wet Prep HPF POC NONE SEEN NONE SEEN   Trich, Wet Prep PRESENT (A) NONE SEEN   Clue Cells Wet Prep HPF POC NONE SEEN NONE SEEN   WBC, Wet Prep HPF POC MODERATE (A) NONE SEEN   Sperm NONE SEEN      Assessment and Plan  Morning sickness  - Rx for Zofran 4 mg ODT every 8 hrs prn N/V - Information provided on morning sickness   Trichimoniasis - Will call patient to notify of (+) trich on [facility] - Rx for Flagyl 2000 mg one time; advise to take 30 mins after Zofran - Advised to notify partner of (+) trich results and the need for tx; abstain from sex until partner has been treated - Discharge patient - Patient verbalized an understanding of the plan of care and agrees.   Raelyn Mora, MSN, CNM 08/25/2018, 1:02 AM

## 2018-08-27 ENCOUNTER — Telehealth: Payer: Self-pay | Admitting: Advanced Practice Midwife

## 2018-08-27 MED ORDER — PROMETHAZINE HCL 25 MG PO TABS: 12.5000 mg | ORAL_TABLET | Freq: Four times a day (QID) | ORAL | 2 refills | Status: DC | PRN

## 2018-08-27 MED ORDER — METRONIDAZOLE 500 MG PO TABS: 500.0000 mg | ORAL_TABLET | Freq: Two times a day (BID) | ORAL | 0 refills | Status: AC

## 2018-08-27 NOTE — Telephone Encounter (Signed)
Pt called to report she vomiting after taking Flagyl.  She took Zofran before the pills but still was not able to tolerate them.  Rx for Phenergan and new dose of Flagyl 2 g sent to pt preferred pharmacy. Pt to take 2 nausea medications and eat food prior to medication and try again.

## 2018-09-11 ENCOUNTER — Encounter (HOSPITAL_COMMUNITY): Payer: Self-pay | Admitting: *Deleted

## 2018-09-11 ENCOUNTER — Inpatient Hospital Stay (HOSPITAL_COMMUNITY)
Admission: AD | Admit: 2018-09-11 | Discharge: 2018-09-11 | Disposition: A | Discharge status: Home / Self Care | Payer: Medicaid Other | Attending: Obstetrics and Gynecology | Admitting: Obstetrics and Gynecology

## 2018-09-11 ENCOUNTER — Other Ambulatory Visit: Payer: Self-pay

## 2018-09-11 DIAGNOSIS — Z87891 Personal history of nicotine dependence: Secondary | ICD-10-CM | POA: Diagnosis not present

## 2018-09-11 DIAGNOSIS — Z3A10 10 weeks gestation of pregnancy: Secondary | ICD-10-CM | POA: Diagnosis not present

## 2018-09-11 DIAGNOSIS — O26891 Other specified pregnancy related conditions, first trimester: Secondary | ICD-10-CM | POA: Diagnosis present

## 2018-09-11 DIAGNOSIS — T7840XA Allergy, unspecified, initial encounter: Secondary | ICD-10-CM | POA: Diagnosis not present

## 2018-09-11 DIAGNOSIS — L299 Pruritus, unspecified: Secondary | ICD-10-CM | POA: Insufficient documentation

## 2018-09-11 DIAGNOSIS — O9A211 Injury, poisoning and certain other consequences of external causes complicating pregnancy, first trimester: Secondary | ICD-10-CM | POA: Insufficient documentation

## 2018-09-11 DIAGNOSIS — T373X5A Adverse effect of other antiprotozoal drugs, initial encounter: Secondary | ICD-10-CM | POA: Diagnosis not present

## 2018-09-11 MED ORDER — PREDNISONE 10 MG PO TABS: 50.0000 mg | ORAL_TABLET | Freq: Every day | ORAL | 0 refills | Status: AC

## 2018-09-11 MED ORDER — METHYLPREDNISOLONE SODIUM SUCC 125 MG IJ SOLR
125.0000 mg | Freq: Once | INTRAMUSCULAR | Status: AC
  Administered 2018-09-11: 125 mg via INTRAMUSCULAR
  Filled 2018-09-11: qty 2

## 2018-09-11 NOTE — Discharge Instructions (Signed)
Drug Allergy  A drug allergy is when your body reacts in a bad way to a medicine. The reaction may be mild or very bad. In some cases, it can be life-threatening.  If you have an allergic reaction, get help right away. You should get help even if the reaction seems mild.  What are the causes?  This condition is caused by a reaction in your body's defense system (immune system). The system sees a medicine as being harmful when it is not.  What are the signs or symptoms?  Symptoms of a mild reaction   A stuffy nose (nasal congestion).   Tingling in your mouth.   An itchy, red rash.  Symptoms of a very bad reaction   Swelling of your eyes, lips, face, or tongue.   Swelling of the back of your mouth and your throat.   Breathing loudly (wheezing).   A hoarse voice.   Itchy, red, swollen areas of skin (hives).   Feeling dizzy or light-headed.   Passing out (fainting).   Feeling worried or nervous (anxiety).   Feeling mixed up (confused).   Pain in your belly (abdomen).   Trouble with breathing, talking, or swallowing.   A tight feeling in your chest.   Fast or uneven heartbeats (palpitations).   Throwing up (vomiting).   Watery poop (diarrhea).  How is this treated?  There is no cure for allergies. An allergic reaction can be treated with:   Medicines to help your symptoms.   Medicines that you breathe into your lungs (respiratory inhalers).   An allergy shot (epinephrine injection).  For a very bad reaction, you may need to stay in the hospital. Your doctor may teach you how to use an allergy kit (anaphylaxis kit) and how to give yourself an allergy shot. You can give yourself an allergy shot with what is called an auto-injector "pen."  Follow these instructions at home:  If you have a very bad allergy:     Always keep an auto-injector pen or your allergy kit with you. These could save your life. Use them as told by your doctor.   Make sure that you, the people who live with you, and your employer  know:  ? How to use your allergy kit.  ? How to use an auto-injector pen to give you an allergy shot.   If you used your auto-injector pen:  ? Get more medicine for it right away. This is important in case you have another reaction.  ? Get help right away.   Wear a medical alert bracelet or necklace that says you have an allergy, if your doctor tells you to do this.  General instructions   Avoid medicines that you are allergic to.   Take over-the-counter and prescription medicines only as told by your doctor.   Do not drive until your doctor says it is safe.   If you have itchy, red, swollen areas of skin or a rash:  ? Use an over-the-counter medicine (antihistamine) as told by your doctor.  ? Put cold, wet cloths (cold compresses) on your skin.  ? Take baths or showers in cool water. Avoid hot water.   If you had tests done, it is up to you to get your test results. Ask your doctor when your results will be ready.   Tell any doctors who care for you that you have a drug allergy.   Keep all follow-up visits as told by your doctor. This is important.    Contact a doctor if:   You think that you are having a mild allergic reaction.   You have symptoms that last more than 2 days after your reaction.   Your symptoms get worse.   You get new symptoms.  Get help right away if:   You had to use your auto-injector pen. You must go to the emergency room, even if the medicine seems to be working.   You have symptoms of a very bad allergic reaction.  These symptoms may be an emergency. Do not wait to see if the symptoms will go away. Use your auto-injector pen or allergy kit as you have been told. Get medical help right away. Call your local emergency services (911 in the [place] Do not drive yourself to the hospital.  Summary   A drug allergy is when your body reacts in a bad way to a medicine.   Take medicines only as told by your doctor.   Tell any doctors who care for you that you have a drug  allergy.   Always keep an auto-injector pen or your allergy kit with you if you have a very bad allergy.  This information is not intended to replace advice given to you by your health care provider. Make sure you discuss any questions you have with your health care provider.  Document Released: 09/12/2004 Document Revised: 02/18/2018 Document Reviewed: 02/18/2018  Elsevier Interactive Patient Education  2019 [employer]

## 2018-09-11 NOTE — MAU Note (Signed)
Pt presents to MAU with complaints of itching on her arms, legs and back. She was given flagyl 08/27/2018 and states she does not know if it is from that Has not taken as prescribed. Took benadryl last night and the rash seemed to ger worse

## 2018-09-11 NOTE — MAU Provider Note (Signed)
[facility] Attending [facility] Note  Chief Complaint: Pruritis   SUBJECTIVE Sue Graves is a 23 y.o. [id number] at [redacted]w[redacted]d by LMP who presents with intense pruritis since last few days. Took Benadryl and itching got acutely worse, Denies SOB, lip swelling. Has rash. Was on Flagyl for Trich. Took 2 pills, then threw up 2 pills. Had refill sent in, and took some more. It has been a few days since taking flagyl, and yet itching has gotten intensely worse.  Past Medical History:  Diagnosis Date  . Asthma    as a child , no inhaler  . Depression    history - no meds  . Hypertension during pregnancy   . Missed abortion    no surgery required   OB History  Gravida Para Term Preterm AB Living  4 1 1   2 1   SAB TAB Ectopic Multiple Live Births  2     0 1    # Outcome Date GA Lbr Len/2nd Weight Sex Delivery Anes PTL Lv  4 Current           3 Term 03/19/17 [redacted]w[redacted]d 03:39 / 00:42 3036 g M Vag-Spont EPI  LIV  2 SAB 04/2016 [redacted]w[redacted]d         1 SAB 03/2016 [redacted]w[redacted]d          Past Surgical History:  Procedure Laterality Date  . DILATION AND EVACUATION N/A 05/01/2016   Procedure: DILATATION AND EVACUATION for Retained Product;  Surgeon: Tereso Newcomer, MD;  Location: [facility];  Service: Gynecology;  Laterality: N/A;  . WISDOM TOOTH EXTRACTION     Social History   Socioeconomic History  . Marital status: Single    Spouse name: Not on file  . Number of children: Not on file  . Years of education: Not on file  . Highest education level: Not on file  Occupational History  . Not on file  Social Needs  . Financial resource strain: Not on file  . Food insecurity:    Worry: Not on file    Inability: Not on file  . Transportation needs:    Medical: Not on file    Non-medical: Not on file  Tobacco Use  . Smoking status: Former Smoker    Packs/day: 0.25    Years: 3.00    Pack years: 0.75    Types: Cigarettes    Last attempt to quit: 12/17/2016    Years since quitting: 1.7  . Smokeless tobacco:  Never Used  Substance and Sexual Activity  . Alcohol use: Not Currently    Comment: occasionally   . Drug use: No    Types: Marijuana    Comment: last use 02 Aug 2018  . Sexual activity: Not Currently    Birth control/protection: None    Comment: last sex 25 Jul 2018  Lifestyle  . Physical activity:    Days per week: Not on file    Minutes per session: Not on file  . Stress: Not on file  Relationships  . Social connections:    Talks on phone: Not on file    Gets together: Not on file    Attends religious service: Not on file    Active member of club or organization: Not on file    Attends meetings of clubs or organizations: Not on file    Relationship status: Not on file  . Intimate partner violence:    Fear of current or ex partner: Not on file    Emotionally  abused: Not on file    Physically abused: Not on file    Forced sexual activity: Not on file  Other Topics Concern  . Not on file  Social History Narrative  . Not on file   No current facility-administered medications on file prior to encounter.    Current Outpatient Medications on File Prior to Encounter  Medication Sig Dispense Refill  . ondansetron (ZOFRAN ODT) 4 MG disintegrating tablet Take 1 tablet (4 mg total) by mouth every 8 (eight) hours as needed for nausea or vomiting. 20 tablet 1  . Prenatal Vit-Fe Fumarate-FA (PRENATAL COMPLETE) 14-0.4 MG TABS Take 1 tablet by mouth daily. 60 each 0  . promethazine (PHENERGAN) 25 MG tablet Take 0.5-1 tablets (12.5-25 mg total) by mouth every 6 (six) hours as needed for nausea. 30 tablet 2   Allergies  Allergen Reactions  . Other Itching    Scented soaps/body wash  . Tomato Hives and Swelling    ROS: Pertinent items in HPI  OBJECTIVE BP (!) 127/93   Pulse (!) 116   Temp 98.2 F (36.8 C)   Resp 16   LMP 06/29/2018 (LMP Unknown)   SpO2 100%  CONSTITUTIONAL: Well-developed, well-nourished female in no acute distress. Scratching uncontrollably HENT:   Normocephalic, atraumatic, External right and left ear normal. Oropharynx is clear and moist EYES: Conjunctivae and EOM are normal. No scleral icterus.  NECK: Normal range of motion, supple, no masses.  Normal thyroid.  SKIN: erythematous, macular-papular rash diffusely located on all surfaces. NEUROLGIC: Alert and oriented to person, place, and time.  PSYCHIATRIC: Normal mood and affect. Normal behavior. Normal judgment and thought content. CARDIOVASCULAR: Normal heart rate noted RESPIRATORY: Effort and breath sounds normal, no problems with respiration noted. ABDOMEN: Soft, normal bowel sounds, no distention noted.  No tenderness, rebound or guarding.  MUSCULOSKELETAL: Normal range of motion. No tenderness.  No cyanosis, clubbing, or edema.  2+ distal pulses.  Bedside u/s reveals SIUP + flicker, CRL + 9 wk 1 day  MAU COURSE Solu-medrol 125 mg IM given, some relief  ASSESSMENT 1. Allergic reaction to drug, initial encounter     PLAN Discharge home Prednisone burst x 5 days Return with SOB or worsening symptoms, or if not improved  Allergies as of 09/11/2018      Reactions   Other Itching   Scented soaps/body wash   Tomato Hives, Swelling      Medication List    TAKE these medications   ondansetron 4 MG disintegrating tablet Commonly known as:  ZOFRAN ODT Take 1 tablet (4 mg total) by mouth every 8 (eight) hours as needed for nausea or vomiting.   predniSONE 10 MG tablet Commonly known as:  DELTASONE Take 5 tablets (50 mg total) by mouth daily for 5 days.   PRENATAL COMPLETE 14-0.4 MG Tabs Take 1 tablet by mouth daily.   promethazine 25 MG tablet Commonly known as:  PHENERGAN Take 0.5-1 tablets (12.5-25 mg total) by mouth every 6 (six) hours as needed for nausea.        Reva BoresPratt, Tayler Lassen S, MD 09/11/2018 1:47 PM

## 2018-09-15 ENCOUNTER — Telehealth: Payer: Self-pay | Admitting: Obstetrics

## 2018-09-15 NOTE — Telephone Encounter (Signed)
Telephone call x 2 to schedule patient for New OB appointment.  Referral from MAU.  Patient has phone that does not accept incoming calls and no voice mail is available.

## 2018-10-12 ENCOUNTER — Other Ambulatory Visit: Payer: Self-pay

## 2018-10-12 ENCOUNTER — Inpatient Hospital Stay (HOSPITAL_COMMUNITY)
Admission: AD | Admit: 2018-10-12 | Discharge: 2018-10-12 | Discharge status: Against Medical Advice | Payer: Medicaid Other | Attending: Obstetrics & Gynecology | Admitting: Obstetrics & Gynecology

## 2018-10-12 DIAGNOSIS — Z5321 Procedure and treatment not carried out due to patient leaving prior to being seen by health care provider: Secondary | ICD-10-CM | POA: Diagnosis present

## 2018-10-12 NOTE — MAU Note (Signed)
Urine in lab 

## 2018-10-12 NOTE — MAU Note (Signed)
Hasn't started care yet. Hasn't heard the heart beat, hasn't had an [place], doesn't know the sex yet.  (plans to go back to [facility], has not called for appointment) Had a UTI. Had to stop the meds, still itching, still has rash. Still has burning when she urinates.

## 2018-10-12 NOTE — Progress Notes (Signed)
Called for pt in waiting room; not there. Will call pt again in 10-15 mins.

## 2018-10-12 NOTE — Progress Notes (Signed)
Pt called & not in the waiting room

## 2018-11-04 DIAGNOSIS — Z3480 Encounter for supervision of other normal pregnancy, unspecified trimester: Secondary | ICD-10-CM | POA: Insufficient documentation

## 2018-11-05 ENCOUNTER — Encounter: Payer: Self-pay | Admitting: Obstetrics and Gynecology

## 2018-11-05 ENCOUNTER — Other Ambulatory Visit: Payer: Self-pay

## 2018-11-05 ENCOUNTER — Other Ambulatory Visit (HOSPITAL_COMMUNITY)
Admission: RE | Admit: 2018-11-05 | Discharge: 2018-11-05 | Disposition: A | Discharge status: Home / Self Care | Payer: Medicaid Other | Source: Ambulatory Visit | Attending: Obstetrics and Gynecology | Admitting: Obstetrics and Gynecology

## 2018-11-05 ENCOUNTER — Ambulatory Visit (INDEPENDENT_AMBULATORY_CARE_PROVIDER_SITE_OTHER): Payer: Medicaid Other | Admitting: Obstetrics and Gynecology

## 2018-11-05 DIAGNOSIS — O0930 Supervision of pregnancy with insufficient antenatal care, unspecified trimester: Secondary | ICD-10-CM | POA: Insufficient documentation

## 2018-11-05 DIAGNOSIS — Z3481 Encounter for supervision of other normal pregnancy, first trimester: Secondary | ICD-10-CM | POA: Diagnosis not present

## 2018-11-05 DIAGNOSIS — Z3480 Encounter for supervision of other normal pregnancy, unspecified trimester: Secondary | ICD-10-CM

## 2018-11-05 DIAGNOSIS — Z8759 Personal history of other complications of pregnancy, childbirth and the puerperium: Secondary | ICD-10-CM

## 2018-11-05 DIAGNOSIS — O0932 Supervision of pregnancy with insufficient antenatal care, second trimester: Secondary | ICD-10-CM

## 2018-11-05 DIAGNOSIS — O210 Mild hyperemesis gravidarum: Secondary | ICD-10-CM

## 2018-11-05 DIAGNOSIS — Z3A18 18 weeks gestation of pregnancy: Secondary | ICD-10-CM

## 2018-11-05 MED ORDER — ASPIRIN EC 81 MG PO TBEC: 81.0000 mg | DELAYED_RELEASE_TABLET | Freq: Every day | ORAL | 2 refills | Status: DC

## 2018-11-05 MED ORDER — VITAFOL GUMMIES 3.33-0.333-34.8 MG PO CHEW: 2.0000 | CHEWABLE_TABLET | Freq: Every day | ORAL | 6 refills | Status: AC

## 2018-11-05 NOTE — Progress Notes (Signed)
  Subjective:    Sue Graves is a [id number] [redacted]w[redacted]d being seen today for her first obstetrical visit.  Her obstetrical history is significant for previous vaginal delivery complicated by gestational HTN. Patient does not intend to breast feed. Pregnancy history fully reviewed.  Patient reports no complaints.  Vitals:   11/05/18 1016  BP: 97/60  Pulse: 87  Temp: 98.7 F (37.1 C)  Weight: 116 lb 14.4 oz (53 kg)    HISTORY: OB History  Gravida Para Term Preterm AB Living  4 1 1   2 1   SAB TAB Ectopic Multiple Live Births  2     0 1    # Outcome Date GA Lbr Len/2nd Weight Sex Delivery Anes PTL Lv  4 Current           3 Term 03/19/17 [redacted]w[redacted]d 03:39 / 00:42 6 lb 11.1 oz (3.036 kg) M Vag-Spont EPI  LIV  2 SAB 04/2016 [redacted]w[redacted]d         1 SAB 03/2016 [redacted]w[redacted]d          Past Medical History:  Diagnosis Date  . Asthma    as a child , no inhaler  . Depression    history - no meds  . Hypertension during pregnancy   . Missed abortion    no surgery required  . Pregnancy induced hypertension    Past Surgical History:  Procedure Laterality Date  . DILATION AND EVACUATION N/A 05/01/2016   Procedure: DILATATION AND EVACUATION for Retained Product;  Surgeon: Tereso Newcomer, MD;  Location: [facility];  Service: Gynecology;  Laterality: N/A;  . WISDOM TOOTH EXTRACTION     Family History  Problem Relation Age of Onset  . Diabetes Maternal Aunt      Exam    Uterus:  Fundal Height: 19 cm  Pelvic Exam:    Perineum: No Hemorrhoids, Normal Perineum   Vulva: normal   Vagina:  normal mucosa, normal discharge   pH:    Cervix: multiparous appearance and cervix is closed and long   Adnexa: normal adnexa and no mass, fullness, tenderness   Bony Pelvis: gynecoid  System: Breast:  normal appearance, no masses or tenderness   Skin: normal coloration and turgor, no rashes    Neurologic: oriented, no focal deficits   Extremities: normal strength, tone, and muscle mass   HEENT extra ocular movement intact    Mouth/Teeth mucous membranes moist, pharynx normal without lesions and dental hygiene good   Neck supple and no masses   Cardiovascular: regular rate and rhythm   Respiratory:  appears well, vitals normal, no respiratory distress, acyanotic, normal RR, chest clear, no wheezing, crepitations, rhonchi, normal symmetric air entry   Abdomen: soft, non-tender; bowel sounds normal; no masses,  no organomegaly   Urinary:       Assessment:    Pregnancy: [id number] Patient Active Problem List   Diagnosis Date Noted  . History of gestational hypertension 11/05/2018  . Insufficient prenatal care 11/05/2018  . Supervision of other normal pregnancy, antepartum 11/04/2018  . Morning sickness 08/25/2018  . Trichimoniasis 08/25/2018  . Tobacco abuse 09/18/2016        Plan:     Initial labs drawn. Prenatal vitamins. Problem list reviewed and updated. Genetic Screening discussed : patient declined panorama.  Ultrasound discussed; fetal survey: ordered. Rx ASA provided  Follow up in 4 weeks. 50% of 30 min visit spent on counseling and coordination of care.     Tianna Baus 11/05/2018

## 2018-11-05 NOTE — Patient Instructions (Signed)
° °Second Trimester of Pregnancy °The second trimester is from week 14 through week 27 (months 4 through 6). The second trimester is often a time when you feel your best. Your body has adjusted to being pregnant, and you begin to feel better physically. Usually, morning sickness has lessened or quit completely, you may have more energy, and you may have an increase in appetite. The second trimester is also a time when the fetus is growing rapidly. At the end of the sixth month, the fetus is about 9 inches long and weighs about 1½ pounds. You will likely begin to feel the baby move (quickening) between 16 and 20 weeks of pregnancy. °Body changes during your second trimester °Your body continues to go through many changes during your second trimester. The changes vary from woman to woman. °· Your weight will continue to increase. You will notice your lower abdomen bulging out. °· You may begin to get stretch marks on your hips, abdomen, and breasts. °· You may develop headaches that can be relieved by medicines. The medicines should be approved by your health care provider. °· You may urinate more often because the fetus is pressing on your bladder. °· You may develop or continue to have heartburn as a result of your pregnancy. °· You may develop constipation because certain hormones are causing the muscles that push waste through your intestines to slow down. °· You may develop hemorrhoids or swollen, bulging veins (varicose veins). °· You may have back pain. This is caused by: °? Weight gain. °? Pregnancy hormones that are relaxing the joints in your pelvis. °? A shift in weight and the muscles that support your balance. °· Your breasts will continue to grow and they will continue to become tender. °· Your gums may bleed and may be sensitive to brushing and flossing. °· Dark spots or blotches (chloasma, mask of pregnancy) may develop on your face. This will likely fade after the baby is born. °· A dark line from  your belly button to the pubic area (linea nigra) may appear. This will likely fade after the baby is born. °· You may have changes in your hair. These can include thickening of your hair, rapid growth, and changes in texture. Some women also have hair loss during or after pregnancy, or hair that feels dry or thin. Your hair will most likely return to normal after your baby is born. °What to expect at prenatal visits °During a routine prenatal visit: °· You will be weighed to make sure you and the fetus are growing normally. °· Your blood pressure will be taken. °· Your abdomen will be measured to track your baby's growth. °· The fetal heartbeat will be listened to. °· Any test results from the previous visit will be discussed. °Your health care provider may ask you: °· How you are feeling. °· If you are feeling the baby move. °· If you have had any abnormal symptoms, such as leaking fluid, bleeding, severe headaches, or abdominal cramping. °· If you are using any tobacco products, including cigarettes, chewing tobacco, and electronic cigarettes. °· If you have any questions. °Other tests that may be performed during your second trimester include: °· Blood tests that check for: °? Low iron levels (anemia). °? High blood sugar that affects pregnant women (gestational diabetes) between 24 and 28 weeks. °? Rh antibodies. This is to check for a protein on red blood cells (Rh factor). °· Urine tests to check for infections, diabetes, or protein in   the urine. °· An ultrasound to confirm the proper growth and development of the baby. °· An amniocentesis to check for possible genetic problems. °· Fetal screens for spina bifida and Down syndrome. °· HIV (human immunodeficiency virus) testing. Routine prenatal testing includes screening for HIV, unless you choose not to have this test. °Follow these instructions at home: °Medicines °· Follow your health care provider's instructions regarding medicine use. Specific medicines  may be either safe or unsafe to take during pregnancy. °· Take a prenatal vitamin that contains at least 600 micrograms (mcg) of folic acid. °· If you develop constipation, try taking a stool softener if your health care provider approves. °Eating and drinking ° °· Eat a balanced diet that includes fresh fruits and vegetables, whole grains, good sources of protein such as meat, eggs, or tofu, and low-fat dairy. Your health care provider will help you determine the amount of weight gain that is right for you. °· Avoid raw meat and uncooked cheese. These carry germs that can cause birth defects in the baby. °· If you have low calcium intake from food, talk to your health care provider about whether you should take a daily calcium supplement. °· Limit foods that are high in fat and processed sugars, such as fried and sweet foods. °· To prevent constipation: °? Drink enough fluid to keep your urine clear or pale yellow. °? Eat foods that are high in fiber, such as fresh fruits and vegetables, whole grains, and beans. °Activity °· Exercise only as directed by your health care provider. Most women can continue their usual exercise routine during pregnancy. Try to exercise for 30 minutes at least 5 days a week. Stop exercising if you experience uterine contractions. °· Avoid heavy lifting, wear low heel shoes, and practice good posture. °· A sexual relationship may be continued unless your health care provider directs you otherwise. °Relieving pain and discomfort °· Wear a good support bra to prevent discomfort from breast tenderness. °· Take warm sitz baths to soothe any pain or discomfort caused by hemorrhoids. Use hemorrhoid cream if your health care provider approves. °· Rest with your legs elevated if you have leg cramps or low back pain. °· If you develop varicose veins, wear support hose. Elevate your feet for 15 minutes, 3-4 times a day. Limit salt in your diet. °Prenatal Care °· Write down your questions. Take  them to your prenatal visits. °· Keep all your prenatal visits as told by your health care provider. This is important. °Safety °· Wear your seat belt at all times when driving. °· Make a list of emergency phone numbers, including numbers for family, friends, the hospital, and police and fire departments. °General instructions °· Ask your health care provider for a referral to a local prenatal education class. Begin classes no later than the beginning of month 6 of your pregnancy. °· Ask for help if you have counseling or nutritional needs during pregnancy. Your health care provider can offer advice or refer you to specialists for help with various needs. °· Do not use hot tubs, steam rooms, or saunas. °· Do not douche or use tampons or scented sanitary pads. °· Do not cross your legs for long periods of time. °· Avoid cat litter boxes and soil used by cats. These carry germs that can cause birth defects in the baby and possibly loss of the fetus by miscarriage or stillbirth. °· Avoid all smoking, herbs, alcohol, and unprescribed drugs. Chemicals in these products can affect the   formation and growth of the baby. °· Do not use any products that contain nicotine or tobacco, such as cigarettes and e-cigarettes. If you need help quitting, ask your health care provider. °· Visit your dentist if you have not gone yet during your pregnancy. Use a soft toothbrush to brush your teeth and be gentle when you floss. °Contact a health care provider if: °· You have dizziness. °· You have mild pelvic cramps, pelvic pressure, or nagging pain in the abdominal area. °· You have persistent nausea, vomiting, or diarrhea. °· You have a bad smelling vaginal discharge. °· You have pain when you urinate. °Get help right away if: °· You have a fever. °· You are leaking fluid from your vagina. °· You have spotting or bleeding from your vagina. °· You have severe abdominal cramping or pain. °· You have rapid weight gain or weight loss. °· You  have shortness of breath with chest pain. °· You notice sudden or extreme swelling of your face, hands, ankles, feet, or legs. °· You have not felt your baby move in over an hour. °· You have severe headaches that do not go away when you take medicine. °· You have vision changes. °Summary °· The second trimester is from week 14 through week 27 (months 4 through 6). It is also a time when the fetus is growing rapidly. °· Your body goes through many changes during pregnancy. The changes vary from woman to woman. °· Avoid all smoking, herbs, alcohol, and unprescribed drugs. These chemicals affect the formation and growth your baby. °· Do not use any tobacco products, such as cigarettes, chewing tobacco, and e-cigarettes. If you need help quitting, ask your health care provider. °· Contact your health care provider if you have any questions. Keep all prenatal visits as told by your health care provider. This is important. °This information is not intended to replace advice given to you by your health care provider. Make sure you discuss any questions you have with your health care provider. °Document Released: 07/30/2001 Document Revised: 09/10/2016 Document Reviewed: 09/10/2016 °[employer] Interactive Patient Education © 2019 [employer] ° ° °Contraception Choices °Contraception, also called birth control, refers to methods or devices that prevent pregnancy. °Hormonal methods °Contraceptive implant ° °A contraceptive implant is a thin, plastic tube that contains a hormone. It is inserted into the upper part of the arm. It can remain in place for up to 3 years. °Progestin-only injections °Progestin-only injections are injections of progestin, a synthetic form of the hormone progesterone. They are given every 3 months by a health care provider. °Birth control pills ° °Birth control pills are pills that contain hormones that prevent pregnancy. They must be taken once a day, preferably at the same time each day. °Birth  control patch ° °The birth control patch contains hormones that prevent pregnancy. It is placed on the skin and must be changed once a week for three weeks and removed on the fourth week. A prescription is needed to use this method of contraception. °Vaginal ring ° °A vaginal ring contains hormones that prevent pregnancy. It is placed in the vagina for three weeks and removed on the fourth week. After that, the process is repeated with a new ring. A prescription is needed to use this method of contraception. °Emergency contraceptive °Emergency contraceptives prevent pregnancy after unprotected sex. They come in pill form and can be taken up to 5 days after sex. They work best the sooner they are taken after having sex. Most   emergency contraceptives are available without a prescription. This method should not be used as your only form of birth control. °Barrier methods °Female condom ° °A female condom is a thin sheath that is worn over the penis during sex. Condoms keep sperm from going inside a woman's body. They can be used with a spermicide to increase their effectiveness. They should be disposed after a single use. °Female condom ° °A female condom is a soft, loose-fitting sheath that is put into the vagina before sex. The condom keeps sperm from going inside a woman's body. They should be disposed after a single use. °Diaphragm ° °A diaphragm is a soft, dome-shaped barrier. It is inserted into the vagina before sex, along with a spermicide. The diaphragm blocks sperm from entering the uterus, and the spermicide kills sperm. A diaphragm should be left in the vagina for 6-8 hours after sex and removed within 24 hours. °A diaphragm is prescribed and fitted by a health care provider. A diaphragm should be replaced every 1-2 years, after giving birth, after gaining more than 15 lb (6.8 kg), and after pelvic surgery. °Cervical cap ° °A cervical cap is a round, soft latex or plastic cup that fits over the cervix. It is  inserted into the vagina before sex, along with spermicide. It blocks sperm from entering the uterus. The cap should be left in place for 6-8 hours after sex and removed within 48 hours. A cervical cap must be prescribed and fitted by a health care provider. It should be replaced every 2 years. °Sponge ° °A sponge is a soft, circular piece of polyurethane foam with spermicide on it. The sponge helps block sperm from entering the uterus, and the spermicide kills sperm. To use it, you make it wet and then insert it into the vagina. It should be inserted before sex, left in for at least 6 hours after sex, and removed and thrown away within 30 hours. °Spermicides °Spermicides are chemicals that kill or block sperm from entering the cervix and uterus. They can come as a cream, jelly, suppository, foam, or tablet. A spermicide should be inserted into the vagina with an applicator at least 10-15 minutes before sex to allow time for it to work. The process must be repeated every time you have sex. Spermicides do not require a prescription. °Intrauterine contraception °Intrauterine device (IUD) °An IUD is a T-shaped device that is put in a woman's uterus. There are two types: °· Hormone IUD.This type contains progestin, a synthetic form of the hormone progesterone. This type can stay in place for 3-5 years. °· Copper IUD.This type is wrapped in copper wire. It can stay in place for 10 years. ° °Permanent methods of contraception °Female tubal ligation °In this method, a woman's fallopian tubes are sealed, tied, or blocked during surgery to prevent eggs from traveling to the uterus. °Hysteroscopic sterilization °In this method, a small, flexible insert is placed into each fallopian tube. The inserts cause scar tissue to form in the fallopian tubes and block them, so sperm cannot reach an egg. The procedure takes about 3 months to be effective. Another form of birth control must be used during those 3 months. °Female  sterilization °This is a procedure to tie off the tubes that carry sperm (vasectomy). After the procedure, the man can still ejaculate fluid (semen). °Natural planning methods °Natural family planning °In this method, a couple does not have sex on days when the woman could become pregnant. °Calendar method °This means keeping   track of the length of each menstrual cycle, identifying the days when pregnancy can happen, and not having sex on those days. °Ovulation method °In this method, a couple avoids sex during ovulation. °Symptothermal method °This method involves not having sex during ovulation. The woman typically checks for ovulation by watching changes in her temperature and in the consistency of cervical mucus. °Post-ovulation method °In this method, a couple waits to have sex until after ovulation. °Summary °· Contraception, also called birth control, means methods or devices that prevent pregnancy. °· Hormonal methods of contraception include implants, injections, pills, patches, vaginal rings, and emergency contraceptives. °· Barrier methods of contraception can include female condoms, female condoms, diaphragms, cervical caps, sponges, and spermicides. °· There are two types of IUDs (intrauterine devices). An IUD can be put in a woman's uterus to prevent pregnancy for 3-5 years. °· Permanent sterilization can be done through a procedure for males, females, or both. °· Natural family planning methods involve not having sex on days when the woman could become pregnant. °This information is not intended to replace advice given to you by your health care provider. Make sure you discuss any questions you have with your health care provider. °Document Released: 08/05/2005 Document Revised: 08/07/2017 Document Reviewed: 09/07/2016 °[employer] Interactive Patient Education © 2019 [employer] ° ° °Breastfeeding ° °Choosing to breastfeed is one of the best decisions you can make for yourself and your baby. A change in  hormones during pregnancy causes your breasts to make breast milk in your milk-producing glands. Hormones prevent breast milk from being released before your baby is born. They also prompt milk flow after birth. Once breastfeeding has begun, thoughts of your baby, as well as his or her sucking or crying, can stimulate the release of milk from your milk-producing glands. °Benefits of breastfeeding °Research shows that breastfeeding offers many health benefits for infants and mothers. It also offers a cost-free and convenient way to feed your baby. °For your baby °· Your first milk (colostrum) helps your baby's digestive system to function better. °· Special cells in your milk (antibodies) help your baby to fight off infections. °· Breastfed babies are less likely to develop asthma, allergies, obesity, or type 2 diabetes. They are also at lower risk for sudden infant death syndrome (SIDS). °· Nutrients in breast milk are better able to meet your baby’s needs compared to infant formula. °· Breast milk improves your baby's brain development. °For you °· Breastfeeding helps to create a very special bond between you and your baby. °· Breastfeeding is convenient. Breast milk costs nothing and is always available at the correct temperature. °· Breastfeeding helps to burn calories. It helps you to lose the weight that you gained during pregnancy. °· Breastfeeding makes your uterus return faster to its size before pregnancy. It also slows bleeding (lochia) after you give birth. °· Breastfeeding helps to lower your risk of developing type 2 diabetes, osteoporosis, rheumatoid arthritis, cardiovascular disease, and breast, ovarian, uterine, and endometrial cancer later in life. °Breastfeeding basics °Starting breastfeeding °· Find a comfortable place to sit or lie down, with your neck and back well-supported. °· Place a pillow or a rolled-up blanket under your baby to bring him or her to the level of your breast (if you are  seated). Nursing pillows are specially designed to help support your arms and your baby while you breastfeed. °· Make sure that your baby's tummy (abdomen) is facing your abdomen. °· Gently massage your breast. With your fingertips, massage from   the outer edges of your breast inward toward the nipple. This encourages milk flow. If your milk flows slowly, you may need to continue this action during the feeding. °· Support your breast with 4 fingers underneath and your thumb above your nipple (make the letter "C" with your hand). Make sure your fingers are well away from your nipple and your baby’s mouth. °· Stroke your baby's lips gently with your finger or nipple. °· When your baby's mouth is open wide enough, quickly bring your baby to your breast, placing your entire nipple and as much of the areola as possible into your baby's mouth. The areola is the colored area around your nipple. °? More areola should be visible above your baby's upper lip than below the lower lip. °? Your baby's lips should be opened and extended outward (flanged) to ensure an adequate, comfortable latch. °? Your baby's tongue should be between his or her lower gum and your breast. °· Make sure that your baby's mouth is correctly positioned around your nipple (latched). Your baby's lips should create a seal on your breast and be turned out (everted). °· It is common for your baby to suck about 2-3 minutes in order to start the flow of breast milk. °Latching °Teaching your baby how to latch onto your breast properly is very important. An improper latch can cause nipple pain, decreased milk supply, and poor weight gain in your baby. Also, if your baby is not latched onto your nipple properly, he or she may swallow some air during feeding. This can make your baby fussy. Burping your baby when you switch breasts during the feeding can help to get rid of the air. However, teaching your baby to latch on properly is still the best way to prevent  fussiness from swallowing air while breastfeeding. °Signs that your baby has successfully latched onto your nipple °· Silent tugging or silent sucking, without causing you pain. Infant's lips should be extended outward (flanged). °· Swallowing heard between every 3-4 sucks once your milk has started to flow (after your let-down milk reflex occurs). °· Muscle movement above and in front of his or her ears while sucking. °Signs that your baby has not successfully latched onto your nipple °· Sucking sounds or smacking sounds from your baby while breastfeeding. °· Nipple pain. °If you think your baby has not latched on correctly, slip your finger into the corner of your baby’s mouth to break the suction and place it between your baby's gums. Attempt to start breastfeeding again. °Signs of successful breastfeeding °Signs from your baby °· Your baby will gradually decrease the number of sucks or will completely stop sucking. °· Your baby will fall asleep. °· Your baby's body will relax. °· Your baby will retain a small amount of milk in his or her mouth. °· Your baby will let go of your breast by himself or herself. °Signs from you °· Breasts that have increased in firmness, weight, and size 1-3 hours after feeding. °· Breasts that are softer immediately after breastfeeding. °· Increased milk volume, as well as a change in milk consistency and color by the fifth day of breastfeeding. °· Nipples that are not sore, cracked, or bleeding. °Signs that your baby is getting enough milk °· Wetting at least 1-2 diapers during the first 24 hours after birth. °· Wetting at least 5-6 diapers every 24 hours for the first week after birth. The urine should be clear or pale yellow by the age of 5 days. °·   Wetting 6-8 diapers every 24 hours as your baby continues to grow and develop. °· At least 3 stools in a 24-hour period by the age of 5 days. The stool should be soft and yellow. °· At least 3 stools in a 24-hour period by the age of 7  days. The stool should be seedy and yellow. °· No loss of weight greater than 10% of birth weight during the first 3 days of life. °· Average weight gain of 4-7 oz (113-198 g) per week after the age of 4 days. °· Consistent daily weight gain by the age of 5 days, without weight loss after the age of 2 weeks. °After a feeding, your baby may spit up a small amount of milk. This is normal. °Breastfeeding frequency and duration °Frequent feeding will help you make more milk and can prevent sore nipples and extremely full breasts (breast engorgement). Breastfeed when you feel the need to reduce the fullness of your breasts or when your baby shows signs of hunger. This is called "breastfeeding on demand." Signs that your baby is hungry include: °· Increased alertness, activity, or restlessness. °· Movement of the head from side to side. °· Opening of the mouth when the corner of the mouth or cheek is stroked (rooting). °· Increased sucking sounds, smacking lips, cooing, sighing, or squeaking. °· Hand-to-mouth movements and sucking on fingers or hands. °· Fussing or crying. °Avoid introducing a pacifier to your baby in the first 4-6 weeks after your baby is born. After this time, you may choose to use a pacifier. Research has shown that pacifier use during the first year of a baby's life decreases the risk of sudden infant death syndrome (SIDS). °Allow your baby to feed on each breast as long as he or she wants. When your baby unlatches or falls asleep while feeding from the first breast, offer the second breast. Because newborns are often sleepy in the first few weeks of life, you may need to awaken your baby to get him or her to feed. °Breastfeeding times will vary from baby to baby. However, the following rules can serve as a guide to help you make sure that your baby is properly fed: °· Newborns (babies 4 weeks of age or younger) may breastfeed every 1-3 hours. °· Newborns should not go without breastfeeding for longer  than 3 hours during the day or 5 hours during the night. °· You should breastfeed your baby a minimum of 8 times in a 24-hour period. °Breast milk pumping ° °  ° °Pumping and storing breast milk allows you to make sure that your baby is exclusively fed your breast milk, even at times when you are unable to breastfeed. This is especially important if you go back to work while you are still breastfeeding, or if you are not able to be present during feedings. Your lactation consultant can help you find a method of pumping that works best for you and give you guidelines about how long it is safe to store breast milk. °Caring for your breasts while you breastfeed °Nipples can become dry, cracked, and sore while breastfeeding. The following recommendations can help keep your breasts moisturized and healthy: °· Avoid using soap on your nipples. °· Wear a supportive bra designed especially for nursing. Avoid wearing underwire-style bras or extremely tight bras (sports bras). °· Air-dry your nipples for 3-4 minutes after each feeding. °· Use only cotton bra pads to absorb leaked breast milk. Leaking of breast milk between feedings is   normal. °· Use lanolin on your nipples after breastfeeding. Lanolin helps to maintain your skin's normal moisture barrier. Pure lanolin is not harmful (not toxic) to your baby. You may also hand express a few drops of breast milk and gently massage that milk into your nipples and allow the milk to air-dry. °In the first few weeks after giving birth, some women experience breast engorgement. Engorgement can make your breasts feel heavy, warm, and tender to the touch. Engorgement peaks within 3-5 days after you give birth. The following recommendations can help to ease engorgement: °· Completely empty your breasts while breastfeeding or pumping. You may want to start by applying warm, moist heat (in the shower or with warm, water-soaked hand towels) just before feeding or pumping. This increases  circulation and helps the milk flow. If your baby does not completely empty your breasts while breastfeeding, pump any extra milk after he or she is finished. °· Apply ice packs to your breasts immediately after breastfeeding or pumping, unless this is too uncomfortable for you. To do this: °? Put ice in a plastic bag. °? Place a towel between your skin and the bag. °? Leave the ice on for 20 minutes, 2-3 times a day. °· Make sure that your baby is latched on and positioned properly while breastfeeding. °If engorgement persists after 48 hours of following these recommendations, contact your health care provider or a lactation consultant. °Overall health care recommendations while breastfeeding °· Eat 3 healthy meals and 3 snacks every day. Well-nourished mothers who are breastfeeding need an additional 450-500 calories a day. You can meet this requirement by increasing the amount of a balanced diet that you eat. °· Drink enough water to keep your urine pale yellow or clear. °· Rest often, relax, and continue to take your prenatal vitamins to prevent fatigue, stress, and low vitamin and mineral levels in your body (nutrient deficiencies). °· Do not use any products that contain nicotine or tobacco, such as cigarettes and e-cigarettes. Your baby may be harmed by chemicals from cigarettes that pass into breast milk and exposure to secondhand smoke. If you need help quitting, ask your health care provider. °· Avoid alcohol. °· Do not use illegal drugs or marijuana. °· Talk with your health care provider before taking any medicines. These include over-the-counter and prescription medicines as well as vitamins and herbal supplements. Some medicines that may be harmful to your baby can pass through breast milk. °· It is possible to become pregnant while breastfeeding. If birth control is desired, ask your health care provider about options that will be safe while breastfeeding your baby. °Where to find more  information: °[employer]: www.llli.org °Contact a health care provider if: °· You feel like you want to stop breastfeeding or have become frustrated with breastfeeding. °· Your nipples are cracked or bleeding. °· Your breasts are red, tender, or warm. °· You have: °? Painful breasts or nipples. °? A swollen area on either breast. °? A fever or chills. °? Nausea or vomiting. °? Drainage other than breast milk from your nipples. °· Your breasts do not become full before feedings by the fifth day after you give birth. °· You feel sad and depressed. °· Your baby is: °? Too sleepy to eat well. °? Having trouble sleeping. °? More than 1 week old and wetting fewer than 6 diapers in a 24-hour period. °? Not gaining weight by 5 days of age. °· Your baby has fewer than 3 stools in a   24-hour period. °· Your baby's skin or the white parts of his or her eyes become yellow. °Get help right away if: °· Your baby is overly tired (lethargic) and does not want to wake up and feed. °· Your baby develops an unexplained fever. °Summary °· Breastfeeding offers many health benefits for infant and mothers. °· Try to breastfeed your infant when he or she shows early signs of hunger. °· Gently tickle or stroke your baby's lips with your finger or nipple to allow the baby to open his or her mouth. Bring the baby to your breast. Make sure that much of the areola is in your baby's mouth. Offer one side and burp the baby before you offer the other side. °· Talk with your health care provider or lactation consultant if you have questions or you face problems as you breastfeed. °This information is not intended to replace advice given to you by your health care provider. Make sure you discuss any questions you have with your health care provider. °Document Released: 08/05/2005 Document Revised: 09/06/2016 Document Reviewed: 09/06/2016 °Elsevier Interactive Patient Education © 2019 [employer]vier Inc. ° °

## 2018-11-05 NOTE — Progress Notes (Signed)
New OB.  Declined FLU vaccine 

## 2018-11-06 LAB — OBSTETRIC PANEL, INCLUDING HIV
Antibody Screen: NEGATIVE
Basophils Absolute: 0 10*3/uL (ref 0.0–0.2)
Basos: 0 %
EOS (ABSOLUTE): 0 10*3/uL (ref 0.0–0.4)
Eos: 1 %
HIV Screen 4th Generation wRfx: NONREACTIVE
Hematocrit: 37.1 % (ref 34.0–46.6)
Hemoglobin: 12.4 g/dL (ref 11.1–15.9)
Hepatitis B Surface Ag: NEGATIVE
Immature Grans (Abs): 0 10*3/uL (ref 0.0–0.1)
Immature Granulocytes: 0 %
Lymphocytes Absolute: 1.6 10*3/uL (ref 0.7–3.1)
Lymphs: 24 %
MCH: 31.1 pg (ref 26.6–33.0)
MCHC: 33.4 g/dL (ref 31.5–35.7)
MCV: 93 fL (ref 79–97)
Monocytes Absolute: 0.5 10*3/uL (ref 0.1–0.9)
Monocytes: 7 %
Neutrophils Absolute: 4.6 10*3/uL (ref 1.4–7.0)
Neutrophils: 68 %
Platelets: 280 10*3/uL (ref 150–450)
RBC: 3.99 x10E6/uL (ref 3.77–5.28)
RDW: 13.8 % (ref 11.7–15.4)
RPR Ser Ql: NONREACTIVE
RUBELLA: 8.09 {index} (ref >0.99)
Rh Factor: POSITIVE
WBC: 6.8 10*3/uL (ref 3.4–10.8)

## 2018-11-06 LAB — CYTOLOGY - PAP
Diagnosis: NEGATIVE
HPV: DETECTED — AB

## 2018-11-07 LAB — CERVICOVAGINAL ANCILLARY ONLY
Bacterial vaginitis: NEGATIVE
Candida vaginitis: POSITIVE — AB
Chlamydia: NEGATIVE
Neisseria Gonorrhea: NEGATIVE
Trichomonas: NEGATIVE

## 2018-11-07 MED ORDER — TERCONAZOLE 0.8 % VA CREA: 1.0000 | TOPICAL_CREAM | Freq: Every day | VAGINAL | 0 refills | Status: DC

## 2018-11-07 NOTE — Addendum Note (Signed)
Addended by: Catalina Antigua on: 11/07/2018 08:29 PM   Modules accepted: Orders

## 2018-11-09 ENCOUNTER — Encounter (HOSPITAL_COMMUNITY): Payer: Self-pay

## 2018-11-09 ENCOUNTER — Encounter: Payer: Self-pay | Admitting: Obstetrics and Gynecology

## 2018-11-09 DIAGNOSIS — R8271 Bacteriuria: Secondary | ICD-10-CM | POA: Insufficient documentation

## 2018-11-09 LAB — URINE CULTURE, OB REFLEX

## 2018-11-09 LAB — CULTURE, OB URINE

## 2018-11-16 ENCOUNTER — Other Ambulatory Visit: Payer: Self-pay

## 2018-11-16 ENCOUNTER — Other Ambulatory Visit (HOSPITAL_COMMUNITY): Payer: Self-pay | Admitting: *Deleted

## 2018-11-16 ENCOUNTER — Ambulatory Visit (HOSPITAL_COMMUNITY)
Admission: RE | Admit: 2018-11-16 | Discharge: 2018-11-16 | Disposition: A | Discharge status: Home / Self Care | Payer: Medicaid Other | Source: Ambulatory Visit | Attending: Obstetrics and Gynecology | Admitting: Obstetrics and Gynecology

## 2018-11-16 DIAGNOSIS — Z363 Encounter for antenatal screening for malformations: Secondary | ICD-10-CM

## 2018-11-16 DIAGNOSIS — Z3480 Encounter for supervision of other normal pregnancy, unspecified trimester: Secondary | ICD-10-CM | POA: Diagnosis present

## 2018-11-16 DIAGNOSIS — Z3A18 18 weeks gestation of pregnancy: Secondary | ICD-10-CM | POA: Diagnosis not present

## 2018-11-16 DIAGNOSIS — Z362 Encounter for other antenatal screening follow-up: Secondary | ICD-10-CM

## 2018-11-25 ENCOUNTER — Encounter: Payer: Self-pay | Admitting: Obstetrics and Gynecology

## 2018-12-01 ENCOUNTER — Telehealth: Payer: Self-pay

## 2018-12-01 DIAGNOSIS — O285 Abnormal chromosomal and genetic finding on antenatal screening of mother: Secondary | ICD-10-CM

## 2018-12-01 NOTE — Telephone Encounter (Signed)
From: Tereso Newcomer, MD  Sent: 11/26/2018 11:15 AM EDT  To: [facility]   Increased carrier risk of SMN. FOB testing, genetic counseling recommended. Please call to inform patient of results and recommendations.

## 2018-12-02 ENCOUNTER — Other Ambulatory Visit: Payer: Self-pay

## 2018-12-02 ENCOUNTER — Ambulatory Visit (INDEPENDENT_AMBULATORY_CARE_PROVIDER_SITE_OTHER): Payer: Medicaid Other | Admitting: Obstetrics and Gynecology

## 2018-12-02 ENCOUNTER — Encounter: Payer: Self-pay | Admitting: Obstetrics and Gynecology

## 2018-12-02 DIAGNOSIS — A599 Trichomoniasis, unspecified: Secondary | ICD-10-CM | POA: Diagnosis not present

## 2018-12-02 DIAGNOSIS — Z3A22 22 weeks gestation of pregnancy: Secondary | ICD-10-CM

## 2018-12-02 DIAGNOSIS — O98912 Unspecified maternal infectious and parasitic disease complicating pregnancy, second trimester: Secondary | ICD-10-CM | POA: Diagnosis not present

## 2018-12-02 DIAGNOSIS — R8271 Bacteriuria: Secondary | ICD-10-CM

## 2018-12-02 DIAGNOSIS — Z3480 Encounter for supervision of other normal pregnancy, unspecified trimester: Secondary | ICD-10-CM

## 2018-12-02 NOTE — Progress Notes (Addendum)
   TELEHEALTH VIRTUAL OBSTETRICS VISIT ENCOUNTER NOTE  I connected with Sue Graves on 12/02/18 at  1:30 PM EDT by telephone at home and verified that I am speaking with the correct person using two identifiers.   I discussed the limitations, risks, security and privacy concerns of performing an evaluation and management service by telephone and the availability of in person appointments. I also discussed with the patient that there may be a patient responsible charge related to this service. The patient expressed understanding and agreed to proceed.  Subjective:  Sue Graves is a 23 y.o. G4P1021 at [redacted]w[redacted]d being followed for ongoing prenatal care.  She is currently monitored for the following issues for this high-risk pregnancy and has Tobacco abuse; Morning sickness; Trichimoniasis; Supervision of other normal pregnancy, antepartum; History of gestational hypertension; Insufficient prenatal care; and GBS bacteriuria on their problem list.  Patient reports no complaints. Reports fetal movement. Denies any contractions, bleeding or leaking of fluid.   The following portions of the patient's history were reviewed and updated as appropriate: allergies, current medications, past family history, past medical history, past social history, past surgical history and problem list.   Objective:   General:  Alert, oriented and cooperative.   Mental Status: Normal mood and affect perceived. Normal judgment and thought content.  Rest of physical exam deferred due to type of encounter  Assessment and Plan:  Pregnancy: G4P1021 at [redacted]w[redacted]d 1. Supervision of other normal pregnancy, antepartum Routine care. 28wk labs nv. Does not have access to bp cuff or scale.  - Babyscripts Schedule Optimization   Preterm labor symptoms and general obstetric precautions including but not limited to vaginal bleeding, contractions, leaking of fluid and fetal movement were reviewed in detail with the patient.  I discussed  the assessment and treatment plan with the patient. The patient was provided an opportunity to ask questions and all were answered. The patient agreed with the plan and demonstrated an understanding of the instructions. The patient was advised to call back or seek an in-person office evaluation/go to MAU at [facility] for any urgent or concerning symptoms. Please refer to After Visit Summary for other counseling recommendations.   I provided 10 minutes of non-face-to-face time during this encounter.  No follow-ups on file.  Future Appointments  Date Time Provider Department Center  12/28/2018  8:45 AM [facility] [place] 2 [facility]    Rivereno Bing, MD Center for [facility], [facility]

## 2018-12-02 NOTE — Progress Notes (Signed)
ROB w/ complaints of pelvic pain and pressure , denies any leaking and no vaginal bleeding.  Pain is 6/10x  "Pt hurts so bad per pt she has to stop moving"

## 2018-12-13 ENCOUNTER — Other Ambulatory Visit: Payer: Self-pay

## 2018-12-13 ENCOUNTER — Inpatient Hospital Stay (HOSPITAL_COMMUNITY)
Admission: AD | Admit: 2018-12-13 | Discharge: 2018-12-13 | Disposition: A | Discharge status: Home / Self Care | Payer: Medicaid Other | Attending: Obstetrics & Gynecology | Admitting: Obstetrics & Gynecology

## 2018-12-13 DIAGNOSIS — R12 Heartburn: Secondary | ICD-10-CM | POA: Diagnosis not present

## 2018-12-13 DIAGNOSIS — Z91048 Other nonmedicinal substance allergy status: Secondary | ICD-10-CM | POA: Insufficient documentation

## 2018-12-13 DIAGNOSIS — Z91018 Allergy to other foods: Secondary | ICD-10-CM | POA: Diagnosis not present

## 2018-12-13 DIAGNOSIS — R109 Unspecified abdominal pain: Secondary | ICD-10-CM

## 2018-12-13 DIAGNOSIS — O132 Gestational [pregnancy-induced] hypertension without significant proteinuria, second trimester: Secondary | ICD-10-CM | POA: Insufficient documentation

## 2018-12-13 DIAGNOSIS — O26892 Other specified pregnancy related conditions, second trimester: Secondary | ICD-10-CM | POA: Diagnosis not present

## 2018-12-13 DIAGNOSIS — R1013 Epigastric pain: Secondary | ICD-10-CM | POA: Diagnosis not present

## 2018-12-13 DIAGNOSIS — Z148 Genetic carrier of other disease: Secondary | ICD-10-CM

## 2018-12-13 DIAGNOSIS — Z833 Family history of diabetes mellitus: Secondary | ICD-10-CM | POA: Insufficient documentation

## 2018-12-13 DIAGNOSIS — Z87891 Personal history of nicotine dependence: Secondary | ICD-10-CM | POA: Diagnosis not present

## 2018-12-13 DIAGNOSIS — Z3A23 23 weeks gestation of pregnancy: Secondary | ICD-10-CM | POA: Insufficient documentation

## 2018-12-13 DIAGNOSIS — Z881 Allergy status to other antibiotic agents status: Secondary | ICD-10-CM | POA: Diagnosis not present

## 2018-12-13 DIAGNOSIS — O9989 Other specified diseases and conditions complicating pregnancy, childbirth and the puerperium: Secondary | ICD-10-CM | POA: Insufficient documentation

## 2018-12-13 LAB — URINALYSIS, ROUTINE W REFLEX MICROSCOPIC
Bilirubin Urine: NEGATIVE
Glucose, UA: NEGATIVE mg/dL
Hgb urine dipstick: NEGATIVE
Ketones, ur: NEGATIVE mg/dL
Nitrite: NEGATIVE
Protein, ur: NEGATIVE mg/dL
Specific Gravity, Urine: 1.013 (ref 1.005–1.030)
pH: 7 (ref 5.0–8.0)

## 2018-12-13 MED ORDER — FAMOTIDINE 40 MG PO TABS: 40.0000 mg | ORAL_TABLET | Freq: Every evening | ORAL | 1 refills | Status: DC

## 2018-12-13 NOTE — Discharge Instructions (Signed)
Drink at least 8 8-oz glasses of water every day. Take Tylenol 325 mg 2 tablets by mouth every 4 hours if needed for pain. Return if you have vaginal bleeding, leaking or the baby is not moving. The office will call you about getting your blood pressure cuff at home. Continue social distancing and safe practices to avoid Covid 19.

## 2018-12-13 NOTE — MAU Note (Signed)
Pt reports lower abd pain for past week that she rates 6/10.  Pt denies vag bleeding or LOF.

## 2018-12-13 NOTE — MAU Provider Note (Signed)
History     CSN: [id number]  Arrival date and time: 12/13/18 1406   First Provider Initiated Contact with Patient 12/13/18 1511      Chief Complaint  Patient presents with  . Abdominal Pain   HPI Sue Graves 23 y.o. [redacted]w[redacted]d  Comes today for lower abdominal pain that has been all day.  She thinks the baby is dropping too early.  Has not had vaginal bleeding or leaking of fluid.   The other pain she is having periodically is 6-7/10.    OB History    Gravida  4   Para  1   Term  1   Preterm      AB  2   Living  1     SAB  2   TAB      Ectopic      Multiple  0   Live Births  1           Past Medical History:  Diagnosis Date  . Asthma    as a child , no inhaler  . Depression    history - no meds  . Hypertension during pregnancy   . Missed abortion    no surgery required  . Pregnancy induced hypertension     Past Surgical History:  Procedure Laterality Date  . DILATION AND EVACUATION N/A 05/01/2016   Procedure: DILATATION AND EVACUATION for Retained Product;  Surgeon: Tereso Newcomer, MD;  Location: [facility];  Service: Gynecology;  Laterality: N/A;  . WISDOM TOOTH EXTRACTION      Family History  Problem Relation Age of Onset  . Diabetes Maternal Aunt     Social History   Tobacco Use  . Smoking status: Former Smoker    Packs/day: 0.25    Years: 3.00    Pack years: 0.75    Types: Cigarettes    Last attempt to quit: 12/17/2016    Years since quitting: 1.9  . Smokeless tobacco: Never Used  Substance Use Topics  . Alcohol use: Not Currently    Comment: occasionally   . Drug use: Not Currently    Types: Marijuana    Comment: last use 02 Aug 2018    Allergies:  Allergies  Allergen Reactions  . Flagyl [Metronidazole] Rash  . Other Itching    Scented soaps/body wash  . Tomato Hives and Swelling    Medications Prior to Admission  Medication Sig Dispense Refill Last Dose  . aspirin EC 81 MG tablet Take 1 tablet (81 mg total) by mouth  daily. Take after 12 weeks for prevention of preeclampsia later in pregnancy 300 tablet 2 12/12/2018 at Unknown time  . ondansetron (ZOFRAN ODT) 4 MG disintegrating tablet Take 1 tablet (4 mg total) by mouth every 8 (eight) hours as needed for nausea or vomiting. (Patient not taking: Reported on 11/05/2018) 20 tablet 1 Not Taking  . Prenatal Vit-Fe Fumarate-FA (PRENATAL COMPLETE) 14-0.4 MG TABS Take 1 tablet by mouth daily. (Patient not taking: Reported on 11/05/2018) 60 each 0 Not Taking  . promethazine (PHENERGAN) 25 MG tablet Take 0.5-1 tablets (12.5-25 mg total) by mouth every 6 (six) hours as needed for nausea. (Patient not taking: Reported on 11/05/2018) 30 tablet 2 Not Taking  . terconazole (TERAZOL 3) 0.8 % vaginal cream Place 1 applicator vaginally at bedtime. Apply nightly for three nights. (Patient not taking: Reported on 12/02/2018) 20 g 0 Not Taking    Review of Systems  Constitutional: Negative for fever.  Respiratory: Negative for cough.  Gastrointestinal: Positive for abdominal pain and nausea. Negative for vomiting.  Genitourinary: Positive for dysuria. Negative for vaginal bleeding and vaginal discharge.   Physical Exam   Blood pressure 119/69, pulse 78, temperature 98.3 F (36.8 C), temperature source Oral, resp. rate 18, last menstrual period 06/29/2018, not currently breastfeeding.  Physical Exam  Nursing note and vitals reviewed. Constitutional: She is oriented to person, place, and time. She appears well-developed and well-nourished.  HENT:  Head: Normocephalic.  Eyes: EOM are normal.  Neck: Neck supple.  GI: Soft. There is no abdominal tenderness. There is no rebound and no guarding.  On fetal monitor and very difficult to trace continuously.  Baby is moving lots and has to be tracked down with monitor.  FHT baseline is 135 with moderate variability.  No contractions.  Reassuring strip for current gestational age. Client does have some pubic symphysis discomfort with  palpation.  Genitourinary:    Genitourinary Comments: Cervical check - closed, presenting part is not in the pelvis, no blood on glove   Musculoskeletal: Normal range of motion.  Neurological: She is alert and oriented to person, place, and time.  Skin: Skin is warm and dry.  Psychiatric: She has a normal mood and affect.    MAU Course  Procedures Results for orders placed or performed during the hospital encounter of 12/13/18 (from the past 24 hour(s))  Urinalysis, Routine w reflex microscopic     Status: Abnormal   Collection Time: 12/13/18  4:29 PM  Result Value Ref Range   Color, Urine YELLOW YELLOW   APPearance HAZY (A) CLEAR   Specific Gravity, Urine 1.013 1.005 - 1.030   pH 7.0 5.0 - 8.0   Glucose, UA NEGATIVE NEGATIVE mg/dL   Hgb urine dipstick NEGATIVE NEGATIVE   Bilirubin Urine NEGATIVE NEGATIVE   Ketones, ur NEGATIVE NEGATIVE mg/dL   Protein, ur NEGATIVE NEGATIVE mg/dL   Nitrite NEGATIVE NEGATIVE   Leukocytes,Ua SMALL (A) NEGATIVE   RBC / HPF 0-5 0 - 5 RBC/hpf   WBC, UA 0-5 0 - 5 WBC/hpf   Bacteria, UA RARE (A) NONE SEEN   Squamous Epithelial / LPF 6-10 0 - 5   Mucus PRESENT    MDM Will check urine today as client is reporting periodic dysria.  Did have pubic symphysis pain and discussed with her this is normal in a second pregnancy.  Advised that she may have more pelvic pressure than in a previous pregnancy and that could be normal as well. Client took a very long time to collect a urine specimen.  At time of cervical exam, client was resting and dozing.  Upon awakening, she states that her pain is better.  Client requests note for work today since she did not go to work.  Assessment and Plan  Abdominal pain in pregnancy, currently resolved  Plan Message sent to the office.  Last note says she is optimized on Babyscripts but client reports she does not have a BP cuff and is not checking her pressures at home. Is reporting lots of epigastric pain - took liquid  Zantac last pregnancy and was pleased with it but it has been withdrawn from the market - will try on Pepcid pills - client to call the office if she needs liquid - medication sent to her pharmacy today. Drink at least 8 8-oz glasses of water every day. Take Tylenol 325 mg 2 tablets by mouth every 4 hours if needed for pain. Return if you have vaginal bleeding, leaking or the  baby is not moving.  Terri L Burleson 12/13/2018, 3:37 PM

## 2018-12-17 ENCOUNTER — Encounter: Payer: Self-pay | Admitting: Obstetrics and Gynecology

## 2018-12-28 ENCOUNTER — Ambulatory Visit (HOSPITAL_COMMUNITY): Payer: No Typology Code available for payment source | Attending: Maternal & Fetal Medicine

## 2018-12-28 ENCOUNTER — Encounter (HOSPITAL_COMMUNITY): Payer: Self-pay

## 2018-12-31 ENCOUNTER — Other Ambulatory Visit: Payer: Medicaid Other

## 2018-12-31 ENCOUNTER — Encounter: Payer: Medicaid Other | Admitting: Obstetrics and Gynecology

## 2019-01-07 ENCOUNTER — Other Ambulatory Visit: Payer: Self-pay

## 2019-01-07 ENCOUNTER — Ambulatory Visit (INDEPENDENT_AMBULATORY_CARE_PROVIDER_SITE_OTHER): Payer: Medicaid Other

## 2019-01-07 DIAGNOSIS — Z3482 Encounter for supervision of other normal pregnancy, second trimester: Secondary | ICD-10-CM

## 2019-01-07 DIAGNOSIS — Z3480 Encounter for supervision of other normal pregnancy, unspecified trimester: Secondary | ICD-10-CM

## 2019-01-07 NOTE — Progress Notes (Signed)
   TELEHEALTH VIRTUAL OBSTETRICS VISIT ENCOUNTER NOTE  I connected with Sue Graves on 01/07/19 at  4:00 PM EDT by WebEx at home and verified that I am speaking with the correct person using two identifiers.   I discussed the limitations, risks, security and privacy concerns of performing an evaluation and management service by telephone and the availability of in person appointments. I also discussed with the patient that there may be a patient responsible charge related to this service. The patient expressed understanding and agreed to proceed.  Subjective:  Sue Graves is a 23 y.o. [id number] at [redacted]w[redacted]d being followed for ongoing prenatal care.  She is currently monitored for the following issues for this low-risk pregnancy and has Tobacco abuse; Morning sickness; Trichimoniasis; Supervision of other normal pregnancy, antepartum; History of gestational hypertension; Insufficient prenatal care; GBS bacteriuria; and Carrier of genetic disorder on their problem list.  Patient reports no complaints. Reports fetal movement. Denies any contractions, bleeding or leaking of fluid.   The following portions of the patient's history were reviewed and updated as appropriate: allergies, current medications, past family history, past medical history, past social history, past surgical history and problem list.   Objective:   General:  Alert, oriented and cooperative.   Mental Status: Normal mood and affect perceived. Normal judgment and thought content.  Rest of physical exam deferred due to type of encounter  Assessment and Plan:  Pregnancy: G4P1021 at [redacted]w[redacted]d 1. Supervision of other normal pregnancy, antepartum -Patient's babyRX sent to wrong email. Will reorder with correct email. Discussed proper BP taking and when to call the office - Enroll Patient in Babyscripts  -Patient has lab only visit on 5/28 for GTT and labs. Anticipatory guidance reviewed  Preterm labor symptoms and general obstetric  precautions including but not limited to vaginal bleeding, contractions, leaking of fluid and fetal movement were reviewed in detail with the patient.  I discussed the assessment and treatment plan with the patient. The patient was provided an opportunity to ask questions and all were answered. The patient agreed with the plan and demonstrated an understanding of the instructions. The patient was advised to call back or seek an in-person office evaluation/go to MAU at [facility] for any urgent or concerning symptoms. Please refer to After Visit Summary for other counseling recommendations.   I provided 15 minutes of non-face-to-face time during this encounter.  Return in about 4 weeks (around 02/04/2019) for Return OB visit.  Future Appointments  Date Time Provider Department Center  01/14/2019  9:00 AM [facility] None  01/14/2019  9:15 AM Adam Phenix, MD [facility] None    Rolm Bookbinder, CNM Center for [employer], [facility]

## 2019-01-07 NOTE — Progress Notes (Signed)
Pt is on the phone preparing for webex visit with provider. [redacted]w[redacted]d.

## 2019-01-08 ENCOUNTER — Other Ambulatory Visit: Payer: Self-pay | Admitting: Obstetrics

## 2019-01-08 MED ORDER — BLOOD PRESSURE MONITOR KIT: 1.0000 | PACK | Freq: Every day | 0 refills | Status: DC

## 2019-01-14 ENCOUNTER — Other Ambulatory Visit: Payer: Self-pay

## 2019-01-14 ENCOUNTER — Ambulatory Visit (INDEPENDENT_AMBULATORY_CARE_PROVIDER_SITE_OTHER): Payer: Medicaid Other | Admitting: Obstetrics & Gynecology

## 2019-01-14 ENCOUNTER — Other Ambulatory Visit: Payer: Medicaid Other

## 2019-01-14 ENCOUNTER — Encounter: Payer: Self-pay | Admitting: Obstetrics & Gynecology

## 2019-01-14 DIAGNOSIS — Z3A27 27 weeks gestation of pregnancy: Secondary | ICD-10-CM

## 2019-01-14 DIAGNOSIS — Z3480 Encounter for supervision of other normal pregnancy, unspecified trimester: Secondary | ICD-10-CM

## 2019-01-14 DIAGNOSIS — Z148 Genetic carrier of other disease: Secondary | ICD-10-CM

## 2019-01-14 MED ORDER — PANTOPRAZOLE SODIUM 40 MG PO TBEC: 40.0000 mg | DELAYED_RELEASE_TABLET | Freq: Every day | ORAL | 1 refills | Status: DC

## 2019-01-14 NOTE — Progress Notes (Signed)
Pt states she has not received B/P Cuff  Declines T DAP

## 2019-01-14 NOTE — Patient Instructions (Signed)

## 2019-01-14 NOTE — Progress Notes (Signed)
   PRENATAL VISIT NOTE  Subjective:  Sue Graves is a 23 y.o. G4P1021 at [redacted]w[redacted]d being seen today for ongoing prenatal care.  She is currently monitored for the following issues for this high-risk pregnancy and has Tobacco abuse; Morning sickness; Trichimoniasis; Supervision of other normal pregnancy, antepartum; History of gestational hypertension; Insufficient prenatal care; GBS bacteriuria; and Carrier of genetic disorder on their problem list.  Patient reports heartburn.  Contractions: Not present. Vag. Bleeding: None.  Movement: Present. Denies leaking of fluid.   The following portions of the patient's history were reviewed and updated as appropriate: allergies, current medications, past family history, past medical history, past social history, past surgical history and problem list.   Objective:  There were no vitals filed for this visit.  Fetal Status:     Movement: Present     General:  Alert, oriented and cooperative. Patient is in no acute distress.  Skin: Skin is warm and dry. No rash noted.   Cardiovascular: Normal heart rate noted  Respiratory: Normal respiratory effort, no problems with respiration noted  Abdomen: Soft, gravid, appropriate for gestational age.  Pain/Pressure: Absent     Pelvic: Cervical exam deferred        Extremities: Normal range of motion.  Edema: None  Mental Status: Normal mood and affect. Normal behavior. Normal judgment and thought content.   Assessment and Plan:  Pregnancy: G4P1021 at [redacted]w[redacted]d 1. Supervision of other normal pregnancy, antepartum R/s labs, vomited her glucose load - CBC - HIV Antibody (routine testing w rflx) - RPR - pantoprazole (PROTONIX) 40 MG tablet; Take 1 tablet (40 mg total) by mouth daily.  Dispense: 30 tablet; Refill: 1  Preterm labor symptoms and general obstetric precautions including but not limited to vaginal bleeding, contractions, leaking of fluid and fetal movement were reviewed in detail with the patient. Please  refer to After Visit Summary for other counseling recommendations.   Return in about 4 weeks (around 02/11/2019) for not in person.  Future Appointments  Date Time Provider Department Center  01/18/2019  9:15 AM [facility] None  02/11/2019  2:30 PM Sharyon Cable, CNM [facility] None    Scheryl Darter, MD

## 2019-01-15 LAB — CBC
Hematocrit: 37.7 % (ref 34.0–46.6)
Hemoglobin: 13.2 g/dL (ref 11.1–15.9)
MCH: 32.4 pg (ref 26.6–33.0)
MCHC: 35 g/dL (ref 31.5–35.7)
MCV: 93 fL (ref 79–97)
Platelets: 275 10*3/uL (ref 150–450)
RBC: 4.07 x10E6/uL (ref 3.77–5.28)
RDW: 12.5 % (ref 11.7–15.4)
WBC: 7.4 10*3/uL (ref 3.4–10.8)

## 2019-01-15 LAB — RPR: RPR Ser Ql: NONREACTIVE

## 2019-01-15 LAB — HIV ANTIBODY (ROUTINE TESTING W REFLEX): HIV Screen 4th Generation wRfx: NONREACTIVE

## 2019-01-18 ENCOUNTER — Other Ambulatory Visit: Payer: Medicaid Other

## 2019-01-19 ENCOUNTER — Other Ambulatory Visit: Payer: Self-pay

## 2019-01-19 ENCOUNTER — Other Ambulatory Visit: Payer: Medicaid Other

## 2019-01-19 DIAGNOSIS — Z3480 Encounter for supervision of other normal pregnancy, unspecified trimester: Secondary | ICD-10-CM

## 2019-01-20 LAB — GLUCOSE TOLERANCE, 2 HOURS W/ 1HR
Glucose, 1 hour: 80 mg/dL (ref 65–179)
Glucose, 2 hour: 106 mg/dL (ref 65–152)
Glucose, Fasting: 66 mg/dL (ref 65–91)

## 2019-01-28 ENCOUNTER — Encounter (HOSPITAL_COMMUNITY): Payer: Self-pay | Admitting: *Deleted

## 2019-01-28 ENCOUNTER — Telehealth: Payer: Self-pay

## 2019-01-28 ENCOUNTER — Inpatient Hospital Stay (HOSPITAL_COMMUNITY)
Admission: AD | Admit: 2019-01-28 | Discharge: 2019-01-28 | Disposition: A | Discharge status: Home / Self Care | Payer: Medicaid Other | Attending: Obstetrics & Gynecology | Admitting: Obstetrics & Gynecology

## 2019-01-28 ENCOUNTER — Other Ambulatory Visit: Payer: Self-pay

## 2019-01-28 DIAGNOSIS — O26893 Other specified pregnancy related conditions, third trimester: Secondary | ICD-10-CM | POA: Diagnosis not present

## 2019-01-28 DIAGNOSIS — Z883 Allergy status to other anti-infective agents status: Secondary | ICD-10-CM | POA: Insufficient documentation

## 2019-01-28 DIAGNOSIS — R109 Unspecified abdominal pain: Secondary | ICD-10-CM

## 2019-01-28 DIAGNOSIS — Z87891 Personal history of nicotine dependence: Secondary | ICD-10-CM | POA: Diagnosis not present

## 2019-01-28 DIAGNOSIS — O26899 Other specified pregnancy related conditions, unspecified trimester: Secondary | ICD-10-CM

## 2019-01-28 DIAGNOSIS — Z3A29 29 weeks gestation of pregnancy: Secondary | ICD-10-CM

## 2019-01-28 LAB — URINALYSIS, ROUTINE W REFLEX MICROSCOPIC
Bilirubin Urine: NEGATIVE
Glucose, UA: NEGATIVE mg/dL
Hgb urine dipstick: NEGATIVE
Ketones, ur: NEGATIVE mg/dL
Nitrite: NEGATIVE
Protein, ur: 30 mg/dL — AB
Specific Gravity, Urine: 1.019 (ref 1.005–1.030)
pH: 8 (ref 5.0–8.0)

## 2019-01-28 LAB — WET PREP, GENITAL
Clue Cells Wet Prep HPF POC: NONE SEEN
Sperm: NONE SEEN
Trich, Wet Prep: NONE SEEN
Yeast Wet Prep HPF POC: NONE SEEN

## 2019-01-28 NOTE — MAU Note (Signed)
Presents with c/o lower abdominal pain. Denies VB or LOF.  Reports +FM.

## 2019-01-28 NOTE — MAU Provider Note (Signed)
Chief Complaint:  Abdominal Pain   First Provider Initiated Contact with Patient 01/28/19 1150     HPI: Sue Graves is a 23 y.o. T5V2023 at 18w0dwho presents to maternity admissions reporting abdominal cramping. Symptoms started yesterday. Reports it feels like a constant cramp & tightening. Denies n/v/d, dysuria, vaginal discharge, vaginal bleeding, or LOF. Good fetal movement. Reports being stressed recently d/t finding out her significant other has cheated. Would like STI testing.   Location: abdomen Quality: cramping Severity: 7/10 in pain scale Duration: 1 day Timing: constant Modifying factors: none Associated signs and symptoms: none  Past Medical History:  Diagnosis Date  . Asthma    as a child , no inhaler  . Depression    history - no meds  . Missed abortion    no surgery required  . Pregnancy induced hypertension    OB History  Gravida Para Term Preterm AB Living  '4 1 1   2 1  ' SAB TAB Ectopic Multiple Live Births  2     0 1    # Outcome Date GA Lbr Len/2nd Weight Sex Delivery Anes PTL Lv  4 Current           3 Term 03/19/17 324w6d3:39 / 00:42 3036 g M Vag-Spont EPI  LIV  2 SAB 04/2016 8w15w0d      1 SAB 03/2016 4w061w0d      Past Surgical History:  Procedure Laterality Date  . DILATION AND EVACUATION N/A 05/01/2016   Procedure: DILATATION AND EVACUATION for Retained Product;  Surgeon: UgonOsborne Oman;  [facility];  Service: Gynecology;  Laterality: N/A;  . WISDOM TOOTH EXTRACTION     Family History  Problem Relation Age of Onset  . Diabetes Maternal Aunt   . Healthy Mother   . Healthy Father    Social History   Tobacco Use  . Smoking status: Former Smoker    Packs/day: 0.25    Years: 3.00    Pack years: 0.75    Types: Cigarettes    Quit date: 12/17/2016    Years since quitting: 2.1  . Smokeless tobacco: Never Used  Substance Use Topics  . Alcohol use: Not Currently    Comment: occasionally   . Drug use: Not Currently    Types:  Marijuana    Comment: last used 01/27/2019   Allergies  Allergen Reactions  . Flagyl [Metronidazole] Rash  . Other Itching    Scented soaps/body wash  . Tomato Hives and Swelling   Medications Prior to Admission  Medication Sig Dispense Refill Last Dose  . aspirin EC 81 MG tablet Take 1 tablet (81 mg total) by mouth daily. Take after 12 weeks for prevention of preeclampsia later in pregnancy 300 tablet 2 Past Week at Unknown time  . Prenatal Vit-Fe Fumarate-FA (PRENATAL COMPLETE) 14-0.4 MG TABS Take 1 tablet by mouth daily. 60 each 0 01/26/2019 at 1400  . Blood Pressure Monitor KIT 1 each by Does not apply route daily. (Patient not taking: Reported on 01/14/2019) 1 each 0   . famotidine (PEPCID) 40 MG tablet Take 1 tablet (40 mg total) by mouth every evening. (Patient not taking: Reported on 01/14/2019) 30 tablet 1   . ondansetron (ZOFRAN ODT) 4 MG disintegrating tablet Take 1 tablet (4 mg total) by mouth every 8 (eight) hours as needed for nausea or vomiting. (Patient not taking: Reported on 11/05/2018) 20 tablet 1   . pantoprazole (PROTONIX) 40 MG  tablet Take 1 tablet (40 mg total) by mouth daily. 30 tablet 1   . promethazine (PHENERGAN) 25 MG tablet Take 0.5-1 tablets (12.5-25 mg total) by mouth every 6 (six) hours as needed for nausea. (Patient not taking: Reported on 11/05/2018) 30 tablet 2     I have reviewed patient's Past Medical Hx, Surgical Hx, Family Hx, Social Hx, medications and allergies.   ROS:  Review of Systems  Constitutional: Negative.   Gastrointestinal: Positive for abdominal pain. Negative for constipation, diarrhea, nausea and vomiting.  Genitourinary: Negative.     Physical Exam   Patient Vitals for the past 24 hrs:  BP Temp Temp src Pulse Resp SpO2  01/28/19 1133 125/74 98.4 F (36.9 C) Oral 81 20 100 %    Constitutional: Well-developed, well-nourished female in no acute distress.  Cardiovascular: normal rate & rhythm, no murmur Respiratory: normal effort,  lung sounds clear throughout GI: Abd soft, non-tender, gravid appropriate for gestational age. Pos BS x 4 MS: Extremities nontender, no edema, normal ROM Neurologic: Alert and oriented x 4.  GU:   Dilation: Closed Effacement (%): Thick Cervical Position: Posterior Exam by:: Almond Lint NP  NST:  Baseline: 140 bpm, Variability: Good {> 6 bpm), Accelerations: Non-reactive but appropriate for gestational age and Decelerations: Absent   Labs: Results for orders placed or performed during the hospital encounter of 01/28/19 (from the past 24 hour(s))  Urinalysis, Routine w reflex microscopic     Status: Abnormal   Collection Time: 01/28/19 11:45 AM  Result Value Ref Range   Color, Urine YELLOW YELLOW   APPearance CLOUDY (A) CLEAR   Specific Gravity, Urine 1.019 1.005 - 1.030   pH 8.0 5.0 - 8.0   Glucose, UA NEGATIVE NEGATIVE mg/dL   Hgb urine dipstick NEGATIVE NEGATIVE   Bilirubin Urine NEGATIVE NEGATIVE   Ketones, ur NEGATIVE NEGATIVE mg/dL   Protein, ur 30 (A) NEGATIVE mg/dL   Nitrite NEGATIVE NEGATIVE   Leukocytes,Ua TRACE (A) NEGATIVE   RBC / HPF 0-5 0 - 5 RBC/hpf   WBC, UA 6-10 0 - 5 WBC/hpf   Bacteria, UA FEW (A) NONE SEEN   Squamous Epithelial / LPF 11-20 0 - 5   Mucus PRESENT    Amorphous Crystal PRESENT   Wet prep, genital     Status: Abnormal   Collection Time: 01/28/19 12:10 PM   Specimen: Vaginal  Result Value Ref Range   Yeast Wet Prep HPF POC NONE SEEN NONE SEEN   Trich, Wet Prep NONE SEEN NONE SEEN   Clue Cells Wet Prep HPF POC NONE SEEN NONE SEEN   WBC, Wet Prep HPF POC MANY (A) NONE SEEN   Sperm NONE SEEN     Imaging:  No results found.  MAU Course: Orders Placed This Encounter  Procedures  . Wet prep, genital  . Culture, OB Urine  . Urinalysis, Routine w reflex microscopic  . Discharge patient   No orders of the defined types were placed in this encounter.   MDM: Category 1 tracing. Some UI on monitor. Cervix closed/thick.  Wet prep  negative UI decreased with PO fluids & crackers. Pt had not had anything to eat or drink today.   Assessment: 1. Abdominal cramping affecting pregnancy   2. [redacted] weeks gestation of pregnancy     Plan: Discharge home in stable condition.  Preterm Labor precautions and fetal kick counts  Urine culture & GC/CT pending  Allergies as of 01/28/2019      Reactions   Flagyl [  metronidazole] Rash   Other Itching   Scented soaps/body wash   Tomato Hives, Swelling      Medication List    STOP taking these medications   famotidine 40 MG tablet Commonly known as: PEPCID   ondansetron 4 MG disintegrating tablet Commonly known as: Zofran ODT   promethazine 25 MG tablet Commonly known as: PHENERGAN     TAKE these medications   aspirin EC 81 MG tablet Take 1 tablet (81 mg total) by mouth daily. Take after 12 weeks for prevention of preeclampsia later in pregnancy   Blood Pressure Monitor Kit 1 each by Does not apply route daily.   pantoprazole 40 MG tablet Commonly known as: Protonix Take 1 tablet (40 mg total) by mouth daily.   Prenatal Complete 14-0.4 MG Tabs Take 1 tablet by mouth daily.       Jorje Guild, NP 01/28/2019 11:50 AM

## 2019-01-28 NOTE — Discharge Instructions (Signed)
Warning Signs During Pregnancy  A pregnancy lasts about 40 weeks, starting from the first day of your last period until the baby is born. Pregnancy is divided into three phases called trimesters.  · The first trimester refers to week 1 through week 13 of pregnancy.  · The second trimester is the start of week 14 through the end of week 27.  · The third trimester is the start of week 28 until you deliver your baby.  During each trimester of pregnancy, certain signs and symptoms may indicate a problem. Talk with your health care provider about your current health and any medical conditions you have. Make sure you know the symptoms that you should watch for and report.  How does this affect me?    Warning signs in the first trimester  While some changes during the first trimester may be uncomfortable, most do not represent a serious problem. Let your health care provider know if you have any of the following warning signs in the first trimester:  · You cannot eat or drink without vomiting, and this lasts for longer than a day.  · You have vaginal bleeding or spotting along with menstrual-like cramping.  · You have diarrhea for longer than a day.  · You have a fever or other signs of infection, such as:  ? Pain or burning when you urinate.  ? Foul smelling or thick or yellowish vaginal discharge.  Warning signs in the second trimester  As your baby grows and changes during the second trimester, there are additional signs and symptoms that may indicate a problem. These include:  · Signs and symptoms of infection, including a fever.  · Signs or symptoms of a miscarriage or preterm labor, such as regular contractions, menstrual-like cramping, or lower abdominal pain.  · Bloody or watery vaginal discharge or obvious vaginal bleeding.  · Feeling like your heart is pounding.  · Having trouble breathing.  · Nausea, vomiting, or diarrhea that lasts for longer than a day.  · Craving non-food items, such as clay, chalk, or dirt.  This may be a sign of a very treatable medical condition called pica.  Later in your second trimester, watch for signs and symptoms of a serious medical condition called preeclampsia.These include:  · Changes in your vision.  · A severe headache that does not go away.  · Nausea and vomiting.  It is also important to notice if your baby stops moving or moves less than usual during this time.  Warning signs in the third trimester  As you approach the third trimester, your baby is growing and your body is preparing for the birth of your baby. In your third trimester, be sure to let your health care provider know if:  · You have signs and symptoms of infection, including a fever.  · You have vaginal bleeding.  · You notice that your baby is moving less than usual or is not moving.  · You have nausea, vomiting, or diarrhea that lasts for longer than a day.  · You have a severe headache that does not go away.  · You have vision changes, including seeing spots or having blurry or double vision.  · You have increased swelling in your hands or face.  How does this affect my baby?  Throughout your pregnancy, always report any of the warning signs of a problem to your health care provider. This can help prevent complications that may affect your baby, including:  · Increased risk   for premature birth.  · Infection that may be transmitted to your baby.  · Increased risk for stillbirth.  Contact a health care provider if:  · You have any of the warning signs of a problem for the current trimester of your pregnancy.  · Any of the following apply to you during any trimester of pregnancy:  ? You have strong emotions, such as sadness or anxiety, that interfere with work or personal relationships.  ? You feel unsafe in your home and need help finding a safe place to live.  ? You are using tobacco products, alcohol, or drugs and you need help to stop.  Get help right away if:  You have signs or symptoms of labor before 37 weeks of  pregnancy. These include:  · Contractions that are 5 minutes or less apart, or that increase in frequency, intensity, or length.  · Sudden, sharp abdominal pain or low back pain.  · Uncontrolled gush or trickle of fluid from your vagina.  Summary  · A pregnancy lasts about 40 weeks, starting from the first day of your last period until the baby is born. Pregnancy is divided into three phases called trimesters. Each trimester has warning signs to watch for.  · Always report any warning signs to your health care provider in order to prevent complications that may affect both you and your baby.  · Talk with your health care provider about your current health and any medical conditions you have. Make sure you know the symptoms that you should watch for and report.  This information is not intended to replace advice given to you by your health care provider. Make sure you discuss any questions you have with your health care provider.  Document Released: 05/22/2017 Document Revised: 05/22/2017 Document Reviewed: 05/22/2017  Elsevier Interactive Patient Education © 2019 [employer]

## 2019-01-28 NOTE — Telephone Encounter (Signed)
Pt called in stating that she has been extremely stressed for the last few days, has not been taking her medicine, and barely eating and drinking. Pt reports feeling lightheaded, and cramping, pelvic pressure since yesterday. Pt took BP on the phone, reports 140/100. Pt also reports vaginal irritation. Advised pt to be evaluated at the hospital, pt agreed.

## 2019-01-29 LAB — CULTURE, OB URINE

## 2019-01-29 LAB — GC/CHLAMYDIA PROBE AMP (~~LOC~~) NOT AT ARMC
Chlamydia: NEGATIVE
Neisseria Gonorrhea: NEGATIVE

## 2019-02-02 ENCOUNTER — Other Ambulatory Visit (HOSPITAL_COMMUNITY): Payer: Self-pay | Admitting: *Deleted

## 2019-02-02 ENCOUNTER — Other Ambulatory Visit: Payer: Self-pay

## 2019-02-02 ENCOUNTER — Ambulatory Visit (HOSPITAL_COMMUNITY)
Admission: RE | Admit: 2019-02-02 | Discharge: 2019-02-02 | Disposition: A | Discharge status: Home / Self Care | Payer: Medicaid Other | Source: Ambulatory Visit | Attending: Obstetrics and Gynecology | Admitting: Obstetrics and Gynecology

## 2019-02-02 DIAGNOSIS — Z3689 Encounter for other specified antenatal screening: Secondary | ICD-10-CM

## 2019-02-02 DIAGNOSIS — Z362 Encounter for other antenatal screening follow-up: Secondary | ICD-10-CM | POA: Diagnosis not present

## 2019-02-02 DIAGNOSIS — Z3A29 29 weeks gestation of pregnancy: Secondary | ICD-10-CM

## 2019-02-11 ENCOUNTER — Other Ambulatory Visit: Payer: Self-pay

## 2019-02-11 ENCOUNTER — Encounter: Payer: Self-pay | Admitting: Obstetrics

## 2019-02-11 ENCOUNTER — Ambulatory Visit (INDEPENDENT_AMBULATORY_CARE_PROVIDER_SITE_OTHER): Payer: Medicaid Other | Admitting: Obstetrics

## 2019-02-11 VITALS — BP 129/79 | HR 90

## 2019-02-11 DIAGNOSIS — Z148 Genetic carrier of other disease: Secondary | ICD-10-CM

## 2019-02-11 DIAGNOSIS — Z3483 Encounter for supervision of other normal pregnancy, third trimester: Secondary | ICD-10-CM

## 2019-02-11 DIAGNOSIS — Z3480 Encounter for supervision of other normal pregnancy, unspecified trimester: Secondary | ICD-10-CM

## 2019-02-11 DIAGNOSIS — R8271 Bacteriuria: Secondary | ICD-10-CM

## 2019-02-11 DIAGNOSIS — Z3A31 31 weeks gestation of pregnancy: Secondary | ICD-10-CM

## 2019-02-11 DIAGNOSIS — Z8759 Personal history of other complications of pregnancy, childbirth and the puerperium: Secondary | ICD-10-CM

## 2019-02-11 NOTE — Progress Notes (Signed)
TELEHEALTH OBSTETRICS PRENATAL VIRTUAL VIDEO VISIT ENCOUNTER NOTE  [facility]   I connected with Sue Kosakayla Meara on 02/11/19 at  2:30 PM EDT by WebEx OB MyChart Video Encounter at home and verified that I am speaking with the correct person using two identifiers.   I discussed the limitations, risks, security and privacy concerns of performing an evaluation and management service by telephone and the availability of in person appointments. I also discussed with the patient that there may be a patient responsible charge related to this service. The patient expressed understanding and agreed to proceed. Subjective:  Sue Graves is a 23 y.o. [id number] at 7337w0d being seen today for ongoing prenatal care.  She is currently monitored for the following issues for this low-risk pregnancy and has Tobacco abuse; Morning sickness; Trichimoniasis; Supervision of other normal pregnancy, antepartum; History of gestational hypertension; Insufficient prenatal care; GBS bacteriuria; and Carrier of genetic disorder on their problem list.  Patient reports pelvic pressure.  Contractions: Not present. Vag. Bleeding: None.  Movement: Present. Denies any leaking of fluid.   The following portions of the patient's history were reviewed and updated as appropriate: allergies, current medications, past family history, past medical history, past social history, past surgical history and problem list.   Objective:   Vitals:   02/11/19 1444  BP: 129/79  Pulse: 90    Fetal Status:     Movement: Present     General:  Alert, oriented and cooperative. Patient is in no acute distress.  Respiratory: Normal respiratory effort, no problems with respiration noted  Mental Status: Normal mood and affect. Normal behavior. Normal judgment and thought content.  Rest of physical exam deferred due to type of encounter  Imaging: [place] [facility] Ob Follow Up  Result Date: 02/02/2019  ----------------------------------------------------------------------  OBSTETRICS REPORT                       (Signed Final 02/02/2019 03:19 pm) ---------------------------------------------------------------------- Patient Info  ID #:       [id number]                          D.O.B.:  1996/01/16 (22 yrs)  Name:       Sue KosAKAYLA Rushing                    Visit Date: 02/02/2019 01:21 pm ---------------------------------------------------------------------- Performed By  Performed By:     Percell BostonHeather Waken          Ref. Address:      [address]                    [address]                                                              [address]                                                              [phone]  [place]                                                              [phone]  Attending:        Noralee Spaceavi Shankar MD        [facility]  [facility]      [facility] ---------------------------------------------------------------------- Orders   #  Description                          Code         Ordered By   1  US MFM OB FOLLOW UP                  [id number]     Lin LandsmanORENTHIAN                                                        BOOKER  ----------------------------------------------------------------------   #  Order #                    Accession #                 Episode #   1  [id number]                  [id number]                  [id number]  ---------------------------------------------------------------------- Indications   [redacted] weeks gestation of pregnancy                Z3A.29   Encounter for other antenatal screening        Z36.2   follow-up (no testing done)  ---------------------------------------------------------------------- Fetal Evaluation  Num Of Fetuses:          1  Fetal Heart Rate(bpm):   133  Cardiac Activity:        Observed  Presentation:             Cephalic  Placenta:                Posterior  P. Cord Insertion:       Previously Visualized  Amniotic Fluid  AFI FV:      Within normal limits  AFI Sum(cm)     %Tile       Largest Pocket(cm)  12.24           31          3.64  RUQ(cm)       RLQ(cm)       LUQ(cm)        LLQ(cm)  3.6  1.57          3.43           3.64 ---------------------------------------------------------------------- Biometry  BPD:      71.6  mm     G. Age:  28w 5d         13  %    CI:            79  %    70 - 86                                                          FL/HC:       20.2  %    19.2 - 21.4  HC:      254.7  mm     G. Age:  27w 5d        < 3  %    HC/AC:       1.02       0.99 - 1.21  AC:      249.1  mm     G. Age:  29w 1d         28  %    FL/BPD:      71.8  %    71 - 87  FL:       51.4  mm     G. Age:  27w 3d        < 3  %    FL/AC:       20.6  %    20 - 24  HUM:      47.2  mm     G. Age:  27w 5d         10  %  Est. FW:    1224   gm   2 lb 11 oz      26  % ---------------------------------------------------------------------- OB History  Gravidity:    4         Term:   1        Prem:   0        SAB:   2  TOP:          0       Ectopic:  0        Living: 1 ---------------------------------------------------------------------- Gestational Age  LMP:           31w 1d        Date:  06/29/18                 EDD:   04/05/19  U/S Today:     28w 2d                                        EDD:   04/25/19  Best:          29w 5d     Det. By:  U/S  (11/16/18)          EDD:   04/15/19 ---------------------------------------------------------------------- Anatomy  Cranium:               Appears normal         Aortic Arch:  Previously seen  Cavum:                 Previously seen        Ductal Arch:            Previously seen  Ventricles:            Appears normal         Diaphragm:              Previously seen  Choroid Plexus:        Previously seen        Stomach:                Appears normal, left                                                                         sided  Cerebellum:            Previously seen        Abdomen:                Previously seen  Posterior Fossa:       Previously seen        Abdominal Wall:         Previously seen  Nuchal Fold:           Not well visualized    Cord Vessels:           Previously seen  Face:                  Orbits and profile     Kidneys:                Appear normal                         previously seen  Lips:                  Previously seen        Bladder:                Appears normal  Thoracic:              Appears normal         Spine:                  Previously seen  Heart:                 Not well visualized    Upper Extremities:      Previously seen  RVOT:                  Appears normal         Lower Extremities:      Previously seen  LVOT:                  Appears normal  Other:  Fetus appears to be a female. Heels and 5th digit visualized previously. ---------------------------------------------------------------------- Cervix Uterus Adnexa  Cervix  Not visualized (advanced GA >24wks)  Uterus  No abnormality visualized.  Left Ovary  No adnexal mass visualized.  Right Ovary  No adnexal mass visualized.  Cul De Sac  No free fluid seen.  Adnexa  No abnormality visualized. ---------------------------------------------------------------------- Impression  Patient returned for completion of fetal anatomy. Fetal growth  is appropriate for gestational age. HC measurement is at  between -2 and -1 SD (normal). Amniotic fluid is normal and  good fetal activity is seen. Fetal anatomical survey was  completed and appears normal. ---------------------------------------------------------------------- Recommendations  Follow-up fetal growth assessment in 6 weeks. ----------------------------------------------------------------------                  Tama High, MD Electronically Signed Final Report   02/02/2019 03:19 pm ----------------------------------------------------------------------    Assessment and Plan:  Pregnancy: P5T6144 at [redacted]w[redacted]d  1. Supervision of other normal pregnancy, antepartum  2. 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 History of gestational hypertension - taking Baby ASA  4. GBS bacteriuria - treat in labor  Preterm labor symptoms and general obstetric precautions including but not limited to vaginal bleeding, contractions, leaking of fluid and fetal movement were reviewed in detail with the patient. I discussed the assessment and treatment plan with the patient. The patient was provided an opportunity to ask questions and all were answered. The patient agreed with the plan and demonstrated an understanding of the instructions. The patient was advised to call back or seek an in-person office evaluation/go to [facility] at [facility] for any urgent or concerning symptoms. Please refer to After Visit Summary for other counseling recommendations.   I provided 10 minutes of face-to-face time during this encounter.    Future Appointments  Date Time Provider [place]  03/16/2019  1:15 PM [facility]-MFC [place] 4 [facility]-MFCUS MFC-US    Yosselin Zoeller, MD [facility] 02-11-2019

## 2019-02-11 NOTE — Progress Notes (Signed)
S/w pt for webex visit. Pt reports fetal movement with pressure.

## 2019-02-25 ENCOUNTER — Ambulatory Visit (INDEPENDENT_AMBULATORY_CARE_PROVIDER_SITE_OTHER): Payer: Medicaid Other | Admitting: Family Medicine

## 2019-02-25 ENCOUNTER — Telehealth: Payer: Self-pay

## 2019-02-25 VITALS — BP 127/79 | HR 86

## 2019-02-25 DIAGNOSIS — Z3A33 33 weeks gestation of pregnancy: Secondary | ICD-10-CM

## 2019-02-25 DIAGNOSIS — Z3480 Encounter for supervision of other normal pregnancy, unspecified trimester: Secondary | ICD-10-CM

## 2019-02-25 DIAGNOSIS — Z3483 Encounter for supervision of other normal pregnancy, third trimester: Secondary | ICD-10-CM

## 2019-02-25 DIAGNOSIS — Z8759 Personal history of other complications of pregnancy, childbirth and the puerperium: Secondary | ICD-10-CM

## 2019-02-25 NOTE — Progress Notes (Signed)
   [place] VIRTUAL VIDEO VISIT ENCOUNTER NOTE Provider location: [facility] for [place] at [place]   I connected with Sue Graves on 02/25/19 at 10:30 AM EDT by WebEx and Telephone Encounter at home and verified that I am speaking with the correct person using two identifiers.   I discussed the limitations, risks, security and privacy concerns of performing an evaluation and management service virtually and the availability of in person appointments. I also discussed with the patient that there may be a patient responsible charge related to this service. The patient expressed understanding and agreed to proceed.  Subjective:  Sue Graves is a 23 y.o. [id number] at [redacted]w[redacted]d being seen today for ongoing prenatal care.  She is currently monitored for the following issues for this low-risk pregnancy and has Tobacco abuse; Morning sickness; Trichimoniasis; Supervision of other normal pregnancy, antepartum; History of gestational hypertension; Insufficient prenatal care; GBS bacteriuria; and Carrier of genetic disorder on their problem list.  Patient reports occasional contractions. Contractions come/go, usually get better with rest. Happening last couple of weeks.  Contractions: Irregular. Vag. Bleeding: None.  Movement: Present. Denies any leaking of fluid.   The following portions of the patient's history were reviewed and updated as appropriate: allergies, current medications, past family history, past medical history, past social history, past surgical history and problem list.   Objective:   Vitals:   02/25/19 1030  BP: 127/79  Pulse: 86   Fetal Status:     Movement: Present     General:  Alert, oriented and cooperative. Patient is in no acute distress.  Respiratory: Normal respiratory effort, no problems with respiration noted  Mental Status: Normal mood and affect. Normal behavior. Normal judgment and thought content.  Rest of physical exam deferred due to type  of encounter   Assessment and Plan:  Pregnancy: [id number] at [redacted]w[redacted]d  Supervision of Pregnancy -- counseled on return precautions for contractions -- advised to continue to check BP, reviewed signs/symptoms of preeclampsia -- continue PNV, ASA -- discussed GBS testing at next visit   Preterm labor symptoms and general obstetric precautions including but not limited to vaginal bleeding, contractions, leaking of fluid and fetal movement were reviewed in detail with the patient. I discussed the assessment and treatment plan with the patient. The patient was provided an opportunity to ask questions and all were answered. The patient agreed with the plan and demonstrated an understanding of the instructions. The patient was advised to call back or seek an in-person office evaluation/go to MAU at [facility] for any urgent or concerning symptoms. Please refer to After Visit Summary for other counseling recommendations.   I provided 10 minutes of face-to-face time during this encounter.  Return in about 3 weeks (around 03/18/2019) for routine 36-week visit .  Future Appointments  Date Time Provider [place]  03/16/2019  1:15 PM [place] 4 WH-MFCUS MFC-US  03/18/2019 11:00 AM Shelly Bombard, MD [place] None    [place] for [facility]

## 2019-02-25 NOTE — Progress Notes (Signed)
Pt states she is having some irregular ctx.

## 2019-02-25 NOTE — Telephone Encounter (Signed)
Called pt back to see if she was having trouble connecting to webex, no answer, left vm.

## 2019-03-16 ENCOUNTER — Ambulatory Visit (HOSPITAL_COMMUNITY): Payer: No Typology Code available for payment source

## 2019-03-16 ENCOUNTER — Encounter (HOSPITAL_COMMUNITY): Payer: Self-pay

## 2019-03-18 ENCOUNTER — Encounter: Payer: Medicaid Other | Admitting: Obstetrics

## 2019-03-23 ENCOUNTER — Encounter: Payer: Medicaid Other | Admitting: Medical

## 2019-03-23 ENCOUNTER — Telehealth: Payer: Self-pay | Admitting: *Deleted

## 2019-03-23 NOTE — Telephone Encounter (Signed)
Left voice message for patient to clinic back in the next 10 minutes for appointment or call to reschedule.  Derl Barrow, RN

## 2019-03-25 ENCOUNTER — Ambulatory Visit (INDEPENDENT_AMBULATORY_CARE_PROVIDER_SITE_OTHER): Payer: Medicaid Other | Admitting: Obstetrics

## 2019-03-25 ENCOUNTER — Telehealth: Payer: Self-pay

## 2019-03-25 ENCOUNTER — Encounter: Payer: Self-pay | Admitting: Obstetrics

## 2019-03-25 DIAGNOSIS — Z3483 Encounter for supervision of other normal pregnancy, third trimester: Secondary | ICD-10-CM

## 2019-03-25 DIAGNOSIS — Z3A37 37 weeks gestation of pregnancy: Secondary | ICD-10-CM

## 2019-03-25 DIAGNOSIS — Z3480 Encounter for supervision of other normal pregnancy, unspecified trimester: Secondary | ICD-10-CM

## 2019-03-25 NOTE — Progress Notes (Signed)
S/w pt for virtual visit, pt reports fetal movement with irregular contractions. 

## 2019-03-25 NOTE — Progress Notes (Signed)
   [place] VIRTUAL VIDEO VISIT ENCOUNTER NOTE  Provider location: [facility] for [place] at [place]   I connected with Sue Graves on 03/25/19 at 11:00 AM EDT by WebEx OB MyChart Video Encounter at home and verified that I am speaking with the correct person using two identifiers.   I discussed the limitations, risks, security and privacy concerns of performing an evaluation and management service virtually and the availability of in person appointments. I also discussed with the patient that there may be a patient responsible charge related to this service. The patient expressed understanding and agreed to proceed. Subjective:  Sue Graves is a 23 y.o. [id number] at [redacted]w[redacted]d being seen today for ongoing prenatal care.  She is currently monitored for the following issues for this low-risk pregnancy and has Tobacco abuse; Morning sickness; Trichimoniasis; Supervision of other normal pregnancy, antepartum; History of gestational hypertension; Insufficient prenatal care; GBS bacteriuria; and Carrier of genetic disorder on their problem list.  Patient reports no complaints.  Contractions: Irregular. Vag. Bleeding: None.  Movement: Present. Denies any leaking of fluid.   The following portions of the patient's history were reviewed and updated as appropriate: allergies, current medications, past family history, past medical history, past social history, past surgical history and problem list.   Objective:   Vitals:   03/25/19 1129  BP: 132/88  Pulse: (!) 102    Fetal Status:     Movement: Present     General:  Alert, oriented and cooperative. Patient is in no acute distress.  Respiratory: Normal respiratory effort, no problems with respiration noted  Mental Status: Normal mood and affect. Normal behavior. Normal judgment and thought content.  Rest of physical exam deferred due to type of encounter  Imaging: No results found.  Assessment and Plan:  Pregnancy:  G4P1021 at [redacted]w[redacted]d 1. Supervision of other normal pregnancy, antepartum   Term labor symptoms and general obstetric precautions including but not limited to vaginal bleeding, contractions, leaking of fluid and fetal movement were reviewed in detail with the patient. I discussed the assessment and treatment plan with the patient. The patient was provided an opportunity to ask questions and all were answered. The patient agreed with the plan and demonstrated an understanding of the instructions. The patient was advised to call back or seek an in-person office evaluation/go to MAU at [facility] for any urgent or concerning symptoms. Please refer to After Visit Summary for other counseling recommendations.   I provided 10 minutes of face-to-face time during this encounter.  Return in about 1 week (around 04/01/2019) for WebEx.    Baltazar Najjar, MD Center for [facility] [place] [facility] 03-25-2019

## 2019-03-25 NOTE — Telephone Encounter (Signed)
Called pt for virtual visit, no answer, left vm to call

## 2019-04-01 ENCOUNTER — Encounter: Payer: Self-pay | Admitting: Obstetrics

## 2019-04-01 ENCOUNTER — Ambulatory Visit (INDEPENDENT_AMBULATORY_CARE_PROVIDER_SITE_OTHER): Payer: Medicaid Other | Admitting: Obstetrics

## 2019-04-01 VITALS — BP 131/95 | HR 86

## 2019-04-01 DIAGNOSIS — R8271 Bacteriuria: Secondary | ICD-10-CM

## 2019-04-01 DIAGNOSIS — Z3483 Encounter for supervision of other normal pregnancy, third trimester: Secondary | ICD-10-CM

## 2019-04-01 DIAGNOSIS — O285 Abnormal chromosomal and genetic finding on antenatal screening of mother: Secondary | ICD-10-CM

## 2019-04-01 DIAGNOSIS — Z3A38 38 weeks gestation of pregnancy: Secondary | ICD-10-CM

## 2019-04-01 DIAGNOSIS — Z3480 Encounter for supervision of other normal pregnancy, unspecified trimester: Secondary | ICD-10-CM

## 2019-04-01 DIAGNOSIS — Z8759 Personal history of other complications of pregnancy, childbirth and the puerperium: Secondary | ICD-10-CM

## 2019-04-01 NOTE — Progress Notes (Signed)
I connected with Sue Graves on 04/01/19 at 11:00 AM EDT by telephone and verified that I am speaking with the correct person using two identifiers.  Pt declined virtual visit today. She vented about having virtual visits throughout her pregnancy and being scheduled with female providers after stating several times, she wants a female. No female providers available in the office at this time. I voiced understanding about pt's concerns and offered a telephone visit versus virtual but pt declined. Offered to schedule visit with a female provider tomorrow or earlier next week but pt declined.

## 2019-04-01 NOTE — Progress Notes (Addendum)
   [place] VIRTUAL VIDEO VISIT ENCOUNTER NOTE  Provider location: [facility] for [place] at [place]   I connected with Sue Graves on 04/01/19 at 11:00 AM EDT by WebEx OB MyChart Video Encounter at home and verified that I am speaking with the correct person using two identifiers.   I discussed the limitations, risks, security and privacy concerns of performing an evaluation and management service virtually and the availability of in person appointments. I also discussed with the patient that there may be a patient responsible charge related to this service. The patient expressed understanding and agreed to proceed. Subjective:  Sue Graves is a 23 y.o. [id number] at [redacted]w[redacted]d being seen today for ongoing prenatal care.  She is currently monitored for the following issues for this high-risk pregnancy and has Tobacco abuse; Morning sickness; Trichimoniasis; Supervision of other normal pregnancy, antepartum; History of gestational hypertension; Insufficient prenatal care; GBS bacteriuria; and Carrier of genetic disorder on their problem list.  Patient reports pelvic pressure.  Contractions: Irregular. Vag. Bleeding: None.  Movement: Present. Denies any leaking of fluid.   The following portions of the patient's history were reviewed and updated as appropriate: allergies, current medications, past family history, past medical history, past social history, past surgical history and problem list.   Objective:   Vitals:   04/01/19 1100  BP: (!) 131/95  Pulse: 86    Fetal Status:     Movement: Present     General:  Alert, oriented and cooperative. Patient is in no acute distress.  Respiratory: Normal respiratory effort, no problems with respiration noted  Mental Status: Normal mood and affect. Normal behavior. Normal judgment and thought content.  Rest of physical exam deferred due to type of encounter  Imaging: No results found.  Assessment and Plan:  Pregnancy:  G4P1021 at [redacted]w[redacted]d  1. Supervision of other normal pregnancy, antepartum  2. History of gestational hypertension - taking Baby ASA  3. GBS bacteriuria - treat in labor  4. Abnormal genetic test during pregnancy   Term labor symptoms and general obstetric precautions including but not limited to vaginal bleeding, contractions, leaking of fluid and fetal movement were reviewed in detail with the patient. I discussed the assessment and treatment plan with the patient. The patient was provided an opportunity to ask questions and all were answered. The patient agreed with the plan and demonstrated an understanding of the instructions. The patient was advised to call back or seek an in-person office evaluation/go to MAU at [facility] for any urgent or concerning symptoms. Please refer to After Visit Summary for other counseling recommendations.   I provided 10 minutes of face-to-face time during this encounter.    Future Appointments  Date Time Provider [place]  04/02/2019 11:25 AM [place], Rona Ravens, NP [place] None    Baltazar Najjar, [place] for [facility] [place] [facility] 04/01/2019

## 2019-04-02 ENCOUNTER — Encounter: Payer: Medicaid Other | Admitting: Nurse Practitioner

## 2019-04-06 ENCOUNTER — Encounter: Payer: Medicaid Other | Admitting: Obstetrics and Gynecology

## 2019-04-07 ENCOUNTER — Telehealth: Payer: Self-pay | Admitting: Obstetrics and Gynecology

## 2019-04-13 ENCOUNTER — Inpatient Hospital Stay (HOSPITAL_COMMUNITY)
Admission: AD | Admit: 2019-04-13 | Discharge: 2019-04-15 | DRG: 807 | Disposition: A | Discharge status: Home / Self Care | Payer: Medicaid Other | Attending: Family Medicine | Admitting: Family Medicine

## 2019-04-13 ENCOUNTER — Inpatient Hospital Stay (HOSPITAL_COMMUNITY): Payer: Medicaid Other | Admitting: Anesthesiology

## 2019-04-13 ENCOUNTER — Encounter (HOSPITAL_COMMUNITY): Payer: Self-pay | Admitting: *Deleted

## 2019-04-13 ENCOUNTER — Other Ambulatory Visit: Payer: Self-pay

## 2019-04-13 DIAGNOSIS — O139 Gestational [pregnancy-induced] hypertension without significant proteinuria, unspecified trimester: Secondary | ICD-10-CM | POA: Diagnosis present

## 2019-04-13 DIAGNOSIS — Z20828 Contact with and (suspected) exposure to other viral communicable diseases: Secondary | ICD-10-CM | POA: Diagnosis present

## 2019-04-13 DIAGNOSIS — O210 Mild hyperemesis gravidarum: Secondary | ICD-10-CM

## 2019-04-13 DIAGNOSIS — Z87891 Personal history of nicotine dependence: Secondary | ICD-10-CM

## 2019-04-13 DIAGNOSIS — Z3A39 39 weeks gestation of pregnancy: Secondary | ICD-10-CM

## 2019-04-13 DIAGNOSIS — O99824 Streptococcus B carrier state complicating childbirth: Secondary | ICD-10-CM | POA: Diagnosis present

## 2019-04-13 DIAGNOSIS — R8271 Bacteriuria: Secondary | ICD-10-CM

## 2019-04-13 DIAGNOSIS — O26893 Other specified pregnancy related conditions, third trimester: Secondary | ICD-10-CM | POA: Diagnosis present

## 2019-04-13 DIAGNOSIS — Z3480 Encounter for supervision of other normal pregnancy, unspecified trimester: Secondary | ICD-10-CM

## 2019-04-13 DIAGNOSIS — O4290 Premature rupture of membranes, unspecified as to length of time between rupture and onset of labor, unspecified weeks of gestation: Secondary | ICD-10-CM | POA: Diagnosis present

## 2019-04-13 DIAGNOSIS — Z8759 Personal history of other complications of pregnancy, childbirth and the puerperium: Secondary | ICD-10-CM

## 2019-04-13 DIAGNOSIS — O134 Gestational [pregnancy-induced] hypertension without significant proteinuria, complicating childbirth: Secondary | ICD-10-CM | POA: Diagnosis present

## 2019-04-13 DIAGNOSIS — Z8709 Personal history of other diseases of the respiratory system: Secondary | ICD-10-CM

## 2019-04-13 LAB — CBC
HCT: 43.2 % (ref 36.0–46.0)
Hemoglobin: 14.7 g/dL (ref 12.0–15.0)
MCH: 32 pg (ref 26.0–34.0)
MCHC: 34 g/dL (ref 30.0–36.0)
MCV: 94.1 fL (ref 80.0–100.0)
Platelets: 283 10*3/uL (ref 150–400)
RBC: 4.59 MIL/uL (ref 3.87–5.11)
RDW: 13.3 % (ref 11.5–15.5)
WBC: 9 10*3/uL (ref 4.0–10.5)
nRBC: 0 % (ref 0.0–0.2)

## 2019-04-13 LAB — COMPREHENSIVE METABOLIC PANEL
ALT: 13 U/L (ref 0–44)
AST: 19 U/L (ref 15–41)
Albumin: 3 g/dL — ABNORMAL LOW (ref 3.5–5.0)
Alkaline Phosphatase: 147 U/L — ABNORMAL HIGH (ref 38–126)
Anion gap: 12 (ref 5–15)
BUN: 7 mg/dL (ref 6–20)
CO2: 17 mmol/L — ABNORMAL LOW (ref 22–32)
Calcium: 9.4 mg/dL (ref 8.9–10.3)
Chloride: 106 mmol/L (ref 98–111)
Creatinine, Ser: 0.87 mg/dL (ref 0.44–1.00)
GFR calc Af Amer: >60 mL/min (ref >60)
GFR calc non Af Amer: >60 mL/min (ref >60)
Glucose, Bld: 83 mg/dL (ref 70–99)
Potassium: 3.4 mmol/L — ABNORMAL LOW (ref 3.5–5.1)
Sodium: 135 mmol/L (ref 135–145)
Total Bilirubin: 0.4 mg/dL (ref 0.3–1.2)
Total Protein: 6.8 g/dL (ref 6.5–8.1)

## 2019-04-13 LAB — TYPE AND SCREEN
ABO/RH(D): B POS
Antibody Screen: NEGATIVE

## 2019-04-13 LAB — ABO/RH: ABO/RH(D): B POS

## 2019-04-13 LAB — PROTEIN / CREATININE RATIO, URINE
Creatinine, Urine: 121.63 mg/dL
Protein Creatinine Ratio: 0.35 mg/mg{Cre} — ABNORMAL HIGH (ref 0.00–0.15)
Total Protein, Urine: 42 mg/dL

## 2019-04-13 LAB — SARS CORONAVIRUS 2 (TAT 6-24 HRS): SARS Coronavirus 2: NEGATIVE

## 2019-04-13 LAB — RPR: RPR Ser Ql: NONREACTIVE

## 2019-04-13 MED ORDER — PENICILLIN G 3 MILLION UNITS IVPB - SIMPLE MED: 3.0000 10*6.[IU] | INTRAVENOUS | Status: DC

## 2019-04-13 MED ORDER — LABETALOL HCL 5 MG/ML IV SOLN: 20.0000 mg | INTRAVENOUS | Status: DC | PRN

## 2019-04-13 MED ORDER — DIPHENHYDRAMINE HCL 50 MG/ML IJ SOLN: 12.5000 mg | INTRAMUSCULAR | Status: DC | PRN

## 2019-04-13 MED ORDER — PRENATAL MULTIVITAMIN CH
1.0000 | ORAL_TABLET | Freq: Every day | ORAL | Status: DC
  Administered 2019-04-14: 1 via ORAL
  Filled 2019-04-13: qty 1

## 2019-04-13 MED ORDER — DIPHENHYDRAMINE HCL 25 MG PO CAPS: 25.0000 mg | ORAL_CAPSULE | Freq: Four times a day (QID) | ORAL | Status: DC | PRN

## 2019-04-13 MED ORDER — BENZOCAINE-MENTHOL 20-0.5 % EX AERO
1.0000 "application " | INHALATION_SPRAY | CUTANEOUS | Status: DC | PRN
  Administered 2019-04-13: 1 via TOPICAL
  Filled 2019-04-13: qty 56

## 2019-04-13 MED ORDER — ONDANSETRON HCL 4 MG PO TABS: 4.0000 mg | ORAL_TABLET | ORAL | Status: DC | PRN

## 2019-04-13 MED ORDER — FLEET ENEMA 7-19 GM/118ML RE ENEM: 1.0000 | ENEMA | RECTAL | Status: DC | PRN

## 2019-04-13 MED ORDER — PHENYLEPHRINE 40 MCG/ML (10ML) SYRINGE FOR IV PUSH (FOR BLOOD PRESSURE SUPPORT): 80.0000 ug | PREFILLED_SYRINGE | INTRAVENOUS | Status: DC | PRN

## 2019-04-13 MED ORDER — COCONUT OIL OIL: 1.0000 "application " | TOPICAL_OIL | Status: DC | PRN

## 2019-04-13 MED ORDER — LABETALOL HCL 5 MG/ML IV SOLN: 80.0000 mg | INTRAVENOUS | Status: DC | PRN

## 2019-04-13 MED ORDER — EPHEDRINE 5 MG/ML INJ: 10.0000 mg | INTRAVENOUS | Status: DC | PRN

## 2019-04-13 MED ORDER — WITCH HAZEL-GLYCERIN EX PADS: 1.0000 "application " | MEDICATED_PAD | CUTANEOUS | Status: DC | PRN

## 2019-04-13 MED ORDER — ONDANSETRON HCL 4 MG/2ML IJ SOLN: 4.0000 mg | INTRAMUSCULAR | Status: DC | PRN

## 2019-04-13 MED ORDER — OXYCODONE-ACETAMINOPHEN 5-325 MG PO TABS: 1.0000 | ORAL_TABLET | ORAL | Status: DC | PRN

## 2019-04-13 MED ORDER — SOD CITRATE-CITRIC ACID 500-334 MG/5ML PO SOLN: 30.0000 mL | ORAL | Status: DC | PRN

## 2019-04-13 MED ORDER — OXYTOCIN BOLUS FROM INFUSION
500.0000 mL | Freq: Once | INTRAVENOUS | Status: AC
  Administered 2019-04-13: 10:00:00 500 mL via INTRAVENOUS

## 2019-04-13 MED ORDER — ZOLPIDEM TARTRATE 5 MG PO TABS: 5.0000 mg | ORAL_TABLET | Freq: Every evening | ORAL | Status: DC | PRN

## 2019-04-13 MED ORDER — ONDANSETRON HCL 4 MG/2ML IJ SOLN: 4.0000 mg | Freq: Four times a day (QID) | INTRAMUSCULAR | Status: DC | PRN

## 2019-04-13 MED ORDER — SODIUM CHLORIDE 0.9 % IV SOLN
2.0000 g | Freq: Once | INTRAVENOUS | Status: AC
  Administered 2019-04-13: 08:00:00 2 g via INTRAVENOUS
  Filled 2019-04-13: qty 2000

## 2019-04-13 MED ORDER — LACTATED RINGERS IV SOLN: 500.0000 mL | Freq: Once | INTRAVENOUS | Status: DC

## 2019-04-13 MED ORDER — PHENYLEPHRINE 40 MCG/ML (10ML) SYRINGE FOR IV PUSH (FOR BLOOD PRESSURE SUPPORT)
PREFILLED_SYRINGE | INTRAVENOUS | Status: AC
  Filled 2019-04-13: qty 10

## 2019-04-13 MED ORDER — LIDOCAINE HCL (PF) 1 % IJ SOLN: 30.0000 mL | INTRAMUSCULAR | Status: DC | PRN

## 2019-04-13 MED ORDER — SENNOSIDES-DOCUSATE SODIUM 8.6-50 MG PO TABS
2.0000 | ORAL_TABLET | ORAL | Status: DC
  Administered 2019-04-13 – 2019-04-14 (×2): 2 via ORAL
  Filled 2019-04-13 (×2): qty 2

## 2019-04-13 MED ORDER — TRANEXAMIC ACID-NACL 1000-0.7 MG/100ML-% IV SOLN
1000.0000 mg | INTRAVENOUS | Status: AC
  Administered 2019-04-13: 10:00:00 1000 mg via INTRAVENOUS
  Filled 2019-04-13: qty 100

## 2019-04-13 MED ORDER — LIDOCAINE HCL (PF) 1 % IJ SOLN
INTRAMUSCULAR | Status: DC | PRN
  Administered 2019-04-13: 11 mL via EPIDURAL

## 2019-04-13 MED ORDER — ACETAMINOPHEN 325 MG PO TABS: 650.0000 mg | ORAL_TABLET | ORAL | Status: DC | PRN

## 2019-04-13 MED ORDER — OXYCODONE-ACETAMINOPHEN 5-325 MG PO TABS: 2.0000 | ORAL_TABLET | ORAL | Status: DC | PRN

## 2019-04-13 MED ORDER — LACTATED RINGERS IV SOLN: 500.0000 mL | INTRAVENOUS | Status: DC | PRN

## 2019-04-13 MED ORDER — TETANUS-DIPHTH-ACELL PERTUSSIS 5-2.5-18.5 LF-MCG/0.5 IM SUSP: 0.5000 mL | Freq: Once | INTRAMUSCULAR | Status: DC

## 2019-04-13 MED ORDER — LACTATED RINGERS IV SOLN
INTRAVENOUS | Status: DC
  Administered 2019-04-13: 07:00:00 via INTRAVENOUS

## 2019-04-13 MED ORDER — FENTANYL-BUPIVACAINE-NACL 0.5-0.125-0.9 MG/250ML-% EP SOLN
EPIDURAL | Status: AC
  Filled 2019-04-13: qty 250

## 2019-04-13 MED ORDER — OXYTOCIN 40 UNITS IN NORMAL SALINE INFUSION - SIMPLE MED
2.5000 [IU]/h | INTRAVENOUS | Status: DC
  Filled 2019-04-13: qty 1000

## 2019-04-13 MED ORDER — SIMETHICONE 80 MG PO CHEW: 80.0000 mg | CHEWABLE_TABLET | ORAL | Status: DC | PRN

## 2019-04-13 MED ORDER — LABETALOL HCL 5 MG/ML IV SOLN: 40.0000 mg | INTRAVENOUS | Status: DC | PRN

## 2019-04-13 MED ORDER — HYDRALAZINE HCL 20 MG/ML IJ SOLN
10.0000 mg | INTRAMUSCULAR | Status: DC | PRN
  Filled 2019-04-13: qty 0.5

## 2019-04-13 MED ORDER — FENTANYL-BUPIVACAINE-NACL 0.5-0.125-0.9 MG/250ML-% EP SOLN: 12.0000 mL/h | EPIDURAL | Status: DC | PRN

## 2019-04-13 MED ORDER — DIBUCAINE (PERIANAL) 1 % EX OINT: 1.0000 "application " | TOPICAL_OINTMENT | CUTANEOUS | Status: DC | PRN

## 2019-04-13 MED ORDER — SODIUM CHLORIDE 0.9 % IV SOLN: 5.0000 10*6.[IU] | Freq: Once | INTRAVENOUS | Status: DC

## 2019-04-13 MED ORDER — IBUPROFEN 600 MG PO TABS
600.0000 mg | ORAL_TABLET | Freq: Four times a day (QID) | ORAL | Status: DC
  Administered 2019-04-13 – 2019-04-15 (×6): 600 mg via ORAL
  Filled 2019-04-13 (×7): qty 1

## 2019-04-13 MED ORDER — SODIUM CHLORIDE (PF) 0.9 % IJ SOLN
INTRAMUSCULAR | Status: DC | PRN
  Administered 2019-04-13: 12 mL/h via EPIDURAL

## 2019-04-13 NOTE — H&P (Addendum)
jaOBSTETRIC ADMISSION HISTORY AND PHYSICAL  Sue Graves is a 23 y.o. female [phone] with IUP at 66w5dby 18wk U/S presenting for SROM with meconium at 6 am. She reports +FMs, no VB, no blurry vision, headaches or peripheral edema, and RUQ pain.  She plans on bottle feeding. She request Depo for birth control. 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 Asthma    as a child , no inhaler  . Depression    history - no meds  . Missed abortion    no surgery required  . Pregnancy induced hypertension     Past Surgical History: Past Surgical History:  Procedure Laterality Date  . DILATION AND EVACUATION N/A 05/01/2016   Procedure: DILATATION AND EVACUATION for Retained Product;  Surgeon: UOsborne Oman MD;  [facility];  Service: Gynecology;  Laterality: N/A;  . WISDOM TOOTH EXTRACTION      Obstetrical History: OB History    Gravida  4   Para  1   Term  1   Preterm      AB  2   Living  1     SAB  2   TAB      Ectopic      Multiple  0   Live Births  1           Social History: Social History   Socioeconomic History  . Marital status: Single    Spouse name: Not on file  . Number of children: Not on file  . Years of education: Not on file  . Highest education level: Not on file  Occupational History  . Not on file  Social Needs  . Financial resource strain: Not on file  . Food insecurity    Worry: Not on file    Inability: Not on file  . Transportation needs    Medical: Not on file    Non-medical: Not on file  Tobacco Use  . Smoking status: Former Smoker    Packs/day: 0.25    Years:  3.00    Pack years: 0.75    Types: Cigarettes    Quit date: 12/17/2016    Years since quitting: 2.3  . Smokeless tobacco: Never Used  Substance and Sexual Activity  . Alcohol use: Not Currently    Comment: occasionally   . Drug use: Not Currently    Types: Marijuana    Comment: last used 01/27/2019  . Sexual activity: Yes    Birth control/protection: None  Lifestyle  . Physical activity    Days per week: Not on file    Minutes per session: Not on file  . Stress: Not on file  Relationships  . Social [place] phone: Not on file    Gets together: Not on file    Attends religious service: Not on file    Active member of club or organization: Not on file    Attends meetings of clubs or organizations: Not on file    Relationship status: Not on file  Other Topics Concern  . Not on file  Social History Narrative  . Not on file  Family History: Family History  Problem Relation Age of Onset  . Diabetes Maternal Aunt   . Healthy Mother   . Healthy Father     Allergies: Allergies  Allergen Reactions  . Flagyl [Metronidazole] Rash  . Other Itching    Scented soaps/body wash  . Tomato Hives and Swelling    Medications Prior to Admission  Medication Sig Dispense Refill Last Dose  . aspirin EC 81 MG tablet Take 1 tablet (81 mg total) by mouth daily. Take after 12 weeks for prevention of preeclampsia later in pregnancy 300 tablet 2 Past Week at Unknown time  . Blood Pressure Monitor KIT 1 each by Does not apply route daily. (Patient not taking: Reported on 04/01/2019) 1 each 0      Review of Systems   All systems reviewed and negative except as stated in HPI  Last menstrual period 06/29/2018. General appearance: alert, cooperative and appears stated age Lungs: clear to auscultation bilaterally Heart: regular rate and rhythm Abdomen: soft, non-tender; bowel sounds normal Presentation: cephalic Fetal monitoringBaseline: 125 bpm, Variability: Good {> 6 bpm)  and Accelerations: Reactive Uterine activityFrequency: Every 5 minutes Dilation: 5 Effacement (%): 50 Station: -2 Exam by:: Denyse Dago, RN   Prenatal labs: ABO, Rh: B/Positive/-- (03/19 1040) Antibody: Negative (03/19 1040) Rubella: 8.09 (03/19 1040) RPR: Non Reactive (05/28 1029)  HBsAg: Negative (03/19 1040)  HIV: Non Reactive (05/28 1029)  GBS:    3rd trimester GTT - normal Genetic screening - increased risk of SMA Anatomy US incomplete cardiac and nuchal fold assessment  Prenatal Transfer Tool  Maternal Diabetes: No Genetic Screening: Abnormal:  Results: Other: SMA Maternal Ultrasounds/Referrals: Other: Fetal Ultrasounds or other Referrals:  None Maternal Substance Abuse:  Yes:  Type: Marijuana Significant Maternal Medications:  None Significant Maternal Lab Results: Group B Strep positive  No results found for this or any previous visit (from the past 24 hour(s)).  Patient Active Problem List   Diagnosis Date Noted  . 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 GBS bacteriuria 11/09/2018  . History of gestational hypertension 11/05/2018  . Insufficient prenatal care 11/05/2018  . Supervision of other normal pregnancy, antepartum 11/04/2018  . Morning sickness 08/25/2018  . Trichimoniasis 08/25/2018  . Tobacco abuse 09/18/2016    Assessment/Plan:  Sue Graves is a 23 y.o. [id number] at 34w5dhere for SROM  #Labor: Progressing without augmentation. Continue to manage expectantly #Pain: IV pain meds for now. Epidural upon request. #FWB: Category I #ID:  GBS positive, penicillin #MOF: Bottle #MOC: Depo #Circ:  no #gHTN: BP elevated on admission.  Orders in for labetalol PRN for elevated BP.  F/U PIH labs.  #History of PPH: TXA available at delivery. History of asthma and HTN.  PMatilde Haymaker MD  04/13/2019, 7:06 AM  I personally saw and evaluated the patient, performing the key elements of the service. I developed and verified the management plan that is  described in the resident's/student's note, and I agree with the content with my edits above. VSS, HRR&R, Resp unlabored, Legs neg.  FNigel Berthold CNM 04/13/2019 9:16 AM

## 2019-04-13 NOTE — Anesthesia Postprocedure Evaluation (Signed)
Anesthesia Post Note  Patient: Sue Graves  Procedure(s) Performed: AN AD HOC LABOR EPIDURAL     Patient location during evaluation: Mother Baby Anesthesia Type: Epidural Level of consciousness: awake and alert Pain management: pain level controlled Vital Signs Assessment: post-procedure vital signs reviewed and stable Respiratory status: spontaneous breathing, nonlabored ventilation and respiratory function stable Cardiovascular status: stable Postop Assessment: no headache, no backache, epidural receding, adequate PO intake and patient able to bend at knees Anesthetic complications: no    Last Vitals:  Vitals:   04/13/19 1301 04/13/19 1358  BP: (!) 130/94 (!) 134/92  Pulse: 78 77  Resp: 20 20  Temp:  37.1 C  SpO2:  100%    Last Pain:  Vitals:   04/13/19 1358  TempSrc: Oral  PainSc:    Pain Goal:                   [name]

## 2019-04-13 NOTE — Anesthesia Preprocedure Evaluation (Signed)
Anesthesia Evaluation  Patient identified by MRN, date of birth, ID band Patient awake    Reviewed: Allergy & Precautions, H&P , NPO status , Patient's Chart, lab work & pertinent test results, reviewed documented beta blocker date and time   Airway Mallampati: II  TM Distance: >3 FB Neck ROM: full    Dental no notable dental hx.    Pulmonary neg pulmonary ROS, Patient abstained from smoking., former smoker,    Pulmonary exam normal breath sounds clear to auscultation       Cardiovascular hypertension, negative cardio ROS Normal cardiovascular exam Rhythm:regular Rate:Normal     Neuro/Psych negative neurological ROS  negative psych ROS   GI/Hepatic negative GI ROS, Neg liver ROS,   Endo/Other  negative endocrine ROS  Renal/GU negative Renal ROS  negative genitourinary   Musculoskeletal   Abdominal   Peds  Hematology negative hematology ROS (+)   Anesthesia Other Findings   Reproductive/Obstetrics (+) Pregnancy                             Anesthesia Physical  Anesthesia Plan  ASA: II  Anesthesia Plan: Epidural   Post-op Pain Management:    Induction:   PONV Risk Score and Plan:   Airway Management Planned:   Additional Equipment:   Intra-op Plan:   Post-operative Plan:   Informed Consent: I have reviewed the patients History and Physical, chart, labs and discussed the procedure including the risks, benefits and alternatives for the proposed anesthesia with the patient or authorized representative who has indicated his/her understanding and acceptance.       Plan Discussed with:   Anesthesia Plan Comments:         Anesthesia Quick Evaluation

## 2019-04-13 NOTE — MAU Note (Signed)
Pt reports to MAU c/o ctx every few min. Pt reports SROM @ 0600 greenish fluid. Pt reports +FM.

## 2019-04-13 NOTE — Discharge Summary (Addendum)
OB Discharge Summary     Patient Name: Sue Graves DOB: 08-22-1995 MRN: [id number]  Date of admission: 04/13/2019 Delivering MD: Merilyn Baba   Date of discharge: 04/15/2019  Admitting diagnosis: 39.4wks ctx Intrauterine pregnancy: [redacted]w[redacted]d    Secondary diagnosis:  Active Problems:   Amniotic fluid leaking   Gestational hypertension   History of postpartum hemorrhage   History of asthma  Additional problems: none     Discharge diagnosis: Term Pregnancy Delivered                                                                                                Post partum procedures:none  Augmentation: none  Complications: None  Hospital course:  Onset of Labor With Vaginal Delivery     23y.o. yo [id number] 335w5das admitted in Active Labor on 04/13/2019. Patient had an uncomplicated labor course as follows:  Membrane Rupture Time/Date: 6:00 AM ,04/13/2019   Intrapartum Procedures: Episiotomy: None [1]                                         Lacerations:  None [1]  Patient had a delivery of a Viable infant. 04/13/2019  Information for the patient's newborn:  MaTonantzin, Mimnaugh0[[id number]]Delivery Method: Vaginal, Spontaneous(Filed from Delivery Summary)  TXA was given immediately postpartum for history of postpartum hemorrhage.   Patient had an uncomplicated postpartum course.  She is ambulating, tolerating a regular diet, passing flatus, and urinating well. Patient is discharged home in stable condition on 04/15/19.   Physical exam  Vitals:   04/14/19 0931 04/14/19 1418 04/14/19 2100 04/15/19 0547  BP: 118/79 (!) 132/92 (!) 132/95 124/80  Pulse: 70 65 73 69  Resp:  18 17   Temp: 98.3 F (36.8 C) 98.4 F (36.9 C) 98.2 F (36.8 C) 98.3 F (36.8 C)  TempSrc: Oral Oral Oral Oral  SpO2:  100% 100% 100%  Weight:      Height:       General: alert, cooperative and no distress Lochia: appropriate Uterine Fundus: firm Incision: N/A DVT Evaluation: No evidence of  DVT seen on physical exam. Labs: Lab Results  Component Value Date   WBC 8.2 04/14/2019   HGB 12.9 04/14/2019   HCT 38.9 04/14/2019   MCV 94.9 04/14/2019   PLT 220 04/14/2019   CMP Latest Ref Rng & Units 04/13/2019  Glucose 70 - 99 mg/dL 83  BUN 6 - 20 mg/dL 7  Creatinine 0.44 - 1.00 mg/dL 0.87  Sodium 135 - 145 mmol/L 135  Potassium 3.5 - 5.1 mmol/L 3.4(L)  Chloride 98 - 111 mmol/L 106  CO2 22 - 32 mmol/L 17(L)  Calcium 8.9 - 10.3 mg/dL 9.4  Total Protein 6.5 - 8.1 g/dL 6.8  Total Bilirubin 0.3 - 1.2 mg/dL 0.4  Alkaline Phos 38 - 126 U/L 147(H)  AST 15 - 41 U/L 19  ALT 0 - 44 U/L 13    Discharge instruction: per After Visit Summary and "Baby  and Me Booklet".  After visit meds:  Allergies as of 04/15/2019      Reactions   Flagyl [metronidazole] Rash   Other Itching   Scented soaps/body wash   Tomato Hives, Swelling   Latex Swelling   In vagina; able to wear latex gloves      Medication List    STOP taking these medications   aspirin EC 81 MG tablet   Blood Pressure Monitor Kit     TAKE these medications   acetaminophen 325 MG tablet Commonly known as: Tylenol Take 2 tablets (650 mg total) by mouth every 4 (four) hours as needed (for pain scale < 4).      Diet: routine diet  Activity: Advance as tolerated. Pelvic rest for 6 weeks.   Outpatient follow up:4 weeks and one week f/u for BP check Follow up Appt: Future Appointments  Date Time Provider [facility]  04/22/2019 10:30 AM [place] None  05/12/2019 10:15 AM [place], Kathie Dike, CNM [facility] None   Follow up Visit:No follow-ups on file.  Postpartum contraception: Depo Provera IP  Newborn Data: Live born female  Birth Weight:   APGAR: 52, 9  Newborn Delivery   Birth date/time: 04/13/2019 10:04:00 Delivery type: Vaginal, Spontaneous      Baby Feeding: Bottle Disposition:NICU   04/15/2019 Matilde Haymaker, MD  I was present for the exam and agree with above. HCTZ 25 mg QD x  7 days. Pre-E precautions.   Tamala Julian, [place], [place] 04/15/2019 6:31 PM

## 2019-04-13 NOTE — Anesthesia Procedure Notes (Signed)
Epidural Patient location during procedure: OB Start time: 04/13/2019 8:44 AM End time: 04/13/2019 8:59 AM  Staffing Anesthesiologist: Lynda Rainwater, MD Performed: anesthesiologist   Preanesthetic Checklist Completed: patient identified, site marked, surgical consent, pre-op evaluation, timeout performed, IV checked, risks and benefits discussed and monitors and equipment checked  Epidural Patient position: sitting Prep: ChloraPrep Patient monitoring: heart rate, cardiac monitor, continuous pulse ox and blood pressure Approach: midline Location: L2-L3 Injection technique: LOR saline  Needle:  Needle type: Tuohy  Needle gauge: 17 G Needle length: 9 cm Needle insertion depth: 5 cm Catheter type: closed end flexible Catheter size: 20 Guage Catheter at skin depth: 9 cm Test dose: negative  Assessment Events: blood not aspirated, injection not painful, no injection resistance, negative IV test and no paresthesia  Additional Notes Reason for block:procedure for pain

## 2019-04-14 LAB — CBC
HCT: 38.9 % (ref 36.0–46.0)
Hemoglobin: 12.9 g/dL (ref 12.0–15.0)
MCH: 31.5 pg (ref 26.0–34.0)
MCHC: 33.2 g/dL (ref 30.0–36.0)
MCV: 94.9 fL (ref 80.0–100.0)
Platelets: 220 10*3/uL (ref 150–400)
RBC: 4.1 MIL/uL (ref 3.87–5.11)
RDW: 13.3 % (ref 11.5–15.5)
WBC: 8.2 10*3/uL (ref 4.0–10.5)
nRBC: 0 % (ref 0.0–0.2)

## 2019-04-14 NOTE — Progress Notes (Signed)
POSTPARTUM PROGRESS NOTE  Post Partum Day 1  Subjective:  Sue Graves is a 23 y.o. [id number] s/p SVD at [redacted]w[redacted]d.  She reports she is doing well. No acute events overnight. She denies any problems with ambulating, voiding or po intake. Denies nausea or vomiting.  Pain is moderately controlled, still having intermittent pelvic cramping.  Lochia is appropriate (pink).  Objective: Blood pressure 123/86, pulse 66, temperature 98.2 F (36.8 C), temperature source Oral, resp. rate 18, height 5' (1.524 m), weight 56.7 kg, last menstrual period 06/29/2018, SpO2 100 %, unknown if currently breastfeeding.  Physical Exam:  General: alert, cooperative and no distress Chest: no respiratory distress. CTAB Heart:regular rate, no murmurs, distal pulses intact Abdomen: soft, nontender, normoactive bowel sounds Uterine Fundus: firm, appropriately tender DVT Evaluation: No calf swelling or tenderness Extremities: No edema Skin: warm, dry  Recent Labs    04/13/19 0732  HGB 14.7  HCT 43.2    Assessment/Plan: Sue Graves is a 23 y.o. M4W8032 s/p SVD at [redacted]w[redacted]d   PPD#1 - Doing well  Routine postpartum care Contraception: Depo  Feeding: Bottle Dispo: Plan for discharge 8/27.   LOS: 1 day   [place] Student 04/14/2019, 7:10 AM

## 2019-04-14 NOTE — Progress Notes (Signed)
Pt. Transferred at [facility] via w/c

## 2019-04-14 NOTE — Clinical Social Work Maternal (Addendum)
CLINICAL SOCIAL WORK MATERNAL/CHILD NOTE  Patient Details  Name: Sue Graves MRN: [id number] Date of Birth: August 29, 1995  Date:  04/14/2019  Clinical Social Worker Initiating Note:  Laurey Arrow Date/Time: Initiated:  04/14/19/1117     Child's Name:  Sue Graves   Biological Parents:  Mother(Per MOB FOB is Rory Percy (age 25).  MOB shared that FOB will not be involved.)   Need for Interpreter:  None   Reason for Referral:  Current Substance Use/Substance Use During Pregnancy    Address:  [address] [place] [address]    Phone number:  [phone] (home)     Additional phone number:   Household Members/Support Graves (HM/SP):   Household Member/Support Person 1   HM/SP Name Relationship DOB or Age  HM/SP -1 Lessie Dings Dolinski son 03/19/2017  HM/SP -2        HM/SP -3        HM/SP -4        HM/SP -5        HM/SP -6        HM/SP -7        HM/SP -8          Natural Supports (not living in the home):  Extended Family, Immediate Family, Parent   Professional Supports: None   Employment: Unemployed   Type of Work:     Education:  9 to 11 years(MOB shared that MOB is attending a Automotive engineer to obtain her GED.)   Homebound arranged: No  Financial Resources:  [employer]   Other Resources:  Physicist, medical , [place] Considerations Which May Impact Care:  None reported  Strengths:  Ability to meet basic needs , Engineer, materials, Home prepared for child    Psychotropic Medications:         Pediatrician:    [place]olic[place] area  Pediatrician List:   Government social research officer)  [place]      Pediatrician Fax Number:    Risk Factors/Current Problems:  Substance Use    Cognitive State:  Alert , Able to Concentrate , Linear Thinking , Insightful    Mood/Affect:  Happy , Calm , Interested , Relaxed    CSW Assessment: CSW met with MOB in room 327  to complete an assessment for hx of substance use. When CSW arrived MOB was resting in bed an infant was asleep in isolette.  CSW explained CSW's role and MOB was inviting, easy to engage, and forthcoming.   CSW asked about MOB's thoughts and feeling regarding infant's NICU admission.  MOB shared feelings of being worried and frustrated. Per MOB, MOB has " A better understanding"  of infant's health after speaking with the NP.  MOB shared "I am not worried as much I as was." However, MOB communicated, "I still frustrated about this whole visitation thing." MOB communicated her story regarding her support person not being able to visit with MOB and infant on the NICU floor."  CSW validated MOB's feelings and agreed to speak with nursing director regarding MOB's concerns; MOB was appreciative.  CSW provided education regarding the baby blues period vs. perinatal mood disorders, discussed treatment and gave resources for mental health follow up if concerns arise.  CSW recommends self-evaluation during the postpartum time period and encouraged MOB to contact a medical professional if symptoms are noted at any time.  MOB denied MH hx and  symptoms during her pregnancy.  CSW assessed for safety and MOB denied SI, HI, and DV. MOB did not present with any acute MH symptoms and reported having a good support team that consists of her immediate and extended family members.   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  Per MOB, MOB's last use was 1 week ago.  MOB communicated "I smoked marijuana to increase my appetite, decrease my nausea, and to decrease my heartburn. That's why my baby is so small because I couldn't eat."  CSW explained hospital's substance exposure policy and MOB was understanding.  MOB shared "I'm familiar because when I had my last baby the same thing happened." CSW informed CSW that CSW will continue to monitor infant's UDS and CDS and will make a  report to [place] [facility] if warranted; MOB was understanding and denied the use of all other substances.  CSW provided review of Sudden Infant Death Syndrome (SIDS) precautions.   CSW will continue to offer resources and supports to family while infant remains in NICU.   CSW Plan/Description:  Sudden Infant Death Syndrome (SIDS) Education, Psychosocial Support and Ongoing Assessment of Needs, Perinatal Mood and Anxiety Disorder (PMADs) Education, Other Patient/Family Education, [place], Other Information/Referral to [employer], CSW Will Continue to Monitor Umbilical Cord Tissue Drug Screen Results and Make Report if Warranted   Laurey Arrow, MSW, LCSW Clinical Social Work [phone]   Dimple Nanas, [place] 04/14/2019, 11:34 AM

## 2019-04-14 NOTE — Progress Notes (Signed)
Pt. D/c to couplet care with infant pt. Alert ox3

## 2019-04-14 NOTE — Lactation Note (Signed)
This note was copied from a baby's chart. Lactation Consultation Note  Patient Name: [name] [id number] Date: 04/14/2019  P2, 42 hour female infant. Per mom, she decided not to breastfeed she feels it will be to painful and hurt according to her friends. LC explained if infant is latching well breastfeed doesn't have to be painful and discussed breastfeeding benefits to mom. Mom will ask for [facility] services if she changes her mind and decides to latch infant to breast.    Maternal Data    Feeding Feeding Type: Formula Nipple Type: Slow - flow  LATCH Score                   Interventions    Lactation Tools Discussed/Used     Consult Status      Vicente Serene 04/14/2019, 12:08 AM

## 2019-04-15 ENCOUNTER — Other Ambulatory Visit: Payer: Self-pay | Admitting: Advanced Practice Midwife

## 2019-04-15 DIAGNOSIS — O1493 Unspecified pre-eclampsia, third trimester: Secondary | ICD-10-CM

## 2019-04-15 MED ORDER — ACETAMINOPHEN 325 MG PO TABS: 650.0000 mg | ORAL_TABLET | ORAL | Status: DC | PRN

## 2019-04-15 MED ORDER — HYDROCHLOROTHIAZIDE 25 MG PO TABS: 25.0000 mg | ORAL_TABLET | Freq: Every day | ORAL | 0 refills | Status: DC

## 2019-04-15 MED ORDER — MEDROXYPROGESTERONE ACETATE 150 MG/ML IM SUSP
150.0000 mg | Freq: Once | INTRAMUSCULAR | Status: AC
  Administered 2019-04-15: 150 mg via INTRAMUSCULAR
  Filled 2019-04-15: qty 1

## 2019-04-15 NOTE — Progress Notes (Unsigned)
Borderline BPs at D/C. Will add HCTZ 25 mf PO QD x 7 days.   Tamala Julian, [place], [place] 04/15/2019 6:31 PM

## 2019-04-15 NOTE — Discharge Instructions (Signed)

## 2019-04-20 ENCOUNTER — Encounter: Payer: Medicaid Other | Admitting: Obstetrics & Gynecology

## 2019-04-30 ENCOUNTER — Telehealth: Payer: Self-pay | Admitting: Obstetrics

## 2019-05-12 ENCOUNTER — Ambulatory Visit: Payer: Medicaid Other | Admitting: Advanced Practice Midwife

## 2019-05-25 ENCOUNTER — Telehealth: Payer: Self-pay | Admitting: Advanced Practice Midwife

## 2019-07-05 ENCOUNTER — Other Ambulatory Visit: Payer: Self-pay | Admitting: Obstetrics

## 2019-07-05 MED ORDER — MEDROXYPROGESTERONE ACETATE 150 MG/ML IM SUSP: 150.0000 mg | INTRAMUSCULAR | 0 refills | Status: DC

## 2019-07-14 ENCOUNTER — Ambulatory Visit: Payer: Medicaid Other

## 2019-07-14 ENCOUNTER — Telehealth: Payer: Self-pay | Admitting: *Deleted

## 2019-07-14 NOTE — Telephone Encounter (Signed)
Pt called to office stating she couldn't get depo Rx at pharmacy and she doesn't know why.    Placed call to pharmacy.  They state that the pt has a $30 copay that she must pay in order to get Rx.  They do not have MCD ins on file for her.   Attempt to contact pt.  Left detailed msg on VM making her aware that she may get Rx but she has a copay.  Advised if she has active MCD to make pharmacy aware, otherwise she will have to pay her copay. Advised to call Monday morning to set up appt.

## 2019-08-17 ENCOUNTER — Ambulatory Visit (HOSPITAL_COMMUNITY)
Admission: EM | Admit: 2019-08-17 | Discharge: 2019-08-17 | Disposition: A | Discharge status: Home / Self Care | Payer: Medicaid Other | Attending: Family Medicine | Admitting: Family Medicine

## 2019-08-17 ENCOUNTER — Other Ambulatory Visit: Payer: Self-pay

## 2019-08-17 ENCOUNTER — Encounter (HOSPITAL_COMMUNITY): Payer: Self-pay

## 2019-08-17 DIAGNOSIS — Z3202 Encounter for pregnancy test, result negative: Secondary | ICD-10-CM | POA: Diagnosis not present

## 2019-08-17 DIAGNOSIS — N764 Abscess of vulva: Secondary | ICD-10-CM

## 2019-08-17 DIAGNOSIS — L0291 Cutaneous abscess, unspecified: Secondary | ICD-10-CM

## 2019-08-17 LAB — POC URINE PREG, ED
Preg Test, Ur: NEGATIVE
Preg Test, Ur: NEGATIVE

## 2019-08-17 LAB — POCT PREGNANCY, URINE: Preg Test, Ur: NEGATIVE

## 2019-08-17 MED ORDER — DOXYCYCLINE HYCLATE 100 MG PO CAPS: 100.0000 mg | ORAL_CAPSULE | Freq: Two times a day (BID) | ORAL | 0 refills | Status: DC

## 2019-08-17 NOTE — ED Provider Notes (Signed)
[facility]    CSN: [id number] Arrival date & time: 08/17/19  1144      History   Chief Complaint Chief Complaint  Patient presents with  . Abscess    HPI Sue Graves is a 23 y.o. female.   Patient is a 23 year old female that presents today with abscess to left genital area near her left labia.  This is been present and worsening the past 3 days.  The area is swollen and redness extending into the left upper thigh area.  Denies any drainage from the area but she has been doing warm compresses.  She has been taking ibuprofen with some relief.  She has had mild fever.  Pain with walking.  She does shave the vaginal area.  Denies any history of similar.  Denies any history of MRSA  ROS per HPI'   Abscess   Past Medical History:  Diagnosis Date  . Asthma    as a child , no inhaler  . Depression    history - no meds  . Missed abortion    no surgery required  . Pregnancy induced hypertension     Patient Active Problem List   Diagnosis Date Noted  . Amniotic fluid leaking 04/13/2019  . Gestational hypertension 04/13/2019  . History of postpartum hemorrhage 04/13/2019  . History of asthma 04/13/2019  . 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 GBS bacteriuria 11/09/2018  . History of gestational hypertension 11/05/2018  . Insufficient prenatal care 11/05/2018  . Supervision of other normal pregnancy, antepartum 11/04/2018  . Morning sickness 08/25/2018  . Trichimoniasis 08/25/2018  . Tobacco abuse 09/18/2016    Past Surgical History:  Procedure Laterality Date  . DILATION AND EVACUATION N/A 05/01/2016   Procedure: DILATATION AND EVACUATION for Retained Product;  Surgeon: Tereso NewcomerUgonna A Anyanwu, MD;  Location: [facility];  Service: Gynecology;  Laterality: N/A;  . WISDOM TOOTH EXTRACTION      OB History    Gravida  4   Para  2   Term  2   Preterm      AB  2   Living  2     SAB  2   TAB      Ectopic      Multiple  0   Live Births  2            Home Medications    Prior to Admission medications   Medication Sig Start Date End Date Taking? Authorizing Provider  acetaminophen (TYLENOL) 325 MG tablet Take 2 tablets (650 mg total) by mouth every 4 (four) hours as needed (for pain scale < 4). 04/15/19   Mirian MoFrank, Peter, MD  doxycycline (VIBRAMYCIN) 100 MG capsule Take 1 capsule (100 mg total) by mouth 2 (two) times daily. 08/17/19   Dahlia ByesBast, Michaiah Maiden A, NP  hydrochlorothiazide (HYDRODIURIL) 25 MG tablet Take 1 tablet (25 mg total) by mouth daily. 04/15/19   Katrinka BlazingSmith, [place], CNM  medroxyPROGESTERone (DEPO-PROVERA) 150 MG/ML injection Inject 1 mL (150 mg total) into the muscle every 3 (three) months. 07/05/19   Brock BadHarper, Charles A, MD    Family History Family History  Problem Relation Age of Onset  . Diabetes Maternal Aunt   . Healthy Mother   . Healthy Father     Social History Social History   Tobacco Use  . Smoking status: Former Smoker    Packs/day: 0.25    Years: 3.00    Pack years: 0.75    Types: Cigarettes    Quit  date: 12/17/2016    Years since quitting: 2.6  . Smokeless tobacco: Never Used  Substance Use Topics  . Alcohol use: Not Currently    Comment: occasionally   . Drug use: Not Currently    Types: Marijuana    Comment: last used 01/27/2019     Allergies   Flagyl [metronidazole], Other, Tomato, and Latex   Review of Systems Review of Systems   Physical Exam Triage Vital Signs ED Triage Vitals  Enc Vitals Group     BP 08/17/19 1220 (!) 122/93     Pulse Rate 08/17/19 1220 91     Resp 08/17/19 1220 18     Temp 08/17/19 1220 99.1 F (37.3 C)     Temp Source 08/17/19 1220 Oral     SpO2 08/17/19 1220 100 %     Weight 08/17/19 1225 119 lb (54 kg)     Height --      Head Circumference --      Peak Flow --      Pain Score 08/17/19 1223 10     Pain Loc --      Pain Edu? --      Excl. in GC? --    No data found.  Updated Vital Signs BP (!) 122/93 (BP Location: Right Arm)   Pulse 91   Temp 99.1  F (37.3 C) (Oral)   Resp 18   Wt 119 lb (54 kg)   SpO2 100%   BMI 23.24 kg/m   Visual Acuity Right Eye Distance:   Left Eye Distance:   Bilateral Distance:    Right Eye Near:   Left Eye Near:    Bilateral Near:     Physical Exam Vitals and nursing note reviewed.  Constitutional:      General: She is not in acute distress.    Appearance: Normal appearance. She is not ill-appearing, toxic-appearing or diaphoretic.  HENT:     Head: Normocephalic.     Nose: Nose normal.     Mouth/Throat:     Pharynx: Oropharynx is clear.  Eyes:     Conjunctiva/sclera: Conjunctivae normal.  Pulmonary:     Effort: Pulmonary effort is normal.  Abdominal:     Palpations: Abdomen is soft.     Tenderness: There is no abdominal tenderness.  Genitourinary:      Comments: Approximated 3 to 4 cm abscess to outer edge of left labia.  Tender to palpation with fluctuance. Mild erythema extending into right upper thigh with mild right upper thigh swelling Musculoskeletal:        General: Normal range of motion.     Cervical back: Normal range of motion.  Skin:    General: Skin is warm and dry.     Findings: No rash.  Neurological:     Mental Status: She is alert.  Psychiatric:        Mood and Affect: Mood normal.      UC Treatments / Results  Labs (all labs ordered are listed, but only abnormal results are displayed) Labs Reviewed  POC URINE PREG, ED  POCT PREGNANCY, URINE  POC URINE PREG, ED    EKG   Radiology No results found.  Procedures Incision and Drainage  Date/Time: 08/17/2019 2:34 PM Performed by: Janace Aris, NP Authorized by: Janace Aris, NP   Consent:    Consent obtained:  Verbal   Consent given by:  Patient   Risks discussed:  Bleeding, incomplete drainage, pain and damage to other organs  Alternatives discussed:  No treatment Universal protocol:    Patient identity confirmed:  Verbally with patient Location:    Type:  Abscess   Size:  5    Location:  Anogenital   Anogenital location:  Perineum Pre-procedure details:    Skin preparation:  Betadine Anesthesia (see MAR for exact dosages):    Anesthesia method:  Local infiltration   Local anesthetic:  Lidocaine 2% w/o epi Procedure type:    Complexity:  Simple Procedure details:    Needle aspiration: no     Incision types:  Single straight   Incision depth:  Subcutaneous   Scalpel blade:  11   Wound management:  Probed and deloculated   Drainage:  Purulent   Drainage amount:  Moderate   Wound treatment:  Wound left open   Packing materials:  None Post-procedure details:    Patient tolerance of procedure:  Tolerated well, no immediate complications   (including critical care time)  Medications Ordered in UC Medications - No data to display  Initial Impression / Assessment and Plan / UC Course  I have reviewed the triage vital signs and the nursing notes.  Pertinent labs & imaging results that were available during my care of the patient were reviewed by me and considered in my medical decision making (see chart for details).     Abscess-abscess I&D here in clinic with moderate amount of purulent discharge. Patient tolerated well. Left open and dressed area We will place her on doxycycline for antibiotic coverage We will have her continue warm compresses Follow up as needed for continued or worsening symptoms  Final Clinical Impressions(s) / UC Diagnoses   Final diagnoses:  Abscess     Discharge Instructions     We drained the abscess today here in clinic. Make sure you keep covered. Continue the warm compresses to promote more drainage. We are placing you on antibiotic.  Take this as prescribed. If your symptoms worsen or continue please follow-up Pregnancy test negative    ED Prescriptions    Medication Sig Dispense Auth. Provider   doxycycline (VIBRAMYCIN) 100 MG capsule Take 1 capsule (100 mg total) by mouth 2 (two) times daily. 20 capsule  Loura Halt A, NP     PDMP not reviewed this encounter.   Loura Halt A, NP 08/17/19 1441

## 2019-08-17 NOTE — Discharge Instructions (Addendum)
We drained the abscess today here in clinic. Make sure you keep covered. Continue the warm compresses to promote more drainage. We are placing you on antibiotic.  Take this as prescribed. If your symptoms worsen or continue please follow-up Pregnancy test negative

## 2019-08-17 NOTE — ED Triage Notes (Signed)
Pt states that has vaginal abscess x 3 days.

## 2021-05-21 ENCOUNTER — Other Ambulatory Visit: Payer: Self-pay

## 2021-05-21 ENCOUNTER — Encounter (HOSPITAL_COMMUNITY): Payer: Self-pay | Admitting: Emergency Medicine

## 2021-05-21 ENCOUNTER — Ambulatory Visit (HOSPITAL_COMMUNITY)
Admission: EM | Admit: 2021-05-21 | Discharge: 2021-05-21 | Disposition: A | Discharge status: Home / Self Care | Payer: Medicaid Other | Attending: Emergency Medicine | Admitting: Emergency Medicine

## 2021-05-21 DIAGNOSIS — N939 Abnormal uterine and vaginal bleeding, unspecified: Secondary | ICD-10-CM | POA: Insufficient documentation

## 2021-05-21 DIAGNOSIS — N3001 Acute cystitis with hematuria: Secondary | ICD-10-CM | POA: Insufficient documentation

## 2021-05-21 DIAGNOSIS — R1084 Generalized abdominal pain: Secondary | ICD-10-CM | POA: Diagnosis not present

## 2021-05-21 LAB — POCT URINALYSIS DIPSTICK, ED / UC
Glucose, UA: NEGATIVE mg/dL
Nitrite: NEGATIVE
Protein, ur: 30 mg/dL — AB
Specific Gravity, Urine: 1.02 (ref 1.005–1.030)
Urobilinogen, UA: 1 mg/dL (ref 0.0–1.0)
pH: 7 (ref 5.0–8.0)

## 2021-05-21 LAB — POC URINE PREG, ED: Preg Test, Ur: NEGATIVE

## 2021-05-21 MED ORDER — CEPHALEXIN 500 MG PO CAPS: 500.0000 mg | ORAL_CAPSULE | Freq: Four times a day (QID) | ORAL | 0 refills | Status: DC

## 2021-05-21 NOTE — ED Provider Notes (Signed)
[facility]    CSN: [id number] Arrival date & time: 05/21/21  6440      History   Chief Complaint Chief Complaint  Patient presents with   Menstrual Problem    HPI Sue Graves is a 25 y.o. female.   Her for irregular vaginal bleeding and abd pain. Had cycle on 9/1 then again on 9/22 with heavy cramping, with abd pain. Worse than her normal cycles. Has a 4 and 27 yr old but did have a miscarriage approx 5 yrs ago and had an DNC done. Is currently still having some bleeding. Denies any reason or thought of an STI. Pt does have use any form of birth control.    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   GBS bacteriuria 11/09/2018   History of gestational hypertension 11/05/2018   Insufficient prenatal care 11/05/2018   Supervision of other normal pregnancy, antepartum 11/04/2018   Morning sickness 08/25/2018   Trichimoniasis 08/25/2018   Tobacco abuse 09/18/2016    Past Surgical History:  Procedure Laterality Date   DILATION AND EVACUATION N/A 05/01/2016   Procedure: DILATATION AND EVACUATION for Retained Product;  Surgeon: Tereso Newcomer, MD;  Location: [facility];  Service: Gynecology;  Laterality: N/A;   WISDOM TOOTH EXTRACTION      OB History     Gravida  4   Para  2   Term  2   Preterm      AB  2   Living  2      SAB  2   IAB      Ectopic      Multiple  0   Live Births  2            Home Medications    Prior to Admission medications   Medication Sig Start Date End Date Taking? Authorizing Provider  cephALEXin (KEFLEX) 500 MG capsule  Take 1 capsule (500 mg total) by mouth 4 (four) times daily. 05/21/21  Yes Coralyn Mark, NP  acetaminophen (TYLENOL) 325 MG tablet Take 2 tablets (650 mg total) by mouth every 4 (four) hours as needed (for pain scale < 4). 04/15/19   Mirian Mo, MD  doxycycline (VIBRAMYCIN) 100 MG capsule Take 1 capsule (100 mg total) by mouth 2 (two) times daily. 08/17/19   Dahlia Byes A, NP  hydrochlorothiazide (HYDRODIURIL) 25 MG tablet Take 1 tablet (25 mg total) by mouth daily. 04/15/19   Katrinka Blazing, [place], CNM  medroxyPROGESTERone (DEPO-PROVERA) 150 MG/ML injection Inject 1 mL (150 mg total) into the muscle every 3 (three) months. 07/05/19   Brock Bad, MD    Family History Family History  Problem Relation Age of Onset   Diabetes Maternal Aunt    Healthy Mother    Healthy Father     Social History Social History   Tobacco Use   Smoking status: Former    Packs/day: 0.25    Years: 3.00    Pack years: 0.75    Types: Cigarettes    Quit date: 12/17/2016  Years since quitting: 4.4   Smokeless tobacco: Never  Vaping Use   Vaping Use: Never used  Substance Use Topics   Alcohol use: Not Currently    Comment: occasionally    Drug use: Not Currently    Types: Marijuana    Comment: last used 01/27/2019     Allergies   Flagyl [metronidazole], Other, Tomato, and Latex   Review of Systems Review of Systems  Constitutional:  Negative for appetite change and fever.  Respiratory: Negative.    Cardiovascular: Negative.   Gastrointestinal:  Positive for abdominal pain. Negative for constipation, diarrhea, nausea and vomiting.  Genitourinary:  Positive for hematuria, menstrual problem, pelvic pain and vaginal bleeding. Negative for decreased urine volume, difficulty urinating, dysuria, flank pain, frequency, urgency and vaginal discharge.  Musculoskeletal: Negative.   Skin: Negative.   Neurological: Negative.     Physical Exam Triage Vital Signs ED Triage Vitals  Enc Vitals Group      BP 05/21/21 1052 (!) 147/80     Pulse Rate 05/21/21 1052 96     Resp 05/21/21 1052 16     Temp 05/21/21 1052 99.4 F (37.4 C)     Temp Source 05/21/21 1052 Oral     SpO2 05/21/21 1052 95 %     Weight --      Height --      Head Circumference --      Peak Flow --      Pain Score 05/21/21 1051 6     Pain Loc --      Pain Edu? --      Excl. in GC? --    No data found.  Updated Vital Signs BP (!) 147/80 (BP Location: Left Arm)   Pulse 96   Temp 99.4 F (37.4 C) (Oral)   Resp 16   LMP 05/10/2021   SpO2 95%   Visual Acuity Right Eye Distance:   Left Eye Distance:   Bilateral Distance:    Right Eye Near:   Left Eye Near:    Bilateral Near:     Physical Exam   UC Treatments / Results  Labs (all labs ordered are listed, but only abnormal results are displayed) Labs Reviewed  POCT URINALYSIS DIPSTICK, ED / UC - Abnormal; Notable for the following components:      Result Value   Bilirubin Urine SMALL (*)    Ketones, ur TRACE (*)    Hgb urine dipstick MODERATE (*)    Protein, ur 30 (*)    Leukocytes,Ua SMALL (*)    All other components within normal limits  URINE CULTURE  POC URINE PREG, ED    EKG   Radiology No results found.  Procedures Procedures (including critical care time)  Medications Ordered in UC Medications - No data to display  Initial Impression / Assessment and Plan / UC Course  I have reviewed the triage vital signs and the nursing notes.  Pertinent labs & imaging results that were available during my care of the patient were reviewed by me and considered in my medical decision making (see chart for details).     You will need to follow up with obgyn   Take full dose of medications with food  Take tylenol as needed for pain  Preg test was negative  Final Clinical Impressions(s) / UC Diagnoses   Final diagnoses:  Vaginal bleeding  Generalized abdominal pain  Acute cystitis with hematuria     Discharge Instructions       Take  full dose of medications with food  Take tylenol as needed for pain      ED Prescriptions     Medication Sig Dispense Auth. Provider   cephALEXin (KEFLEX) 500 MG capsule Take 1 capsule (500 mg total) by mouth 4 (four) times daily. 20 capsule Coralyn Mark, NP      PDMP not reviewed this encounter.   Coralyn Mark, NP 05/21/21 1201

## 2021-05-21 NOTE — Discharge Instructions (Addendum)
Take full dose of medications with food  Take tylenol as needed for pain

## 2021-05-21 NOTE — ED Triage Notes (Signed)
Pt reports had menstrual cycle 9/1 then again on 9/22. Reports had lots of cramping and very heavy when second time came on. Reports still having bleeding when wipes. Reports abd tenderness.

## 2021-05-22 LAB — URINE CULTURE

## 2021-06-28 ENCOUNTER — Ambulatory Visit (HOSPITAL_BASED_OUTPATIENT_CLINIC_OR_DEPARTMENT_OTHER): Payer: Medicaid Other | Admitting: Family Medicine

## 2021-07-23 ENCOUNTER — Encounter (HOSPITAL_BASED_OUTPATIENT_CLINIC_OR_DEPARTMENT_OTHER): Payer: Self-pay | Admitting: Family Medicine

## 2023-12-08 ENCOUNTER — Emergency Department (HOSPITAL_COMMUNITY)
Admission: EM | Admit: 2023-12-08 | Discharge: 2023-12-08 | Disposition: A | Discharge status: Home / Self Care | Attending: Emergency Medicine | Admitting: Emergency Medicine

## 2023-12-08 ENCOUNTER — Encounter (HOSPITAL_COMMUNITY): Payer: Self-pay

## 2023-12-08 ENCOUNTER — Other Ambulatory Visit: Payer: Self-pay

## 2023-12-08 ENCOUNTER — Emergency Department (HOSPITAL_COMMUNITY)

## 2023-12-08 ENCOUNTER — Emergency Department (HOSPITAL_COMMUNITY): Admission: EM | Admit: 2023-12-08 | Source: Home / Self Care

## 2023-12-08 DIAGNOSIS — S43014A Anterior dislocation of right humerus, initial encounter: Secondary | ICD-10-CM | POA: Diagnosis not present

## 2023-12-08 DIAGNOSIS — X509XXA Other and unspecified overexertion or strenuous movements or postures, initial encounter: Secondary | ICD-10-CM | POA: Diagnosis not present

## 2023-12-08 DIAGNOSIS — M25511 Pain in right shoulder: Secondary | ICD-10-CM | POA: Diagnosis present

## 2023-12-08 DIAGNOSIS — S43004A Unspecified dislocation of right shoulder joint, initial encounter: Secondary | ICD-10-CM

## 2023-12-08 DIAGNOSIS — J45909 Unspecified asthma, uncomplicated: Secondary | ICD-10-CM | POA: Insufficient documentation

## 2023-12-08 DIAGNOSIS — Z91040 Latex allergy status: Secondary | ICD-10-CM | POA: Insufficient documentation

## 2023-12-08 MED ORDER — FENTANYL CITRATE PF 50 MCG/ML IJ SOSY
50.0000 ug | PREFILLED_SYRINGE | Freq: Once | INTRAMUSCULAR | Status: AC
  Administered 2023-12-08: 50 ug via INTRAVENOUS
  Filled 2023-12-08: qty 1

## 2023-12-08 MED ORDER — PROPOFOL 10 MG/ML IV BOLUS
INTRAVENOUS | Status: AC | PRN
  Administered 2023-12-08: 27 mg via INTRAVENOUS
  Administered 2023-12-08: 25 mg via INTRAVENOUS
  Administered 2023-12-08: 23 mg via INTRAVENOUS
  Administered 2023-12-08: 50 mg via INTRAVENOUS

## 2023-12-08 MED ORDER — IBUPROFEN 400 MG PO TABS
600.0000 mg | ORAL_TABLET | Freq: Once | ORAL | Status: AC
  Administered 2023-12-08: 600 mg via ORAL
  Filled 2023-12-08: qty 1

## 2023-12-08 MED ORDER — OXYCODONE-ACETAMINOPHEN 5-325 MG PO TABS
1.0000 | ORAL_TABLET | Freq: Once | ORAL | Status: AC
  Administered 2023-12-08: 1 via ORAL
  Filled 2023-12-08: qty 1

## 2023-12-08 MED ORDER — OXYCODONE-ACETAMINOPHEN 5-325 MG PO TABS: 1.0000 | ORAL_TABLET | Freq: Four times a day (QID) | ORAL | 0 refills | Status: DC | PRN

## 2023-12-08 MED ORDER — PROPOFOL 10 MG/ML IV BOLUS
0.5000 mg/kg | Freq: Once | INTRAVENOUS | Status: DC
  Filled 2023-12-08: qty 20
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
## 2023-12-08 NOTE — ED Provider Notes (Signed)
.  Reduction of dislocation  Date/Time: 12/08/2023 6:17 PM  Performed by: Lorain Robson, MD Authorized by: Mozell Arias, MD  Consent: Verbal consent obtained. Risks and benefits: risks, benefits and alternatives were discussed Consent given by: patient Patient identity confirmed: arm band Time out: Immediately prior to procedure a "time out" was called to verify the correct patient, procedure, equipment, support staff and site/side marked as required. Local anesthesia used: no  Anesthesia: Local anesthesia used: no  Sedation: Patient sedated: yes Sedatives: propofol  Sedation start date/time: 12/08/2023 6:03 PM Sedation end date/time: 12/08/2023 6:11 PM  Patient tolerance: patient tolerated the procedure well with no immediate complications   .Sedation  Date/Time: 12/08/2023 7:02 PM  Performed by: Lorain Robson, MD Authorized by: Mozell Arias, MD   Consent:    Consent obtained:  Verbal Universal protocol:    Immediately prior to procedure, a time out was called: yes     Patient identity confirmed:  Arm band Indications:    Procedure performed:  Dislocation reduction   Procedure necessitating sedation performed by:  Physician performing sedation Pre-sedation assessment:    Time since last food or drink:  18 hours   ASA classification: class 1 - normal, healthy patient     Mallampati score:  I - soft palate, uvula, fauces, pillars visible   Pre-sedation assessments completed and reviewed: airway patency, cardiovascular function, hydration status, mental status and nausea/vomiting   A pre-sedation assessment was completed prior to the start of the procedure Immediate pre-procedure details:    Reassessment: Patient reassessed immediately prior to procedure     Reviewed: vital signs     Verified: bag valve mask available, emergency equipment available, intubation equipment available, IV patency confirmed, oxygen available and suction available   Procedure details (see MAR  for exact dosages):    Preoxygenation:  Room air   Sedation:  Propofol    Intended level of sedation: deep   Analgesia:  Fentanyl    Intra-procedure monitoring:  Blood pressure monitoring, cardiac monitor, continuous capnometry, continuous pulse oximetry, frequent vital sign checks and frequent LOC assessments   Intra-procedure events: none     Total Provider sedation time (minutes):  11 Post-procedure details:   A post-sedation assessment was completed following the completion of the procedure.   Attendance: Constant attendance by certified staff until patient recovered     Recovery: Patient returned to pre-procedure baseline     Post-sedation assessments completed and reviewed: airway patency, hydration status, mental status, nausea/vomiting, pain level and respiratory function     Patient is stable for discharge or admission: yes     Procedure completion:  Tolerated well, no immediate complications     Lorain Robson, MD 12/08/23 Beather Liming    Mozell Arias, MD 12/08/23 2315

## 2023-12-08 NOTE — Progress Notes (Signed)
 Orthopedic Tech Progress Note Patient Details:  Sue Graves 11-Jul-1996 [id number]  Ortho Devices Type of Ortho Device: Sling immobilizer Ortho Device/Splint Location: RUE Ortho Device/Splint Interventions: Ordered, Application, Adjustment   Post Interventions Patient Tolerated: Well Instructions Provided: Care of device  Sue Graves 12/08/2023, 6:42 PM

## 2023-12-08 NOTE — ED Provider Notes (Signed)
 [place] [facility] Provider Note   CSN: [id number] Arrival date & time: 12/08/23  1448     History  Chief Complaint  Patient presents with   Shoulder Pain   Dislocation    Sue Graves is a 28 y.o. female.   Shoulder Pain Patient presents with right shoulder pain.  States she was stretching of her car and felt a pop.  Thinks it may have come out.  No other injury.  States does have some numbness in the right arm.    Past Medical History:  Diagnosis Date   Asthma    as a child , no inhaler   Depression    history - no meds   Missed abortion    no surgery required   Pregnancy induced hypertension     Home Medications Prior to Admission medications   Medication Sig Start Date End Date Taking? Authorizing Provider  oxyCODONE -acetaminophen  (PERCOCET/ROXICET) 5-325 MG tablet Take 1 tablet by mouth every 6 (six) hours as needed for severe pain (pain score 7-10). 12/08/23  Yes Mozell Arias, MD  cephALEXin  (KEFLEX ) 500 MG capsule Take 1 capsule (500 mg total) by mouth 4 (four) times daily. 05/21/21   Harden Leyden, NP  doxycycline  (VIBRAMYCIN ) 100 MG capsule Take 1 capsule (100 mg total) by mouth 2 (two) times daily. 08/17/19   Jerri Morale A, FNP  hydrochlorothiazide  (HYDRODIURIL ) 25 MG tablet Take 1 tablet (25 mg total) by mouth daily. 04/15/19   Smith, [place] , CNM  medroxyPROGESTERone  (DEPO-PROVERA ) 150 MG/ML injection Inject 1 mL (150 mg total) into the muscle every 3 (three) months. 07/05/19   Gabrielle Joiner, MD      Allergies    Flagyl  [metronidazole ], Other, Tomato, and Latex    Review of Systems   Review of Systems  Physical Exam Updated Vital Signs BP 121/71 (BP Location: Left Arm)   Pulse 79   Temp 98.3 F (36.8 C) (Oral)   Resp (!) 21   Ht 5' (1.524 m)   Wt 54 kg   LMP 11/11/2023 (Approximate)   SpO2 100%   BMI 23.25 kg/m  Physical Exam Vitals and nursing note reviewed.  Musculoskeletal:      Comments: Squaring of the right shoulder with anterior tenderness.  Strong radial pulse.  Neurological:     Mental Status: She is alert. Mental status is at baseline.     ED Results / Procedures / Treatments   Labs (all labs ordered are listed, but only abnormal results are displayed) Labs Reviewed  HCG, SERUM, QUALITATIVE    EKG None  Radiology DG Shoulder Right Portable Result Date: 12/08/2023 CLINICAL DATA:  Postreduction EXAM: RIGHT SHOULDER - 1 VIEW COMPARISON:  Same day radiographs FINDINGS: Successful reduction of the right glenohumeral. Question small impaction fracture about the superolateral humeral head. IMPRESSION: 1. Successful reduction of the right glenohumeral. 2. Question small impaction fracture about the superolateral humeral head. Electronically Signed   By: Rozell Cornet M.D.   On: 12/08/2023 20:19   DG Shoulder Right Result Date: 12/08/2023 CLINICAL DATA:  Suspected dislocation EXAM: RIGHT SHOULDER - 2+ VIEW COMPARISON:  None Available. FINDINGS: Anterior dislocation of the humeral head in relation to the glenoid. No definite acute fracture. IMPRESSION: Anterior dislocation of the humeral head. Electronically Signed   By: Rozell Cornet M.D.   On: 12/08/2023 20:17    Procedures Procedures    Medications Ordered in ED Medications  propofol  (DIPRIVAN ) 10 mg/mL bolus/IV push 27 mg (27  mg Intravenous See Procedure Record 12/08/23 1803)  oxyCODONE -acetaminophen  (PERCOCET/ROXICET) 5-325 MG per tablet 1 tablet (1 tablet Oral Given 12/08/23 1635)  ibuprofen  (ADVIL ) tablet 600 mg (600 mg Oral Given 12/08/23 1635)  fentaNYL  (SUBLIMAZE ) injection 50 mcg (50 mcg Intravenous Given 12/08/23 1727)  propofol  (DIPRIVAN ) 10 mg/mL bolus/IV push (25 mg Intravenous Given 12/08/23 1811)    ED Course/ Medical Decision Making/ A&P                                 Medical Decision Making Amount and/or Complexity of Data Reviewed Labs: ordered. Radiology:  ordered.  Risk Prescription drug management.   Patient with right shoulder dislocation.  Has strong pulse.  Does have some pain and paresthesias in the hand.  No trauma to that area directly.  Will get sedation and do reduction.  X-ray independently interpreted as a anterior dislocation.  Patient states she has not eaten today.  Shoulder reduced.  Feeling better.  Discharge home to follow-up with Ortho.        Final Clinical Impression(s) / ED Diagnoses Final diagnoses:  Dislocation of right shoulder joint, initial encounter    Rx / DC Orders ED Discharge Orders          Ordered    oxyCODONE -acetaminophen  (PERCOCET/ROXICET) 5-325 MG tablet  Every 6 hours PRN        12/08/23 1929              Mozell Arias, MD 12/08/23 2304

## 2023-12-08 NOTE — Progress Notes (Signed)
 Rt present for conscious sedation shoulder reduction.  Pt VS WNR throughout procedure.

## 2023-12-08 NOTE — ED Notes (Signed)
 Patient brother here to pick up patient. Writer laid eyes on brother.

## 2023-12-08 NOTE — ED Notes (Signed)
 Left the ed at this time

## 2024-01-24 ENCOUNTER — Encounter (HOSPITAL_COMMUNITY): Payer: Self-pay | Admitting: *Deleted

## 2024-01-24 ENCOUNTER — Inpatient Hospital Stay (HOSPITAL_COMMUNITY)

## 2024-01-24 ENCOUNTER — Inpatient Hospital Stay (HOSPITAL_COMMUNITY)
Admission: EM | Admit: 2024-01-24 | Discharge: 2024-02-01 | DRG: 447 | Disposition: A | Discharge status: Home / Self Care | Attending: Surgery | Admitting: Surgery

## 2024-01-24 ENCOUNTER — Emergency Department (HOSPITAL_COMMUNITY)

## 2024-01-24 ENCOUNTER — Other Ambulatory Visit: Payer: Self-pay

## 2024-01-24 DIAGNOSIS — S270XXA Traumatic pneumothorax, initial encounter: Secondary | ICD-10-CM | POA: Diagnosis present

## 2024-01-24 DIAGNOSIS — Z23 Encounter for immunization: Secondary | ICD-10-CM

## 2024-01-24 DIAGNOSIS — E876 Hypokalemia: Secondary | ICD-10-CM | POA: Diagnosis present

## 2024-01-24 DIAGNOSIS — M532X9 Spinal instabilities, site unspecified: Secondary | ICD-10-CM | POA: Diagnosis present

## 2024-01-24 DIAGNOSIS — S0081XA Abrasion of other part of head, initial encounter: Secondary | ICD-10-CM | POA: Diagnosis present

## 2024-01-24 DIAGNOSIS — S2243XA Multiple fractures of ribs, bilateral, initial encounter for closed fracture: Secondary | ICD-10-CM | POA: Diagnosis present

## 2024-01-24 DIAGNOSIS — Z91018 Allergy to other foods: Secondary | ICD-10-CM

## 2024-01-24 DIAGNOSIS — I7774 Dissection of vertebral artery: Secondary | ICD-10-CM | POA: Diagnosis present

## 2024-01-24 DIAGNOSIS — Z148 Genetic carrier of other disease: Secondary | ICD-10-CM | POA: Diagnosis not present

## 2024-01-24 DIAGNOSIS — S80811A Abrasion, right lower leg, initial encounter: Secondary | ICD-10-CM | POA: Diagnosis present

## 2024-01-24 DIAGNOSIS — S27322A Contusion of lung, bilateral, initial encounter: Secondary | ICD-10-CM | POA: Diagnosis present

## 2024-01-24 DIAGNOSIS — M48061 Spinal stenosis, lumbar region without neurogenic claudication: Secondary | ICD-10-CM | POA: Diagnosis present

## 2024-01-24 DIAGNOSIS — M4316 Spondylolisthesis, lumbar region: Secondary | ICD-10-CM | POA: Diagnosis present

## 2024-01-24 DIAGNOSIS — S32058A Other fracture of fifth lumbar vertebra, initial encounter for closed fracture: Principal | ICD-10-CM | POA: Diagnosis present

## 2024-01-24 DIAGNOSIS — Z833 Family history of diabetes mellitus: Secondary | ICD-10-CM

## 2024-01-24 DIAGNOSIS — R58 Hemorrhage, not elsewhere classified: Secondary | ICD-10-CM

## 2024-01-24 DIAGNOSIS — S7002XA Contusion of left hip, initial encounter: Secondary | ICD-10-CM | POA: Diagnosis present

## 2024-01-24 DIAGNOSIS — S71122A Laceration with foreign body, left thigh, initial encounter: Secondary | ICD-10-CM | POA: Diagnosis present

## 2024-01-24 DIAGNOSIS — S31020A Laceration with foreign body of lower back and pelvis without penetration into retroperitoneum, initial encounter: Secondary | ICD-10-CM | POA: Diagnosis present

## 2024-01-24 DIAGNOSIS — K683 Retroperitoneal hematoma: Secondary | ICD-10-CM | POA: Diagnosis present

## 2024-01-24 DIAGNOSIS — Z91040 Latex allergy status: Secondary | ICD-10-CM | POA: Diagnosis not present

## 2024-01-24 DIAGNOSIS — Z87891 Personal history of nicotine dependence: Secondary | ICD-10-CM | POA: Diagnosis not present

## 2024-01-24 DIAGNOSIS — Z883 Allergy status to other anti-infective agents status: Secondary | ICD-10-CM | POA: Diagnosis not present

## 2024-01-24 DIAGNOSIS — S32049A Unspecified fracture of fourth lumbar vertebra, initial encounter for closed fracture: Secondary | ICD-10-CM | POA: Diagnosis not present

## 2024-01-24 DIAGNOSIS — Y92410 Unspecified street and highway as the place of occurrence of the external cause: Secondary | ICD-10-CM

## 2024-01-24 DIAGNOSIS — S0301XA Dislocation of jaw, right side, initial encounter: Secondary | ICD-10-CM | POA: Diagnosis present

## 2024-01-24 DIAGNOSIS — M545 Low back pain, unspecified: Secondary | ICD-10-CM | POA: Diagnosis present

## 2024-01-24 DIAGNOSIS — M532X6 Spinal instabilities, lumbar region: Secondary | ICD-10-CM | POA: Diagnosis present

## 2024-01-24 DIAGNOSIS — S32048A Other fracture of fourth lumbar vertebra, initial encounter for closed fracture: Secondary | ICD-10-CM | POA: Diagnosis present

## 2024-01-24 DIAGNOSIS — S060X0A Concussion without loss of consciousness, initial encounter: Secondary | ICD-10-CM | POA: Diagnosis present

## 2024-01-24 DIAGNOSIS — Z79899 Other long term (current) drug therapy: Secondary | ICD-10-CM | POA: Diagnosis not present

## 2024-01-24 LAB — I-STAT CHEM 8, ED
BUN: 7 mg/dL (ref 6–20)
Calcium, Ion: 1.02 mmol/L — ABNORMAL LOW (ref 1.15–1.40)
Chloride: 101 mmol/L (ref 98–111)
Creatinine, Ser: 0.9 mg/dL (ref 0.44–1.00)
Glucose, Bld: 148 mg/dL — ABNORMAL HIGH (ref 70–99)
HCT: 38 % (ref 36.0–46.0)
Hemoglobin: 12.9 g/dL (ref 12.0–15.0)
Potassium: 2.3 mmol/L — CL (ref 3.5–5.1)
Sodium: 139 mmol/L (ref 135–145)
TCO2: 19 mmol/L — ABNORMAL LOW (ref 22–32)

## 2024-01-24 LAB — ETHANOL: Alcohol, Ethyl (B): <15 mg/dL (ref <15)

## 2024-01-24 LAB — CBC
HCT: 37.2 % (ref 36.0–46.0)
HCT: 30.6 % — ABNORMAL LOW (ref 36.0–46.0)
HCT: 32.4 % — ABNORMAL LOW (ref 36.0–46.0)
Hemoglobin: 12.5 g/dL (ref 12.0–15.0)
Hemoglobin: 10.6 g/dL — ABNORMAL LOW (ref 12.0–15.0)
Hemoglobin: 11.1 g/dL — ABNORMAL LOW (ref 12.0–15.0)
MCH: 31.9 pg (ref 26.0–34.0)
MCH: 32.5 pg (ref 26.0–34.0)
MCH: 32.8 pg (ref 26.0–34.0)
MCHC: 33.6 g/dL (ref 30.0–36.0)
MCHC: 34.6 g/dL (ref 30.0–36.0)
MCHC: 34.3 g/dL (ref 30.0–36.0)
MCV: 94.9 fL (ref 80.0–100.0)
MCV: 93.9 fL (ref 80.0–100.0)
MCV: 95.9 fL (ref 80.0–100.0)
Platelets: 352 10*3/uL (ref 150–400)
Platelets: 285 10*3/uL (ref 150–400)
Platelets: 251 10*3/uL (ref 150–400)
RBC: 3.92 MIL/uL (ref 3.87–5.11)
RBC: 3.26 MIL/uL — ABNORMAL LOW (ref 3.87–5.11)
RBC: 3.38 MIL/uL — ABNORMAL LOW (ref 3.87–5.11)
RDW: 12.7 % (ref 11.5–15.5)
RDW: 12.8 % (ref 11.5–15.5)
RDW: 13 % (ref 11.5–15.5)
WBC: 10.7 10*3/uL — ABNORMAL HIGH (ref 4.0–10.5)
WBC: 14.5 10*3/uL — ABNORMAL HIGH (ref 4.0–10.5)
WBC: 11.9 10*3/uL — ABNORMAL HIGH (ref 4.0–10.5)
nRBC: 0 % (ref 0.0–0.2)
nRBC: 0 % (ref 0.0–0.2)
nRBC: 0 % (ref 0.0–0.2)

## 2024-01-24 LAB — URINALYSIS, ROUTINE W REFLEX MICROSCOPIC
Bilirubin Urine: NEGATIVE
Glucose, UA: NEGATIVE mg/dL
Ketones, ur: NEGATIVE mg/dL
Nitrite: NEGATIVE
Protein, ur: NEGATIVE mg/dL
Specific Gravity, Urine: 1.023 (ref 1.005–1.030)
pH: 6 (ref 5.0–8.0)

## 2024-01-24 LAB — BASIC METABOLIC PANEL WITH GFR
Anion gap: 9 (ref 5–15)
BUN: 5 mg/dL — ABNORMAL LOW (ref 6–20)
CO2: 21 mmol/L — ABNORMAL LOW (ref 22–32)
Calcium: 8.4 mg/dL — ABNORMAL LOW (ref 8.9–10.3)
Chloride: 107 mmol/L (ref 98–111)
Creatinine, Ser: 0.65 mg/dL (ref 0.44–1.00)
GFR, Estimated: >60 mL/min (ref >60)
Glucose, Bld: 83 mg/dL (ref 70–99)
Potassium: 3.2 mmol/L — ABNORMAL LOW (ref 3.5–5.1)
Sodium: 137 mmol/L (ref 135–145)

## 2024-01-24 LAB — COMPREHENSIVE METABOLIC PANEL WITH GFR
ALT: 46 U/L — ABNORMAL HIGH (ref 0–44)
AST: 97 U/L — ABNORMAL HIGH (ref 15–41)
Albumin: 3.9 g/dL (ref 3.5–5.0)
Alkaline Phosphatase: 55 U/L (ref 38–126)
Anion gap: 16 — ABNORMAL HIGH (ref 5–15)
BUN: 8 mg/dL (ref 6–20)
CO2: 19 mmol/L — ABNORMAL LOW (ref 22–32)
Calcium: 9.2 mg/dL (ref 8.9–10.3)
Chloride: 103 mmol/L (ref 98–111)
Creatinine, Ser: 0.94 mg/dL (ref 0.44–1.00)
GFR, Estimated: >60 mL/min (ref >60)
Glucose, Bld: 150 mg/dL — ABNORMAL HIGH (ref 70–99)
Potassium: 2.3 mmol/L — CL (ref 3.5–5.1)
Sodium: 138 mmol/L (ref 135–145)
Total Bilirubin: 0.4 mg/dL (ref 0.0–1.2)
Total Protein: 6.7 g/dL (ref 6.5–8.1)

## 2024-01-24 LAB — PROTIME-INR
INR: 1.1 (ref 0.8–1.2)
Prothrombin Time: 14.7 s (ref 11.4–15.2)

## 2024-01-24 LAB — MRSA NEXT GEN BY PCR, NASAL: MRSA by PCR Next Gen: NOT DETECTED

## 2024-01-24 LAB — HCG, SERUM, QUALITATIVE: Preg, Serum: NEGATIVE

## 2024-01-24 LAB — SAMPLE TO BLOOD BANK

## 2024-01-24 LAB — PHOSPHORUS: Phosphorus: 2.9 mg/dL (ref 2.5–4.6)

## 2024-01-24 LAB — I-STAT CG4 LACTIC ACID, ED: Lactic Acid, Venous: 7.2 mmol/L (ref 0.5–1.9)

## 2024-01-24 LAB — HIV ANTIBODY (ROUTINE TESTING W REFLEX): HIV Screen 4th Generation wRfx: NONREACTIVE

## 2024-01-24 MED ORDER — IOHEXOL 350 MG/ML SOLN
75.0000 mL | Freq: Once | INTRAVENOUS | Status: AC | PRN
  Administered 2024-01-24: 75 mL via INTRAVENOUS

## 2024-01-24 MED ORDER — GABAPENTIN 300 MG PO CAPS
300.0000 mg | ORAL_CAPSULE | Freq: Three times a day (TID) | ORAL | Status: AC
Start: 2024-01-24 — End: ?
  Administered 2024-01-24 – 2024-01-29 (×15): 300 mg via ORAL
  Filled 2024-01-24 (×17): qty 1

## 2024-01-24 MED ORDER — POLYETHYLENE GLYCOL 3350 17 G PO PACK
17.0000 g | PACK | Freq: Every day | ORAL | Status: DC | PRN
  Administered 2024-01-27: 17 g via ORAL
  Filled 2024-01-24 (×2): qty 1

## 2024-01-24 MED ORDER — CHLORHEXIDINE GLUCONATE CLOTH 2 % EX PADS
6.0000 | MEDICATED_PAD | Freq: Every day | CUTANEOUS | Status: DC
  Administered 2024-01-24 – 2024-01-26 (×2): 6 via TOPICAL

## 2024-01-24 MED ORDER — HYDRALAZINE HCL 20 MG/ML IJ SOLN: 10.0000 mg | INTRAMUSCULAR | Status: DC | PRN

## 2024-01-24 MED ORDER — METHOCARBAMOL 500 MG PO TABS
500.0000 mg | ORAL_TABLET | Freq: Three times a day (TID) | ORAL | Status: DC
  Administered 2024-01-24 – 2024-01-26 (×4): 500 mg via ORAL
  Filled 2024-01-24 (×4): qty 1

## 2024-01-24 MED ORDER — ACETAMINOPHEN 500 MG PO TABS
1000.0000 mg | ORAL_TABLET | Freq: Four times a day (QID) | ORAL | Status: DC
  Administered 2024-01-24 – 2024-02-01 (×25): 1000 mg via ORAL
  Filled 2024-01-24 (×30): qty 2

## 2024-01-24 MED ORDER — MELATONIN 3 MG PO TABS: 3.0000 mg | ORAL_TABLET | Freq: Every evening | ORAL | Status: DC | PRN

## 2024-01-24 MED ORDER — METHOCARBAMOL 1000 MG/10ML IJ SOLN
500.0000 mg | Freq: Three times a day (TID) | INTRAMUSCULAR | Status: DC
  Administered 2024-01-24 (×2): 500 mg via INTRAVENOUS
  Filled 2024-01-24 (×2): qty 10

## 2024-01-24 MED ORDER — POTASSIUM CHLORIDE CRYS ER 20 MEQ PO TBCR
40.0000 meq | EXTENDED_RELEASE_TABLET | Freq: Once | ORAL | Status: DC
  Filled 2024-01-24: qty 2

## 2024-01-24 MED ORDER — POTASSIUM CHLORIDE 10 MEQ/100ML IV SOLN
10.0000 meq | INTRAVENOUS | Status: AC
  Administered 2024-01-24 (×4): 10 meq via INTRAVENOUS
  Filled 2024-01-24 (×4): qty 100

## 2024-01-24 MED ORDER — CALCIUM GLUCONATE-NACL 2-0.675 GM/100ML-% IV SOLN
2.0000 g | Freq: Once | INTRAVENOUS | Status: AC
  Administered 2024-01-24: 2000 mg via INTRAVENOUS
  Filled 2024-01-24: qty 100

## 2024-01-24 MED ORDER — METOPROLOL TARTRATE 5 MG/5ML IV SOLN: 5.0000 mg | Freq: Four times a day (QID) | INTRAVENOUS | Status: DC | PRN

## 2024-01-24 MED ORDER — TETANUS-DIPHTH-ACELL PERTUSSIS 5-2.5-18.5 LF-MCG/0.5 IM SUSY
0.5000 mL | PREFILLED_SYRINGE | Freq: Once | INTRAMUSCULAR | Status: AC
  Administered 2024-01-24: 0.5 mL via INTRAMUSCULAR
  Filled 2024-01-24: qty 0.5

## 2024-01-24 MED ORDER — HYDROMORPHONE HCL 1 MG/ML IJ SOLN: 0.5000 mg | INTRAMUSCULAR | Status: DC | PRN

## 2024-01-24 MED ORDER — SODIUM CHLORIDE 0.9 % IV SOLN: INTRAVENOUS | Status: AC

## 2024-01-24 MED ORDER — ONDANSETRON 4 MG PO TBDP: 4.0000 mg | ORAL_TABLET | Freq: Four times a day (QID) | ORAL | Status: DC | PRN

## 2024-01-24 MED ORDER — OXYCODONE HCL 5 MG PO TABS
5.0000 mg | ORAL_TABLET | ORAL | Status: DC | PRN
  Administered 2024-01-24: 5 mg via ORAL
  Filled 2024-01-24 (×3): qty 1

## 2024-01-24 MED ORDER — ONDANSETRON HCL 4 MG/2ML IJ SOLN: 4.0000 mg | Freq: Four times a day (QID) | INTRAMUSCULAR | Status: DC | PRN

## 2024-01-24 MED ORDER — LACTATED RINGERS IV BOLUS
1000.0000 mL | Freq: Once | INTRAVENOUS | Status: AC
  Administered 2024-01-24: 1000 mL via INTRAVENOUS

## 2024-01-24 MED ORDER — MAGNESIUM SULFATE IN D5W 1-5 GM/100ML-% IV SOLN
1.0000 g | Freq: Once | INTRAVENOUS | Status: AC
  Administered 2024-01-24: 1 g via INTRAVENOUS
  Filled 2024-01-24: qty 100

## 2024-01-24 MED ORDER — DOCUSATE SODIUM 100 MG PO CAPS
100.0000 mg | ORAL_CAPSULE | Freq: Two times a day (BID) | ORAL | Status: DC
  Administered 2024-01-24 – 2024-02-01 (×15): 100 mg via ORAL
  Filled 2024-01-24 (×15): qty 1

## 2024-01-24 MED ORDER — OXYCODONE HCL 5 MG PO TABS
10.0000 mg | ORAL_TABLET | ORAL | Status: DC | PRN
  Administered 2024-01-24 – 2024-01-29 (×13): 10 mg via ORAL
  Filled 2024-01-24 (×16): qty 2

## 2024-01-24 NOTE — ED Notes (Signed)
NeuroSurgery at bedside.

## 2024-01-24 NOTE — Progress Notes (Signed)
 PT Cancellation Note  Patient Details Name: Sue Graves MRN: [id number] DOB: [birth year]   Cancelled Treatment:    Reason Eval/Treat Not Completed: Patient not medically ready  Noted pt with L5 fracture and plans are for OR today. Will monitor for appropriateness to proceed with PT evaluation.    Gayle Kava, PT Acute Rehabilitation Services  Office [phone]  Guilford Leep 01/24/2024, 6:56 AM
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
## 2024-01-24 NOTE — Progress Notes (Addendum)
 Orthopedic Tech Progress Note Patient Details:  Sue Graves [birth year] [id number]  LSO is at bedside for when the pt is ready to use it/able to be OOB.  Ortho Devices Type of Ortho Device: Lumbar corsett Ortho Device/Splint Location: at bedside Ortho Device/Splint Interventions: Ordered      [name] 01/24/2024, 11:41 AM
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
## 2024-01-24 NOTE — ED Notes (Signed)
 [facility] called

## 2024-01-24 NOTE — Progress Notes (Signed)
 OT Cancellation Note  Patient Details Name: Sue Graves MRN: [id number] DOB: 1995-09-01   Cancelled Treatment:    Reason Eval/Treat Not Completed: Patient not medically ready (Noted pt with L5 fracture and plans are for OR today. OT will follow-up with pt as able)  01/24/2024  AB, OTR/L  Acute Rehabilitation Services  Office: [phone]   Sue Graves 01/24/2024, 7:30 AM
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
## 2024-01-25 ENCOUNTER — Inpatient Hospital Stay (HOSPITAL_COMMUNITY): Admitting: Anesthesiology

## 2024-01-25 ENCOUNTER — Inpatient Hospital Stay (HOSPITAL_COMMUNITY)

## 2024-01-25 ENCOUNTER — Other Ambulatory Visit: Payer: Self-pay

## 2024-01-25 ENCOUNTER — Encounter (HOSPITAL_COMMUNITY): Admission: EM | Disposition: A | Discharge status: Home / Self Care | Payer: Self-pay | Source: Home / Self Care

## 2024-01-25 ENCOUNTER — Encounter (HOSPITAL_COMMUNITY): Payer: Self-pay

## 2024-01-25 DIAGNOSIS — S32049A Unspecified fracture of fourth lumbar vertebra, initial encounter for closed fracture: Secondary | ICD-10-CM

## 2024-01-25 HISTORY — PX: LUMBAR LAMINECTOMY/DECOMPRESSION MICRODISCECTOMY: SHX5026

## 2024-01-25 LAB — ABO/RH: ABO/RH(D): B POS

## 2024-01-25 LAB — BASIC METABOLIC PANEL WITH GFR
Anion gap: 8 (ref 5–15)
BUN: <5 mg/dL — ABNORMAL LOW (ref 6–20)
CO2: 20 mmol/L — ABNORMAL LOW (ref 22–32)
Calcium: 8.5 mg/dL — ABNORMAL LOW (ref 8.9–10.3)
Chloride: 109 mmol/L (ref 98–111)
Creatinine, Ser: 0.74 mg/dL (ref 0.44–1.00)
GFR, Estimated: >60 mL/min (ref >60)
Glucose, Bld: 102 mg/dL — ABNORMAL HIGH (ref 70–99)
Potassium: 3.7 mmol/L (ref 3.5–5.1)
Sodium: 137 mmol/L (ref 135–145)

## 2024-01-25 LAB — CBC
HCT: 29 % — ABNORMAL LOW (ref 36.0–46.0)
Hemoglobin: 9.9 g/dL — ABNORMAL LOW (ref 12.0–15.0)
MCH: 32.9 pg (ref 26.0–34.0)
MCHC: 34.1 g/dL (ref 30.0–36.0)
MCV: 96.3 fL (ref 80.0–100.0)
Platelets: 252 10*3/uL (ref 150–400)
RBC: 3.01 MIL/uL — ABNORMAL LOW (ref 3.87–5.11)
RDW: 13.2 % (ref 11.5–15.5)
WBC: 10 10*3/uL (ref 4.0–10.5)
nRBC: 0 % (ref 0.0–0.2)

## 2024-01-25 LAB — MAGNESIUM: Magnesium: 1.7 mg/dL (ref 1.7–2.4)

## 2024-01-25 LAB — TYPE AND SCREEN
ABO/RH(D): B POS
Antibody Screen: NEGATIVE

## 2024-01-25 SURGERY — LUMBAR LAMINECTOMY/DECOMPRESSION MICRODISCECTOMY 2 LEVELS
Anesthesia: General | Site: Back

## 2024-01-25 MED ORDER — BACITRACIN ZINC 500 UNIT/GM EX OINT
TOPICAL_OINTMENT | CUTANEOUS | Status: DC | PRN
  Administered 2024-01-25: 1 via TOPICAL

## 2024-01-25 MED ORDER — LACTATED RINGERS IV SOLN: INTRAVENOUS | Status: DC

## 2024-01-25 MED ORDER — ONDANSETRON HCL 4 MG/2ML IJ SOLN
INTRAMUSCULAR | Status: AC
  Filled 2024-01-25: qty 2

## 2024-01-25 MED ORDER — DEXAMETHASONE SODIUM PHOSPHATE 10 MG/ML IJ SOLN
INTRAMUSCULAR | Status: AC
  Filled 2024-01-25: qty 1

## 2024-01-25 MED ORDER — PROPOFOL 10 MG/ML IV BOLUS
INTRAVENOUS | Status: AC
  Filled 2024-01-25: qty 20

## 2024-01-25 MED ORDER — ORAL CARE MOUTH RINSE: 15.0000 mL | OROMUCOSAL | Status: DC | PRN

## 2024-01-25 MED ORDER — CEFAZOLIN SODIUM 1 G IJ SOLR
INTRAMUSCULAR | Status: AC
  Filled 2024-01-25: qty 20

## 2024-01-25 MED ORDER — FENTANYL CITRATE (PF) 250 MCG/5ML IJ SOLN
INTRAMUSCULAR | Status: AC
  Filled 2024-01-25: qty 5

## 2024-01-25 MED ORDER — MIDAZOLAM HCL 2 MG/2ML IJ SOLN
INTRAMUSCULAR | Status: DC | PRN
  Administered 2024-01-25 (×2): 1 mg via INTRAVENOUS

## 2024-01-25 MED ORDER — ACETAMINOPHEN 10 MG/ML IV SOLN
INTRAVENOUS | Status: DC | PRN
  Administered 2024-01-25: 1000 mg via INTRAVENOUS

## 2024-01-25 MED ORDER — BACITRACIN ZINC 500 UNIT/GM EX OINT
TOPICAL_OINTMENT | CUTANEOUS | Status: AC
  Filled 2024-01-25: qty 28.35

## 2024-01-25 MED ORDER — BUPIVACAINE LIPOSOME 1.3 % IJ SUSP
INTRAMUSCULAR | Status: AC
Start: 2024-01-25 — End: ?
  Filled 2024-01-25: qty 20

## 2024-01-25 MED ORDER — LIDOCAINE 2% (20 MG/ML) 5 ML SYRINGE
INTRAMUSCULAR | Status: AC
  Filled 2024-01-25: qty 5

## 2024-01-25 MED ORDER — THROMBIN 20000 UNITS EX SOLR
CUTANEOUS | Status: AC
  Filled 2024-01-25: qty 20000

## 2024-01-25 MED ORDER — CHLORHEXIDINE GLUCONATE 0.12 % MT SOLN
15.0000 mL | Freq: Once | OROMUCOSAL | Status: AC
  Administered 2024-01-25: 15 mL via OROMUCOSAL

## 2024-01-25 MED ORDER — ORAL CARE MOUTH RINSE
15.0000 mL | Freq: Once | OROMUCOSAL | Status: AC
Start: 2024-01-25 — End: 2024-01-25

## 2024-01-25 MED ORDER — DEXAMETHASONE SODIUM PHOSPHATE 10 MG/ML IJ SOLN
INTRAMUSCULAR | Status: DC | PRN
  Administered 2024-01-25: 5 mg via INTRAVENOUS

## 2024-01-25 MED ORDER — ONDANSETRON HCL 4 MG/2ML IJ SOLN
INTRAMUSCULAR | Status: DC | PRN
  Administered 2024-01-25: 4 mg via INTRAVENOUS

## 2024-01-25 MED ORDER — VASHE WOUND IRRIGATION OPTIME
TOPICAL | Status: DC | PRN
  Administered 2024-01-25 (×2): 34 [oz_av]

## 2024-01-25 MED ORDER — FENTANYL CITRATE (PF) 250 MCG/5ML IJ SOLN
INTRAMUSCULAR | Status: DC | PRN
Start: 2024-01-25 — End: 2024-01-25
  Administered 2024-01-25: 150 ug via INTRAVENOUS
  Administered 2024-01-25: 50 ug via INTRAVENOUS

## 2024-01-25 MED ORDER — SUGAMMADEX SODIUM 200 MG/2ML IV SOLN
INTRAVENOUS | Status: DC | PRN
  Administered 2024-01-25: 125 mg via INTRAVENOUS

## 2024-01-25 MED ORDER — THROMBIN 5000 UNITS EX KIT
PACK | CUTANEOUS | Status: AC
  Filled 2024-01-25: qty 1

## 2024-01-25 MED ORDER — THROMBIN 20000 UNITS EX SOLR: CUTANEOUS | Status: DC | PRN

## 2024-01-25 MED ORDER — LIDOCAINE 2% (20 MG/ML) 5 ML SYRINGE
INTRAMUSCULAR | Status: DC | PRN
  Administered 2024-01-25: 50 mg via INTRAVENOUS

## 2024-01-25 MED ORDER — CEFAZOLIN SODIUM-DEXTROSE 2-3 GM-%(50ML) IV SOLR
INTRAVENOUS | Status: DC | PRN
Start: 2024-01-25 — End: 2024-01-25
  Administered 2024-01-25: 2 g via INTRAVENOUS

## 2024-01-25 MED ORDER — CHLORHEXIDINE GLUCONATE 0.12 % MT SOLN
OROMUCOSAL | Status: AC
  Filled 2024-01-25: qty 15

## 2024-01-25 MED ORDER — BUPIVACAINE-EPINEPHRINE (PF) 0.5% -1:200000 IJ SOLN
INTRAMUSCULAR | Status: AC
  Filled 2024-01-25: qty 30

## 2024-01-25 MED ORDER — BUPIVACAINE LIPOSOME 1.3 % IJ SUSP
INTRAMUSCULAR | Status: DC | PRN
  Administered 2024-01-25: 20 mL

## 2024-01-25 MED ORDER — ROCURONIUM BROMIDE 10 MG/ML (PF) SYRINGE
PREFILLED_SYRINGE | INTRAVENOUS | Status: AC
  Filled 2024-01-25: qty 10

## 2024-01-25 MED ORDER — 0.9 % SODIUM CHLORIDE (POUR BTL) OPTIME
TOPICAL | Status: DC | PRN
  Administered 2024-01-25: 1000 mL

## 2024-01-25 MED ORDER — THROMBIN 5000 UNITS EX SOLR: OROMUCOSAL | Status: DC | PRN

## 2024-01-25 MED ORDER — BUPIVACAINE-EPINEPHRINE (PF) 0.5% -1:200000 IJ SOLN
INTRAMUSCULAR | Status: DC | PRN
  Administered 2024-01-25: 10 mL

## 2024-01-25 MED ORDER — MIDAZOLAM HCL 2 MG/2ML IJ SOLN
INTRAMUSCULAR | Status: AC
Start: 2024-01-25 — End: ?
  Filled 2024-01-25: qty 2

## 2024-01-25 MED ORDER — PROPOFOL 10 MG/ML IV BOLUS
INTRAVENOUS | Status: DC | PRN
  Administered 2024-01-25: 150 mg via INTRAVENOUS

## 2024-01-25 MED ORDER — DEXMEDETOMIDINE HCL IN NACL 80 MCG/20ML IV SOLN
INTRAVENOUS | Status: DC | PRN
  Administered 2024-01-25: 8 ug via INTRAVENOUS

## 2024-01-25 MED ORDER — ROCURONIUM BROMIDE 10 MG/ML (PF) SYRINGE
PREFILLED_SYRINGE | INTRAVENOUS | Status: DC | PRN
  Administered 2024-01-25: 10 mg via INTRAVENOUS
  Administered 2024-01-25: 50 mg via INTRAVENOUS

## 2024-01-25 SURGICAL SUPPLY — 57 items
BAG COUNTER SPONGE SURGICOUNT (BAG) ×1 IMPLANT
BAND RUBBER #18 3X1/16 STRL (MISCELLANEOUS) ×2 IMPLANT
BASKET BONE COLLECTION (BASKET) IMPLANT
BENZOIN TINCTURE PRP APPL 2/3 (GAUZE/BANDAGES/DRESSINGS) ×1 IMPLANT
BLADE CLIPPER SURG (BLADE) IMPLANT
BUR MATCHSTICK NEURO 3.0 LAGG (BURR) ×1 IMPLANT
BUR PRECISION FLUTE 6.0 (BURR) ×1 IMPLANT
CANISTER SUCTION 3000ML PPV (SUCTIONS) ×1 IMPLANT
CAP LOCK DLX THRD (Cap) IMPLANT
DRAPE LAPAROTOMY 100X72X124 (DRAPES) ×1 IMPLANT
DRAPE MICROSCOPE SLANT 54X150 (MISCELLANEOUS) ×1 IMPLANT
DRAPE SURG 17X23 STRL (DRAPES) ×4 IMPLANT
DRSG OPSITE 4X5.5 SM (GAUZE/BANDAGES/DRESSINGS) IMPLANT
DRSG OPSITE POSTOP 4X6 (GAUZE/BANDAGES/DRESSINGS) ×1 IMPLANT
ELECTRODE BLDE 4.0 EZ CLN MEGD (MISCELLANEOUS) ×1 IMPLANT
ELECTRODE REM PT RTRN 9FT ADLT (ELECTROSURGICAL) ×1 IMPLANT
GAUZE 4X4 16PLY ~~LOC~~+RFID DBL (SPONGE) IMPLANT
GAUZE SPONGE 4X4 12PLY STRL (GAUZE/BANDAGES/DRESSINGS) ×1 IMPLANT
GLOVE BIO SURGEON STRL SZ 6 (GLOVE) ×1 IMPLANT
GLOVE BIO SURGEON STRL SZ8 (GLOVE) ×1 IMPLANT
GLOVE BIO SURGEON STRL SZ8.5 (GLOVE) ×1 IMPLANT
GLOVE BIOGEL PI IND STRL 6.5 (GLOVE) ×1 IMPLANT
GLOVE BIOGEL PI IND STRL 7.5 (GLOVE) IMPLANT
GLOVE BIOGEL PI IND STRL 8.5 (GLOVE) IMPLANT
GLOVE EXAM NITRILE XL STR (GLOVE) IMPLANT
GLOVE SURG SS PI 8.0 STRL IVOR (GLOVE) IMPLANT
GOWN STRL REUS W/ TWL LRG LVL3 (GOWN DISPOSABLE) ×1 IMPLANT
GOWN STRL REUS W/ TWL XL LVL3 (GOWN DISPOSABLE) ×1 IMPLANT
GOWN STRL REUS W/TWL 2XL LVL3 (GOWN DISPOSABLE) IMPLANT
GRAFT TRINITY ELITE LGE HUMAN (Tissue) IMPLANT
HEMOSTAT POWDER KIT SURGIFOAM (HEMOSTASIS) ×1 IMPLANT
KIT BASIN OR (CUSTOM PROCEDURE TRAY) ×1 IMPLANT
KIT TURNOVER KIT B (KITS) ×1 IMPLANT
NDL HYPO 21X1.5 SAFETY (NEEDLE) IMPLANT
NDL HYPO 22X1.5 SAFETY MO (MISCELLANEOUS) ×1 IMPLANT
NEEDLE HYPO 21X1.5 SAFETY (NEEDLE) ×1 IMPLANT
NEEDLE HYPO 22X1.5 SAFETY MO (MISCELLANEOUS) ×1 IMPLANT
NS IRRIG 1000ML POUR BTL (IV SOLUTION) ×1 IMPLANT
PACK LAMINECTOMY NEURO (CUSTOM PROCEDURE TRAY) ×1 IMPLANT
PAD ARMBOARD POSITIONER FOAM (MISCELLANEOUS) ×3 IMPLANT
PATTIES SURGICAL .5 X.5 (GAUZE/BANDAGES/DRESSINGS) ×1 IMPLANT
PATTIES SURGICAL .5 X1 (DISPOSABLE) IMPLANT
PATTIES SURGICAL 1X1 (DISPOSABLE) ×1 IMPLANT
ROD CURVED TI 6.35X60 (Rod) IMPLANT
SCREW PA DLX CREO 7.5X40 (Screw) IMPLANT
SCREW PA DLX CREO 7.5X45 (Screw) IMPLANT
SPONGE SURGIFOAM ABS GEL SZ50 (HEMOSTASIS) IMPLANT
STRIP CLOSURE SKIN 1/2X4 (GAUZE/BANDAGES/DRESSINGS) ×1 IMPLANT
SUT VIC AB 0 CT1 18XCR BRD8 (SUTURE) IMPLANT
SUT VIC AB 1 CT1 18XBRD ANBCTR (SUTURE) ×2 IMPLANT
SUT VIC AB 2-0 CP2 18 (SUTURE) ×2 IMPLANT
SUT VIC AB 3-0 SH 8-18 (SUTURE) IMPLANT
SYR 20ML LL LF (SYRINGE) IMPLANT
SYR 30ML SLIP (SYRINGE) ×1 IMPLANT
TOWEL GREEN STERILE (TOWEL DISPOSABLE) ×1 IMPLANT
TOWEL GREEN STERILE FF (TOWEL DISPOSABLE) ×1 IMPLANT
WATER STERILE IRR 1000ML POUR (IV SOLUTION) ×1 IMPLANT

## 2024-01-25 NOTE — Progress Notes (Signed)
 OT Cancellation Note  Patient Details Name: Sue Graves MRN: [id number] DOB: 07/31/1996   Cancelled Treatment:    Reason Eval/Treat Not Completed: Patient at procedure or test/ unavailable Pt in OR. Will return as schedule allows.  Karilyn Ouch, OTR/L [facility] Office: [phone]   [name] 01/25/2024, 10:27 AM

## 2024-01-25 NOTE — Anesthesia Procedure Notes (Signed)
 Procedure Name: Intubation Date/Time: 01/25/2024 9:55 AM  Performed by: Basilio Limb, CRNAPre-anesthesia Checklist: Patient identified, Emergency Drugs available, Suction available and Patient being monitored Patient Re-evaluated:Patient Re-evaluated prior to induction Oxygen Delivery Method: Circle System Utilized Preoxygenation: Pre-oxygenation with 100% oxygen Induction Type: IV induction Ventilation: Mask ventilation without difficulty Laryngoscope Size: Miller and 3 Grade View: Grade I Tube type: Oral Tube size: 7.0 mm Number of attempts: 1 Airway Equipment and Method: Stylet and Oral airway Placement Confirmation: ETT inserted through vocal cords under direct vision, positive ETCO2 and breath sounds checked- equal and bilateral Secured at: 21 cm Tube secured with: Tape Dental Injury: Teeth and Oropharynx as per pre-operative assessment
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
## 2024-01-25 NOTE — Progress Notes (Signed)
 Pt signed the consent form but would like the MD to talk to her mom before the procedure.

## 2024-01-25 NOTE — Transfer of Care (Signed)
 Immediate Anesthesia Transfer of Care Note  Patient: Daana Kennerson  Procedure(s) Performed: LUMBAR FOUR TO SACRUM INSTRUMENTATION FUSION (Back)  Patient Location: PACU  Anesthesia Type:General  Level of Consciousness: drowsy  Airway & Oxygen Therapy: Patient Spontanous Breathing and Patient connected to face mask oxygen  Post-op Assessment: Report given to RN and Post -op Vital signs reviewed and stable  Post vital signs: Reviewed and stable  Last Vitals:  Vitals Value Taken Time  BP 118/63 01/25/24 1230  Temp    Pulse 74 01/25/24 1234  Resp 18 01/25/24 1234  SpO2 100 % 01/25/24 1234  Vitals shown include unfiled device data.  Last Pain:  Vitals:   01/25/24 0802  TempSrc: Oral  PainSc:          Complications: No notable events documented.
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
## 2024-01-25 NOTE — Progress Notes (Signed)
 PT Cancellation Note  Patient Details Name: Sue Graves MRN: [id number] DOB: 1996-07-01   Cancelled Treatment:    Reason Eval/Treat Not Completed: Patient at procedure or test/unavailable, in OR for back surgery. Will continue to follow and evaluate as appropriate.   [name], PT, DPT   Acute Rehabilitation Department Office [phone] Secure Chat Communication Preferred   [name] 01/25/2024, 10:01 AM

## 2024-01-26 LAB — CBC
HCT: 28.4 % — ABNORMAL LOW (ref 36.0–46.0)
Hemoglobin: 9.6 g/dL — ABNORMAL LOW (ref 12.0–15.0)
MCH: 32.3 pg (ref 26.0–34.0)
MCHC: 33.8 g/dL (ref 30.0–36.0)
MCV: 95.6 fL (ref 80.0–100.0)
Platelets: 234 10*3/uL (ref 150–400)
RBC: 2.97 MIL/uL — ABNORMAL LOW (ref 3.87–5.11)
RDW: 13.2 % (ref 11.5–15.5)
WBC: 15.1 10*3/uL — ABNORMAL HIGH (ref 4.0–10.5)
nRBC: 0 % (ref 0.0–0.2)

## 2024-01-26 MED ORDER — METHOCARBAMOL 500 MG PO TABS
1000.0000 mg | ORAL_TABLET | Freq: Three times a day (TID) | ORAL | Status: DC
  Administered 2024-01-26 – 2024-01-30 (×12): 1000 mg via ORAL
  Filled 2024-01-26 (×12): qty 2

## 2024-01-26 MED ORDER — CALCIUM GLUCONATE-NACL 1-0.675 GM/50ML-% IV SOLN
1.0000 g | Freq: Once | INTRAVENOUS | Status: AC
  Administered 2024-01-26: 1000 mg via INTRAVENOUS
  Filled 2024-01-26: qty 50

## 2024-01-26 MED ORDER — HYDROMORPHONE HCL 1 MG/ML IJ SOLN
0.5000 mg | Freq: Four times a day (QID) | INTRAMUSCULAR | Status: DC | PRN
  Administered 2024-01-27 – 2024-01-29 (×5): 0.5 mg via INTRAVENOUS
  Filled 2024-01-26 (×5): qty 0.5

## 2024-01-26 MED ORDER — ENOXAPARIN SODIUM 30 MG/0.3ML IJ SOSY
30.0000 mg | PREFILLED_SYRINGE | Freq: Two times a day (BID) | INTRAMUSCULAR | Status: DC
  Administered 2024-01-27 – 2024-01-31 (×9): 30 mg via SUBCUTANEOUS
  Filled 2024-01-26 (×11): qty 0.3

## 2024-01-26 MED ORDER — ENOXAPARIN SODIUM 30 MG/0.3ML IJ SOSY: 30.0000 mg | PREFILLED_SYRINGE | Freq: Two times a day (BID) | INTRAMUSCULAR | Status: DC

## 2024-01-26 NOTE — Progress Notes (Signed)
 PT Cancellation Note  Patient Details Name: Sue Graves MRN: [id number] DOB: 1995-09-17   Cancelled Treatment:    Reason Eval/Treat Not Completed: Other (comment) Per Neurosurgery, pt can mobilize in TLSO, but the brace is not present. [employer] and RN aware. Will continue to be available for evaluation when brace arrives.   [name], PT, DPT   Acute Rehabilitation Department Office [phone] Secure Chat Communication Preferred   [name] 01/26/2024, 9:51 AM
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
## 2024-01-27 ENCOUNTER — Encounter (HOSPITAL_COMMUNITY): Payer: Self-pay | Admitting: Neurosurgery

## 2024-01-27 LAB — CBC
HCT: 29.9 % — ABNORMAL LOW (ref 36.0–46.0)
Hemoglobin: 10.2 g/dL — ABNORMAL LOW (ref 12.0–15.0)
MCH: 32.3 pg (ref 26.0–34.0)
MCHC: 34.1 g/dL (ref 30.0–36.0)
MCV: 94.6 fL (ref 80.0–100.0)
Platelets: 263 10*3/uL (ref 150–400)
RBC: 3.16 MIL/uL — ABNORMAL LOW (ref 3.87–5.11)
RDW: 13.3 % (ref 11.5–15.5)
WBC: 13.1 10*3/uL — ABNORMAL HIGH (ref 4.0–10.5)
nRBC: 0 % (ref 0.0–0.2)

## 2024-01-27 LAB — BASIC METABOLIC PANEL WITH GFR
Anion gap: 13 (ref 5–15)
BUN: 5 mg/dL — ABNORMAL LOW (ref 6–20)
CO2: 25 mmol/L (ref 22–32)
Calcium: 9.1 mg/dL (ref 8.9–10.3)
Chloride: 100 mmol/L (ref 98–111)
Creatinine, Ser: 0.7 mg/dL (ref 0.44–1.00)
GFR, Estimated: >60 mL/min (ref >60)
Glucose, Bld: 118 mg/dL — ABNORMAL HIGH (ref 70–99)
Potassium: 3.2 mmol/L — ABNORMAL LOW (ref 3.5–5.1)
Sodium: 138 mmol/L (ref 135–145)

## 2024-01-27 MED ORDER — POTASSIUM CHLORIDE 20 MEQ PO PACK
20.0000 meq | PACK | Freq: Once | ORAL | Status: DC
  Filled 2024-01-27: qty 1

## 2024-01-27 MED ORDER — POTASSIUM CHLORIDE 20 MEQ PO PACK
40.0000 meq | PACK | Freq: Once | ORAL | Status: DC
  Filled 2024-01-27: qty 2

## 2024-01-27 MED ORDER — POLYETHYLENE GLYCOL 3350 17 G PO PACK
17.0000 g | PACK | Freq: Every day | ORAL | Status: DC
  Administered 2024-01-27 – 2024-01-29 (×3): 17 g via ORAL
  Filled 2024-01-27 (×3): qty 1

## 2024-01-27 MED ORDER — BISACODYL 10 MG RE SUPP
10.0000 mg | Freq: Once | RECTAL | Status: AC
  Administered 2024-01-27: 10 mg via RECTAL
  Filled 2024-01-27: qty 1

## 2024-01-27 MED ORDER — POTASSIUM CHLORIDE 20 MEQ PO PACK
60.0000 meq | PACK | Freq: Once | ORAL | Status: DC
  Filled 2024-01-27: qty 3

## 2024-01-27 MED ORDER — POTASSIUM CHLORIDE 10 MEQ/100ML IV SOLN
10.0000 meq | INTRAVENOUS | Status: AC
  Administered 2024-01-27 (×4): 10 meq via INTRAVENOUS
  Filled 2024-01-27 (×4): qty 100

## 2024-01-27 MED ORDER — POTASSIUM CHLORIDE CRYS ER 20 MEQ PO TBCR
40.0000 meq | EXTENDED_RELEASE_TABLET | Freq: Once | ORAL | Status: DC
  Filled 2024-01-27: qty 2
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
## 2024-01-27 NOTE — Plan of Care (Signed)

## 2024-01-27 NOTE — Plan of Care (Signed)

## 2024-01-27 NOTE — TOC CAGE-AID Note (Signed)
 Transition of Care [facility]) - CAGE-AID Screening   Patient Details  Name: Sue Graves MRN: [id number] Date of Birth: 1996-02-07  [facility] of [facility]) CM/SW Contact:    Avionna Bower E Earlee Herald, LCSW Phone Number: 01/27/2024, 2:37 PM   Clinical Narrative: Denies alcohol use. Reports smoking THC. Denies need for resources.   CAGE-AID Screening:    Have You Ever Felt You Ought to Cut Down on Your Drinking or Drug Use?: No Have People Annoyed You By Critizing Your Drinking Or Drug Use?: No Have You Felt Bad Or Guilty About Your Drinking Or Drug Use?: No Have You Ever Had a Drink or Used Drugs First Thing In The Morning to Steady Your Nerves or to Get Rid of a Hangover?: No CAGE-AID Score: 0  Substance Abuse Education Offered: Yes

## 2024-01-27 NOTE — Progress Notes (Signed)
 Orthopedic Tech Progress Note Patient Details:  Sue Graves 20-Mar-1996 [id number]  Ortho Devices Type of Ortho Device: Thoracolumbar corset (TLSO) Ortho Device/Splint Location: BACK Ortho Device/Splint Interventions: Ordered, Adjustment Yesterday while patient was on 4N ICU she was given a LSO because while she was in the ED she was given a LSO BRACE that didn't make it upstairs to her new room, so I gave her a new one, this morning she needed a TLSO BACK BRACE, dropped off to room, made sure it was a small fitted brace.(Patient was in bed with her LSO BRACE on while sleeping)    Post Interventions Patient Tolerated: Well Instructions Provided: Care of device  Kermitt Pedlar 01/27/2024, 8:43 AM

## 2024-01-27 NOTE — Anesthesia Preprocedure Evaluation (Signed)
 Anesthesia Evaluation  Patient identified by MRN, date of birth, ID band Patient awake    Reviewed: Allergy & Precautions, H&P , NPO status , Patient's Chart, lab work & pertinent test results  Airway Mallampati: II   Neck ROM: full    Dental   Pulmonary asthma , Patient abstained from smoking., former smoker   breath sounds clear to auscultation       Cardiovascular hypertension,  Rhythm:regular Rate:Normal     Neuro/Psych  PSYCHIATRIC DISORDERS  Depression       GI/Hepatic   Endo/Other    Renal/GU      Musculoskeletal   Abdominal   Peds  Hematology   Anesthesia Other Findings   Reproductive/Obstetrics                              Anesthesia Physical Anesthesia Plan  ASA: 2  Anesthesia Plan: General   Post-op Pain Management:    Induction: Intravenous  PONV Risk Score and Plan: 3 and Ondansetron , Dexamethasone , Midazolam  and Treatment may vary due to age or medical condition  Airway Management Planned: Oral ETT  Additional Equipment:   Intra-op Plan:   Post-operative Plan: Extubation in OR  Informed Consent: I have reviewed the patients History and Physical, chart, labs and discussed the procedure including the risks, benefits and alternatives for the proposed anesthesia with the patient or authorized representative who has indicated his/her understanding and acceptance.     Dental advisory given  Plan Discussed with: CRNA, Anesthesiologist and Surgeon  Anesthesia Plan Comments:          Anesthesia Quick Evaluation
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
## 2024-01-27 NOTE — Progress Notes (Signed)
 Subjective: The patient is somnolent.  She is easily arousable.  She has recently been given pain medications.  Her boyfriend is at the bedside.  Objective: Vital signs in last 24 hours: Temp:  [98 F (36.7 C)-99.5 F (37.5 C)] 98 F (36.7 C) (06/10 0439) Pulse Rate:  [68-99] 86 (06/10 0439) Resp:  [14-29] 16 (06/10 0439) BP: (125-140)/(64-89) 137/82 (06/10 0439) SpO2:  [98 %-100 %] 100 % (06/10 0439) Estimated body mass index is 24.03 kg/m as calculated from the following:   Height as of this encounter: 4' 11.02" (1.499 m).   Weight as of this encounter: 54 kg.   Intake/Output from previous day: 06/09 0701 - 06/10 0700 In: 450.1 [P.O.:400; IV Piggyback:50.1] Out: 35 [Drains:35] Intake/Output this shift: No intake/output data recorded.  Physical exam the patient is moving her lower extremities well.  Lab Results: Recent Labs    01/25/24 0419 01/26/24 0946  WBC 10.0 15.1*  HGB 9.9* 9.6*  HCT 29.0* 28.4*  PLT 252 234   BMET Recent Labs    01/24/24 1620 01/25/24 0419  NA 137 137  K 3.2* 3.7  CL 107 109  CO2 21* 20*  GLUCOSE 83 102*  BUN 5* <5*  CREATININE 0.65 0.74  CALCIUM 8.4* 8.5*    Studies/Results: DG Lumbar Spine 2-3 Views Result Date: 01/25/2024 CLINICAL DATA:  Elective surgery. EXAM: LUMBAR SPINE - 2-3 VIEW COMPARISON:  Reformats from abdominopelvic CT yesterday FINDINGS: Four fluoroscopic spot views of the lumbar spine submitted from the operating room. Posterior fusion L4-S1 fixating L5 fracture. Fluoroscopy time 28.4 seconds. Dose 9.6 mGy. IMPRESSION: Intraoperative fluoroscopy during lumbar fusion. Electronically Signed   By: Chadwick Colonel M.D.   On: 01/25/2024 12:13   DG C-Arm 1-60 Min-No Report Result Date: 01/25/2024 Fluoroscopy was utilized by the requesting physician.  No radiographic interpretation.   DG C-Arm 1-60 Min-No Report Result Date: 01/25/2024 Fluoroscopy was utilized by the requesting physician.  No radiographic interpretation.     Assessment/Plan: Postop day #2: The patient is doing well.  She can be mobilized in her TLSO.  LOS: 3 days     Sue Graves 01/27/2024, 7:49 AM     Patient ID: Sue Graves, female   DOB: April 30, 1996, 30 y.o.   MRN: [id number]

## 2024-01-27 NOTE — Anesthesia Postprocedure Evaluation (Signed)
 Anesthesia Post Note  Patient: Sue Graves  Procedure(s) Performed: LUMBAR FOUR TO SACRUM INSTRUMENTATION FUSION (Back)     Patient location during evaluation: PACU Anesthesia Type: General Level of consciousness: awake and alert Pain management: pain level controlled Vital Signs Assessment: post-procedure vital signs reviewed and stable Respiratory status: spontaneous breathing, nonlabored ventilation, respiratory function stable and patient connected to nasal cannula oxygen Cardiovascular status: blood pressure returned to baseline and stable Postop Assessment: no apparent nausea or vomiting Anesthetic complications: no   No notable events documented.  Last Vitals:  Vitals:   01/27/24 0439 01/27/24 0801  BP: 137/82 124/73  Pulse: 86 90  Resp: 16 16  Temp: 36.7 C 37.1 C  SpO2: 100% 100%    Last Pain:  Vitals:   01/27/24 0801  TempSrc: Oral  PainSc:                  Hristopher Missildine S
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
## 2024-01-27 NOTE — Discharge Instructions (Addendum)
 Medicaid transportation contact - [employer] Marvelyn Slim - [facility] [phone]  [facility] assistance programs Crisis assistance programs  -[facility] Coordinated Entry Program. If you are experiencing homelessness in [place], [place] , your first point of contact should be Pensions consultant. You can reach Coordinated Entry by calling (336) ([phone]) [phone] or by emailing [email or web address] [email or web address].  Community access points: [employer] ([phone], [place]) every Tuesday from 9am-10am. [facility] ([address], [place]) every Wednesday from 8am-9am.   -[place] Coordinated Re-entry Jayson Michael: Dial 211 and request. Offers referrals to homeless shelters in the area.    -The [employer] 304-285-7787) offers several services to local families, as funding allows. The Emergency Assistance Program (EAP), which they administer, provides household goods, free food, clothing, and financial aid to people in need in the [place]  area. The EAP program does have some qualification, and counselors will interview clients for financial assistance by written referral only. Referrals need to be made by the Department of Social Services or by other EAP approved human services agencies or charities in the area.  -[facility] of [employer], which can be reached at ([phone] [phone], offers emergency assistance programs for those in need of help, such as food, rent assistance, a soup kitchen, shelter, and clothing. They are based in [place]  but provide a number of services to those that qualify for assistance.   [facility] Department of Social Services may be able to offer temporary financial assistance and cash grants for paying rent and utilities, Help may be provided for local county residents who may be experiencing personal crisis when other resources, including government programs, are not  available. Call ([phone]  -High [employer] is a [employer] agency, The organization can offer emergency assistance for paying rent, Care[employer] Rx, utilities, food, household products and furniture. They offer extensive emergency and transitional housing for families, children and single women, and also run a Boy's and [employer]. Thrift Shops, Secondary school teacher, and other aid offered too. [address], [place], [place]  [address], [phone]  -[place] Low Income Energy Assistance Program -- This is offered for [facility] families. The federal government created [employer] Program provides a one-time cash grant payment to help eligible low-income families pay their electric and heating bills. [address] [place], [place]  [address], [phone]  -[place] Emergency Assistance -- A program offers emergency utility and rent funds for greater [employer] area residents. The program can also provide counseling and referrals to charities and government programs. Also provides food and a free meal program that serves lunch Mondays - Saturdays and dinner seven days per week to individuals in the community. [address], [place], [place]  [address], [phone]  -[employer] - Offers affordable apartment and housing communities across      [place] and [place]. The low income and seniors can access public housing, rental assistance to qualified applicants, and apply for the section 8 rent subsidy program. Other programs include Chiropractor and Engineer, maintenance. [address] [place],   [address], dial [phone].  -The [facility] provides transitional housing to veterans and the disabled. Clients will also access other services too, including assistance in applying for Disability, life skills classes, case management, and  assistance in finding permanent housing. [address] [place], [place]  [address], call [phone]  -[place] Transitional Housing  through [employer] is for people who were just evicted or that are formerly homeless. The non-profit will also help then gain self-sufficiency, find a home or apartment to live in, and also provides information on rent assistance when needed. Phone ([phone]  -The [place] [facility] [facility] helps low income, elderly, or disabled residents in seven counties in the [place] ([place], [place], [place], [place], [place], [place], [place], and [place]) save energy and reduce their utility bills by improving energy efficiency. Phone [phone].  -[employer] is located in the [place] Housing [facility] in the [employer], [address], [place], [place] [address]. Parking is in the rear of the building. Phone: ([phone]   General Email: info@gsohc .org  GHC provides free housing counseling assistance in locating affordable rental housing or housing with support services for families and individuals in crisis and the chronically homeless. We provide potential resources for other housing needs like utilities. Our trained counselors also work with clients on budgeting and financial literacy in effort to empower them to take control of their financial situations. [employer] collaborates with homeless service providers and other stakeholders as part of the [employer] COC (Continuum of Care). The (COC) is a regional/local planning body that coordinates housing and services funding for homeless families and individuals. The role of [facility] in the COC is through housing counseling to work with people we serve on diversion strategies for those that are at imminent risk of becoming homeless. We also work with the Coordinated  Assessment/Entry Specialist who attempts to find temporary solutions and/or connects the people to Housing First, Rapid Re-housing or transitional housing programs. Our Homelessness Prevention Housing Counselors meet with clients on business days (Monday-Fridays, except scheduled holidays) from 8:30 am to 4:30 pm.  Legal assistance for evictions, foreclosure, and more -If you need free legal advice on civil issues, such as foreclosures, evictions, Electronics engineer, government programs, domestic issues and more, Landscape architect of [facility]) is a Associate Professor firm that provides free legal services and counsel to lower income people, seniors, disabled, and others, The goal is to ensure everyone has access to justice and fair representation. Call them at [phone].  [facility] for Housing and Community Studies can provide info about obtaining legal assistance with evictions. Phone ([phone].  Data processing manager  The [employer], [employer]. offers job and Dispensing optician. Resources are focused on helping students obtain the skills and experiences that are necessary to compete in today's challenging and tight job market. The non-profit faith-based community action agency offers internship trainings as well as classroom instruction. Classes are tailored to meet the needs of people in the [facility] region. [place], [place] [address], [phone]  Foreclosure prevention/Debt Services Family Services of the [employer] Credit Counseling Service inludes debt and foreclosure prevention programs for local families. This includes money management, financial advice, budget review and development of a written action plan with a Pensions consultant to help solve specific individual financial problems. In addition, housing and mortgage counselors can also provide pre- and post-purchase homeownership counseling, default resolution  counseling (to prevent foreclosure) and reverse mortgage counseling. A Debt Management Program allows people and families with a high level of credit card or medical debt to consolidate and repay consumer debt and loans to creditors and rebuild positive credit ratings and scores. Contact (336) D7650557.  Community clinics in [place] -Health Department [facility]: [address] [place], [place],  [address]. [phone].  -Health Department [place] [facility]: [address], [facility], [address]. [phone].  -[facility] offers medical care through a group of doctors, pharmacies and other healthcare related agencies that offer services for low income, uninsured adults in [place]. Also offers adult Dental care and assistance with applying for an [employer]. Call [phone].   [facility]. This center provides low-cost health care to those without health insurance. Services offered include an onsite pharmacy. Phone [phone]. [address], [address], [place].  -Medication Assistance Program serves as a link between pharmaceutical companies and patients to provide low cost or free prescription medications. This service is available for residents who meet certain income restrictions and have no insurance coverage. PLEASE CALL [phone] Jonette Nestle) OR [phone] ([place])  -One Step Further: Materials engineer, The [employer] Support & Nutrition Program, [employer]. Call [phone]/ [phone].  Food pantry and assistance -[facility]-Food Bank: [address], [place] [address]. Phone [phone]  -[facility] Food Pantry: [address], [place], [place] [address]. [phone].  -Missionary Ministry: has the purpose of visiting the sick and shut-ins and provide for needs in the surrounding communities. Call [phone]. Email:  stpaulbcinc@gmail .com This program provides: Food box for seniors, Financial assistance, Food to meet basic nutritional needs.  -Meals on Wheels with Senior Resources: [facility] residents age 80 and over who are homebound and unable to obtain and prepare a nutritious meal for themselves are eligible for this service. There may be a waiting list in certain parts of [facility] if the route in that area is full. If you are in [facility] and [place] call [phone] to register. For all other areas call ([phone] to register.  -Greater Dietitian: https://findfood.BargainContractor.si  TRANSPORTATION: -[employer] Department of Health: Call [facility] and [employer] at [phone] for details. AttractionGuides.es  -Access [place]: Access [place] is the [employer] Agency's shared-ride transportation service for eligible riders who have a disability that prevents them from riding the fixed route bus. Call [phone]. Access [place] riders must pay a fare of $1.50 per trip, or may purchase a 10-ride punch card for $14.00 ($1.40 per ride) or a 40-ride punch card for $48.00 ($1.20 per ride).  -The Shepherd's WHEELS rideshare transportation service is provided for senior citizens (60+) who live independently within [place] city limits and are unable to drive or have limited access to transportation. Call [phone] to schedule an appointment.  -[place] Transportation: For Medicare or Medicaid recipients call [phone]?[facility], wheelchair Carloyn Chi, and ambulatory quotes available.   FLEEING VIOLENCE: -Family Services of the [place]- 24/7 Crisis line [phone]) -[facility] Justice Centers: (336) 641-[phone] [phone])  [place] 2-1-1 is another useful way to locate resources in the community. Visit ShedSizes.ch to find service information online. If  you need additional assistance, 2-1-1 Referral Specialists are available 24 hours a day, every day by dialing 2-1-1 or [phone] from any phone. The call is free, confidential, and available in any language.  Affordable Housing Search http://www.nchousingsearch.[facility])   M-F 8a-3p [address]  [place], [place] [address] [phone] Services include: laundry, barbering, support groups, case management, phone & computer access, showers, AA/NA mtgs, mental health/substance abuse nurse, job skills class, disability information, VA assistance, spiritual classes, etc. Winter Shelter available when temperatures are less than 32 degrees.   HOMELESS SHELTERS [employer]  Night Shelter at [employer]- Call [phone] ext. [phone] or ext. [phone]. Located at [address], [place], [place] [address]  [facility] Mens Shelter- Call [phone]. Located at [address], [place] [address].  [facility]- [employer]. Call [phone]. Office located at [address] [place] [address].  [facility] Family Housing through [place] [phone].  [facility] Family Shelter- Call [phone]. Located at [address] [place], [place], [place] [address].  Room at the Inn-For Pregnant mothers. Call ([phone]. Located at [address] [place], [address].  [facility]-For men in [place]. Call [phone]. [address]-Shelter in [place]. Call [phone].  Home of [employer] for [employer] [phone]. Office located at [address] [place], [address].  [employer] be agreeable to help with chores. Call [phone] ext. 5000.  Men's: [address] [place], [place] [address]. Women's: [facility]  [address] [place], [place] [address]  Crisis Services [facility] Mobile Crisis Management- [phone]  [facility] [address], [place], [place] [address]. Phone: [phone]

## 2024-01-27 NOTE — Progress Notes (Signed)
 SLP Cancellation Note  Patient Details Name: Sue Graves MRN: [id number] DOB: 02-Feb-1996   Cancelled treatment:       Reason Eval/Treat Not Completed: Other (comment) (Patient receiving nursing care. SLP will f/u another date)  Jacqualine Mater, MA, CCC-SLP Speech Therapy

## 2024-01-28 LAB — CBC
HCT: 25.6 % — ABNORMAL LOW (ref 36.0–46.0)
Hemoglobin: 8.5 g/dL — ABNORMAL LOW (ref 12.0–15.0)
MCH: 31.7 pg (ref 26.0–34.0)
MCHC: 33.2 g/dL (ref 30.0–36.0)
MCV: 95.5 fL (ref 80.0–100.0)
Platelets: 223 10*3/uL (ref 150–400)
RBC: 2.68 MIL/uL — ABNORMAL LOW (ref 3.87–5.11)
RDW: 13.2 % (ref 11.5–15.5)
WBC: 11.5 10*3/uL — ABNORMAL HIGH (ref 4.0–10.5)
nRBC: 0 % (ref 0.0–0.2)

## 2024-01-28 LAB — BASIC METABOLIC PANEL WITH GFR
Anion gap: 9 (ref 5–15)
BUN: 6 mg/dL (ref 6–20)
CO2: 26 mmol/L (ref 22–32)
Calcium: 8.6 mg/dL — ABNORMAL LOW (ref 8.9–10.3)
Chloride: 100 mmol/L (ref 98–111)
Creatinine, Ser: 0.57 mg/dL (ref 0.44–1.00)
GFR, Estimated: >60 mL/min (ref >60)
Glucose, Bld: 110 mg/dL — ABNORMAL HIGH (ref 70–99)
Potassium: 3.6 mmol/L (ref 3.5–5.1)
Sodium: 135 mmol/L (ref 135–145)

## 2024-01-28 MED ORDER — ASPIRIN 81 MG PO TBEC
81.0000 mg | DELAYED_RELEASE_TABLET | Freq: Every day | ORAL | Status: DC
  Administered 2024-01-28 – 2024-02-01 (×5): 81 mg via ORAL
  Filled 2024-01-28 (×5): qty 1
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
## 2024-01-28 NOTE — Progress Notes (Signed)
 SLP Cancellation Note  Patient Details Name: Casaundra Takacs MRN: [id number] DOB: 04/18/1996   Cancelled treatment:       Reason Eval/Treat Not Completed: Other (comment) (Patient receiving assistance from significant other in bathroom. SLP will continue efforts.)   Jacqualine Mater, MA, CCC-SLP Speech Therapy

## 2024-01-28 NOTE — TOC Progression Note (Signed)
 Transition of Care (TOC) - Progression Note   Entered referral for OP PT . Information placed on AVS.   Ordered walker and 3 in1 with Jermaine with [employer]  Patient Details  Name: Sue Graves MRN: [id number] Date of Birth: Sep 19, 1995  Transition of Care [facility]) CM/SW Contact  Flavius Repsher, Arturo Late, RN Phone Number: 01/28/2024, 3:31 PM  Clinical Narrative:       Expected Discharge Plan: OP Rehab Barriers to Discharge: Continued Medical Work up  Expected Discharge Plan and Services       Living arrangements for the past 2 months: Single Family Home                                       Social Determinants of Health (SDOH) Interventions SDOH Screenings   Food Insecurity: Patient Unable To Answer (01/24/2024)  Housing: Patient Unable To Answer (01/24/2024)  Transportation Needs: Patient Unable To Answer (01/24/2024)  Utilities: Patient Unable To Answer (01/24/2024)  Tobacco Use: Medium Risk (01/25/2024)    Readmission Risk Interventions     No data to display
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
## 2024-01-29 LAB — CBC
HCT: 24.6 % — ABNORMAL LOW (ref 36.0–46.0)
Hemoglobin: 8.4 g/dL — ABNORMAL LOW (ref 12.0–15.0)
MCH: 32.3 pg (ref 26.0–34.0)
MCHC: 34.1 g/dL (ref 30.0–36.0)
MCV: 94.6 fL (ref 80.0–100.0)
Platelets: 266 10*3/uL (ref 150–400)
RBC: 2.6 MIL/uL — ABNORMAL LOW (ref 3.87–5.11)
RDW: 13.2 % (ref 11.5–15.5)
WBC: 10.7 10*3/uL — ABNORMAL HIGH (ref 4.0–10.5)
nRBC: 0 % (ref 0.0–0.2)

## 2024-01-29 LAB — BASIC METABOLIC PANEL WITH GFR
Anion gap: 8 (ref 5–15)
BUN: 6 mg/dL (ref 6–20)
CO2: 26 mmol/L (ref 22–32)
Calcium: 8.5 mg/dL — ABNORMAL LOW (ref 8.9–10.3)
Chloride: 100 mmol/L (ref 98–111)
Creatinine, Ser: 0.62 mg/dL (ref 0.44–1.00)
GFR, Estimated: >60 mL/min (ref >60)
Glucose, Bld: 104 mg/dL — ABNORMAL HIGH (ref 70–99)
Potassium: 3.3 mmol/L — ABNORMAL LOW (ref 3.5–5.1)
Sodium: 134 mmol/L — ABNORMAL LOW (ref 135–145)

## 2024-01-29 MED ORDER — POTASSIUM CHLORIDE CRYS ER 20 MEQ PO TBCR
20.0000 meq | EXTENDED_RELEASE_TABLET | Freq: Two times a day (BID) | ORAL | Status: AC
  Administered 2024-01-29 – 2024-01-31 (×6): 20 meq via ORAL
  Filled 2024-01-29 (×6): qty 1

## 2024-01-29 MED ORDER — FERROUS SULFATE 325 (65 FE) MG PO TABS
325.0000 mg | ORAL_TABLET | Freq: Every day | ORAL | Status: DC
  Administered 2024-01-30 – 2024-02-01 (×3): 325 mg via ORAL
  Filled 2024-01-29 (×3): qty 1

## 2024-01-29 MED ORDER — HYDROMORPHONE HCL 1 MG/ML IJ SOLN
0.5000 mg | Freq: Three times a day (TID) | INTRAMUSCULAR | Status: DC | PRN
  Administered 2024-01-31 – 2024-02-01 (×2): 0.5 mg via INTRAVENOUS
  Filled 2024-01-29 (×2): qty 0.5

## 2024-01-29 MED ORDER — ALUM & MAG HYDROXIDE-SIMETH 200-200-20 MG/5ML PO SUSP
30.0000 mL | Freq: Four times a day (QID) | ORAL | Status: DC | PRN
Start: 2024-01-29 — End: 2024-02-01
  Administered 2024-01-29: 30 mL via ORAL
  Filled 2024-01-29 (×2): qty 30

## 2024-01-29 MED ORDER — POTASSIUM CHLORIDE CRYS ER 20 MEQ PO TBCR: 40.0000 meq | EXTENDED_RELEASE_TABLET | Freq: Once | ORAL | Status: DC
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
## 2024-01-29 NOTE — Progress Notes (Addendum)
 Subjective: The patient is alert and pleasant.  Her boyfriend is at the bedside.  She has no complaints.  Objective: Vital signs in last 24 hours: Temp:  [98.6 F (37 C)-99.2 F (37.3 C)] 98.7 F (37.1 C) (06/12 0854) Pulse Rate:  [88-103] 88 (06/12 0854) Resp:  [16-17] 17 (06/12 0854) BP: (114-129)/(67-90) 122/74 (06/12 0854) SpO2:  [97 %-100 %] 100 % (06/12 0854) Estimated body mass index is 24.03 kg/m as calculated from the following:   Height as of this encounter: 4' 11.02 (1.499 m).   Weight as of this encounter: 54 kg.   Intake/Output from previous day: 06/11 0701 - 06/12 0700 In: 390 [P.O.:390] Out: -  Intake/Output this shift: No intake/output data recorded.  Physical exam the patient is alert and pleasant.  She is moving her lower extremities well.  Lab Results: Recent Labs    01/28/24 0636 01/29/24 0706  WBC 11.5* 10.7*  HGB 8.5* 8.4*  HCT 25.6* 24.6*  PLT 223 266   BMET Recent Labs    01/28/24 1235 01/29/24 0706  NA 135 134*  K 3.6 3.3*  CL 100 100  CO2 26 26  GLUCOSE 110* 104*  BUN 6 6  CREATININE 0.57 0.62  CALCIUM 8.6* 8.5*    Studies/Results: No results found.  Assessment/Plan: Postop day #4: We will continue to mobilize the patient with PT and her TLSO.  Anemia, hypokalemia.  I will add iron and potassium.  LOS: 5 days     Sue Graves 01/29/2024, 10:45 AM     Patient ID: Sue Graves, female   DOB: 07-Nov-1995, 28 y.o.   MRN: [id number]
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
## 2024-01-29 NOTE — Plan of Care (Signed)
  Problem: Activity: Goal: Risk for activity intolerance will decrease Outcome: Progressing   Problem: Nutrition: Goal: Adequate nutrition will be maintained Outcome: Progressing   Problem: Coping: Goal: Level of anxiety will decrease Outcome: Progressing   Problem: Pain Managment: Goal: General experience of comfort will improve and/or be controlled Outcome: Progressing   Problem: Safety: Goal: Ability to remain free from injury will improve Outcome: Progressing

## 2024-01-29 NOTE — TOC Progression Note (Signed)
 Transition of Care [facility]) - Progression Note    Patient Details  Name: Santoria Chason MRN: [id number] Date of Birth: 1995/10/20  [facility]) CM/SW Contact  Jennett Model, RN Phone Number: 01/29/2024, 12:58 PM  Clinical Narrative:    NCM made referral for outpatient speech for cognitive impairment per speech therapy.  Previous NCM made referral for outpt PT therapy , so just added this on for additonal service.   Expected Discharge Plan: OP Rehab Barriers to Discharge: Continued Medical Work up  Expected Discharge Plan and Services       Living arrangements for the past 2 months: Single Family Home                                       Social Determinants of Health (SDOH) Interventions SDOH Screenings   Food Insecurity: Patient Unable To Answer (01/24/2024)  Housing: Patient Unable To Answer (01/24/2024)  Transportation Needs: Patient Unable To Answer (01/24/2024)  Utilities: Patient Unable To Answer (01/24/2024)  Tobacco Use: Medium Risk (01/25/2024)    Readmission Risk Interventions     No data to display

## 2024-01-30 LAB — CBC
HCT: 26.6 % — ABNORMAL LOW (ref 36.0–46.0)
Hemoglobin: 8.9 g/dL — ABNORMAL LOW (ref 12.0–15.0)
MCH: 31.7 pg (ref 26.0–34.0)
MCHC: 33.5 g/dL (ref 30.0–36.0)
MCV: 94.7 fL (ref 80.0–100.0)
Platelets: 298 10*3/uL (ref 150–400)
RBC: 2.81 MIL/uL — ABNORMAL LOW (ref 3.87–5.11)
RDW: 13.2 % (ref 11.5–15.5)
WBC: 10.3 10*3/uL (ref 4.0–10.5)
nRBC: 0 % (ref 0.0–0.2)

## 2024-01-30 LAB — BASIC METABOLIC PANEL WITH GFR
Anion gap: 11 (ref 5–15)
BUN: 7 mg/dL (ref 6–20)
CO2: 25 mmol/L (ref 22–32)
Calcium: 9.2 mg/dL (ref 8.9–10.3)
Chloride: 101 mmol/L (ref 98–111)
Creatinine, Ser: 0.56 mg/dL (ref 0.44–1.00)
GFR, Estimated: >60 mL/min (ref >60)
Glucose, Bld: 105 mg/dL — ABNORMAL HIGH (ref 70–99)
Potassium: 3.9 mmol/L (ref 3.5–5.1)
Sodium: 137 mmol/L (ref 135–145)

## 2024-01-30 MED ORDER — POLYETHYLENE GLYCOL 3350 17 G PO PACK
17.0000 g | PACK | Freq: Two times a day (BID) | ORAL | Status: DC
  Administered 2024-01-30 – 2024-02-01 (×5): 17 g via ORAL
  Filled 2024-01-30 (×5): qty 1

## 2024-01-30 MED ORDER — METHOCARBAMOL 500 MG PO TABS
1000.0000 mg | ORAL_TABLET | Freq: Four times a day (QID) | ORAL | Status: DC
  Administered 2024-01-30 – 2024-02-01 (×9): 1000 mg via ORAL
  Filled 2024-01-30 (×9): qty 2

## 2024-01-30 MED ORDER — GABAPENTIN 400 MG PO CAPS
400.0000 mg | ORAL_CAPSULE | Freq: Three times a day (TID) | ORAL | Status: DC
  Administered 2024-01-30 – 2024-02-01 (×6): 400 mg via ORAL
  Filled 2024-01-30 (×6): qty 1

## 2024-01-30 MED ORDER — OXYCODONE HCL 5 MG PO TABS
10.0000 mg | ORAL_TABLET | ORAL | Status: DC | PRN
  Administered 2024-01-30 – 2024-02-01 (×6): 15 mg via ORAL
  Filled 2024-01-30 (×6): qty 3
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
## 2024-01-30 NOTE — Progress Notes (Signed)
 Subjective: The patient is alert and pleasant.  She complains of pain.  She hollered out in pain when I removed the honeycomb dressing.  Her boyfriend is at the bedside. Objective: Vital signs in last 24 hours: Temp:  [98 F (36.7 C)-98.7 F (37.1 C)] 98 F (36.7 C) (06/13 0600) Pulse Rate:  [88-98] 98 (06/13 0600) Resp:  [17-20] 20 (06/13 0600) BP: (122-155)/(62-93) 150/87 (06/13 0600) SpO2:  [100 %] 100 % (06/13 0600) Estimated body mass index is 24.03 kg/m as calculated from the following:   Height as of this encounter: 4' 11.02 (1.499 m).   Weight as of this encounter: 54 kg.   Intake/Output from previous day: 06/12 0701 - 06/13 0700 In: 480 [P.O.:480] Out: -  Intake/Output this shift: No intake/output data recorded.  Physical exam the patient is alert and oriented.  Her strength is normal.  Her lumbar wound is healing well.  Lab Results: Recent Labs    01/29/24 0706 01/30/24 0533  WBC 10.7* 10.3  HGB 8.4* 8.9*  HCT 24.6* 26.6*  PLT 266 298   BMET Recent Labs    01/29/24 0706 01/30/24 0533  NA 134* 137  K 3.3* 3.9  CL 100 101  CO2 26 25  GLUCOSE 104* 105*  BUN 6 7  CREATININE 0.62 0.56  CALCIUM  8.5* 9.2    Studies/Results: No results found.  Assessment/Plan: Status post L4-S1 instrumentation: The patient is slowly progressing.  LOS: 6 days     [name] 01/30/2024, 6:55 AM     Patient ID: Sue Graves, female   DOB: 1996-01-26, 28 y.o.   MRN: [id number]

## 2024-01-30 NOTE — Plan of Care (Signed)
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
## 2024-01-30 NOTE — TOC Progression Note (Signed)
 Transition of Care [facility]) - Progression Note    Patient Details  Name: Sue Graves MRN: [id number] Date of Birth: 1995/11/08  [facility] CM/SW Contact  Katherinne Mofield E Katianna Mcclenney, LCSW Phone Number: 01/30/2024, 2:45 PM  Clinical Narrative:    DC Saturday. DME and OP Rehab already arranged.   Expected Discharge Plan: OP Rehab Barriers to Discharge: Continued Medical Work up  Expected Discharge Plan and Services       Living arrangements for the past 2 months: Single Family Home                                       Social Determinants of Health (SDOH) Interventions SDOH Screenings   Food Insecurity: Patient Unable To Answer (01/24/2024)  Housing: Patient Unable To Answer (01/24/2024)  Transportation Needs: Patient Unable To Answer (01/24/2024)  Utilities: Patient Unable To Answer (01/24/2024)  Tobacco Use: Medium Risk (01/25/2024)    Readmission Risk Interventions     No data to display

## 2024-01-31 MED ORDER — ENSURE PLUS HIGH PROTEIN PO LIQD
237.0000 mL | Freq: Two times a day (BID) | ORAL | Status: DC
  Administered 2024-01-31: 237 mL via ORAL
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
## 2024-01-31 NOTE — Plan of Care (Signed)
  Problem: Pain Managment: Goal: General experience of comfort will improve and/or be controlled Outcome: Progressing   Problem: Safety: Goal: Ability to remain free from injury will improve Outcome: Progressing

## 2024-01-31 NOTE — Progress Notes (Addendum)
 Went in to see patient very upset - does not want to take pills other than pain pills.  States she thought she was going home but now someone told her she can't.  Very angry regarding.  MD paged regarding

## 2024-01-31 NOTE — Progress Notes (Signed)
 Patient crying in room I thought I was going home today   Paged MD and was advised that patient was not ready for discharge.  I went back to the room to notify patient - she at that time said I never said anything about going home
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
## 2024-01-31 NOTE — Plan of Care (Signed)

## 2024-01-31 NOTE — Progress Notes (Signed)
 Patient upset about the number of pills she has to take.  Accused staff of adding pills that are not ordered.  Yelling I don't like to take pills.  Advised  of what pills were for

## 2024-02-01 MED ORDER — ASPIRIN 81 MG PO TBEC: 81.0000 mg | DELAYED_RELEASE_TABLET | Freq: Every day | ORAL | 12 refills | Status: DC

## 2024-02-01 MED ORDER — METHOCARBAMOL 1000 MG PO TABS
500.0000 mg | ORAL_TABLET | Freq: Three times a day (TID) | ORAL | 0 refills | Status: DC | PRN
Start: 2024-02-01 — End: 2024-02-17

## 2024-02-01 MED ORDER — GABAPENTIN 400 MG PO CAPS: 400.0000 mg | ORAL_CAPSULE | Freq: Three times a day (TID) | ORAL | 0 refills | Status: DC

## 2024-02-01 MED ORDER — ASPIRIN 81 MG PO TBEC: 81.0000 mg | DELAYED_RELEASE_TABLET | Freq: Every day | ORAL | 12 refills | Status: AC

## 2024-02-01 MED ORDER — OXYCODONE HCL 5 MG PO TABS: 5.0000 mg | ORAL_TABLET | Freq: Four times a day (QID) | ORAL | 0 refills | Status: AC | PRN

## 2024-02-01 NOTE — Plan of Care (Signed)
  Problem: Pain Managment: Goal: General experience of comfort will improve and/or be controlled Outcome: Progressing   Problem: Safety: Goal: Ability to remain free from injury will improve Outcome: Progressing
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
## 2024-02-01 NOTE — Progress Notes (Signed)
 Reviewed AVS, patient expressed understanding of medications, MD follow up reviewed. Informed patient of the importance of wearing prescribed brace and doing breathing exercises with the incentive spirometer.  Removed IV, Site clean, dry and intact.  Patient states all belongings brought to the hospital at time of admission are accounted for and packed to take home.  Patient informed and expressed understanding on where to pickup discharge medications.  Patients boyfriend is in the room; he stated he would be transporting her home.    Patient acting very sleepy and moving slowly; Informed primary RN. Asked patient if she needed assistance getting dressed; she responded No. Requested patient contact front desk when she is dressed and ready to be transported to the front for discharge.
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
## 2024-02-06 ENCOUNTER — Emergency Department (HOSPITAL_COMMUNITY)
Admission: EM | Admit: 2024-02-06 | Discharge: 2024-02-06 | Disposition: A | Discharge status: Home / Self Care | Attending: Emergency Medicine | Admitting: Emergency Medicine

## 2024-02-06 ENCOUNTER — Encounter (HOSPITAL_COMMUNITY): Payer: Self-pay

## 2024-02-06 ENCOUNTER — Other Ambulatory Visit: Payer: Self-pay

## 2024-02-06 ENCOUNTER — Emergency Department (HOSPITAL_COMMUNITY)

## 2024-02-06 DIAGNOSIS — M545 Low back pain, unspecified: Secondary | ICD-10-CM | POA: Diagnosis present

## 2024-02-06 DIAGNOSIS — H1131 Conjunctival hemorrhage, right eye: Secondary | ICD-10-CM | POA: Diagnosis not present

## 2024-02-06 LAB — BASIC METABOLIC PANEL WITH GFR
Anion gap: 10 (ref 5–15)
BUN: 16 mg/dL (ref 6–20)
CO2: 26 mmol/L (ref 22–32)
Calcium: 9.9 mg/dL (ref 8.9–10.3)
Chloride: 104 mmol/L (ref 98–111)
Creatinine, Ser: 0.6 mg/dL (ref 0.44–1.00)
GFR, Estimated: >60 mL/min (ref >60)
Glucose, Bld: 88 mg/dL (ref 70–99)
Potassium: 3.6 mmol/L (ref 3.5–5.1)
Sodium: 140 mmol/L (ref 135–145)

## 2024-02-06 LAB — CBC WITH DIFFERENTIAL/PLATELET
Abs Immature Granulocytes: 0.07 10*3/uL (ref 0.00–0.07)
Basophils Absolute: 0 10*3/uL (ref 0.0–0.1)
Basophils Relative: 0 %
Eosinophils Absolute: 0.1 10*3/uL (ref 0.0–0.5)
Eosinophils Relative: 1 %
HCT: 26.9 % — ABNORMAL LOW (ref 36.0–46.0)
Hemoglobin: 8.6 g/dL — ABNORMAL LOW (ref 12.0–15.0)
Immature Granulocytes: 1 %
Lymphocytes Relative: 20 %
Lymphs Abs: 2.4 10*3/uL (ref 0.7–4.0)
MCH: 31.4 pg (ref 26.0–34.0)
MCHC: 32 g/dL (ref 30.0–36.0)
MCV: 98.2 fL (ref 80.0–100.0)
Monocytes Absolute: 0.6 10*3/uL (ref 0.1–1.0)
Monocytes Relative: 5 %
Neutro Abs: 8.6 10*3/uL — ABNORMAL HIGH (ref 1.7–7.7)
Neutrophils Relative %: 73 %
Platelets: 840 10*3/uL — ABNORMAL HIGH (ref 150–400)
RBC: 2.74 MIL/uL — ABNORMAL LOW (ref 3.87–5.11)
RDW: 13.7 % (ref 11.5–15.5)
WBC: 11.8 10*3/uL — ABNORMAL HIGH (ref 4.0–10.5)
nRBC: 0 % (ref 0.0–0.2)

## 2024-02-06 LAB — HCG, SERUM, QUALITATIVE: Preg, Serum: NEGATIVE

## 2024-02-06 MED ORDER — ACETAMINOPHEN 500 MG PO TABS
500.0000 mg | ORAL_TABLET | Freq: Once | ORAL | Status: AC
  Administered 2024-02-06: 500 mg via ORAL
  Filled 2024-02-06: qty 1

## 2024-02-06 MED ORDER — OXYCODONE HCL 5 MG PO TABS
5.0000 mg | ORAL_TABLET | Freq: Once | ORAL | Status: AC
  Administered 2024-02-06: 5 mg via ORAL
  Filled 2024-02-06: qty 1

## 2024-02-06 MED ORDER — GABAPENTIN 300 MG PO CAPS
400.0000 mg | ORAL_CAPSULE | Freq: Once | ORAL | Status: AC
  Administered 2024-02-06: 400 mg via ORAL
  Filled 2024-02-06: qty 1

## 2024-02-06 MED ORDER — KETOROLAC TROMETHAMINE 30 MG/ML IJ SOLN
30.0000 mg | Freq: Once | INTRAMUSCULAR | Status: AC
  Administered 2024-02-06: 30 mg via INTRAVENOUS
  Filled 2024-02-06: qty 1

## 2024-02-06 NOTE — ED Notes (Signed)
 20G IV removed from left AC.  IV intact, gauze applied, bleeding controlled.

## 2024-02-06 NOTE — ED Notes (Signed)
 Pt in back brace and has blood in scattered in right eye sclera.  DO Sharpe at bedside.
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
## 2024-02-06 NOTE — ED Notes (Signed)
 Awaiting patient from lobby.

## 2024-02-06 NOTE — Discharge Instructions (Addendum)
 Please continue all medications as previously prescribed.  You may also continue Tylenol  and ibuprofen . Please follow-up with your surgeon and primary care doctor soon as possible. Thank you for allowing us  to take care of you today.  We hope you begin feeling better soon.   To-Do:  Please follow-up with your primary doctor within the next 2-3 days. Please return to the Emergency Department or call 911 if you experience chest pain, shortness of breath, severe pain, severe fever, altered mental status, or have any reason to think that you need emergency medical care.  Thank you again.  Hope you feel better soon.  [facility] Department of Emergency Medicine
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
## 2024-02-17 ENCOUNTER — Other Ambulatory Visit: Payer: Self-pay | Admitting: Family

## 2024-02-17 ENCOUNTER — Encounter: Payer: Self-pay | Admitting: Family

## 2024-02-17 ENCOUNTER — Ambulatory Visit (INDEPENDENT_AMBULATORY_CARE_PROVIDER_SITE_OTHER): Admitting: Family

## 2024-02-17 VITALS — BP 103/62 | HR 83 | Resp 16 | Ht 60.0 in

## 2024-02-17 DIAGNOSIS — F418 Other specified anxiety disorders: Secondary | ICD-10-CM

## 2024-02-17 DIAGNOSIS — M545 Low back pain, unspecified: Secondary | ICD-10-CM

## 2024-02-17 DIAGNOSIS — Z09 Encounter for follow-up examination after completed treatment for conditions other than malignant neoplasm: Secondary | ICD-10-CM | POA: Diagnosis not present

## 2024-02-17 DIAGNOSIS — K683 Retroperitoneal hematoma: Secondary | ICD-10-CM

## 2024-02-17 DIAGNOSIS — S32058D Other fracture of fifth lumbar vertebra, subsequent encounter for fracture with routine healing: Secondary | ICD-10-CM | POA: Diagnosis not present

## 2024-02-17 DIAGNOSIS — F419 Anxiety disorder, unspecified: Secondary | ICD-10-CM

## 2024-02-17 DIAGNOSIS — R58 Hemorrhage, not elsewhere classified: Secondary | ICD-10-CM

## 2024-02-17 DIAGNOSIS — Z7689 Persons encountering health services in other specified circumstances: Secondary | ICD-10-CM

## 2024-02-17 MED ORDER — CYCLOBENZAPRINE HCL 5 MG PO TABS: 5.0000 mg | ORAL_TABLET | Freq: Three times a day (TID) | ORAL | 1 refills | Status: AC | PRN

## 2024-02-17 MED ORDER — METHOCARBAMOL 1000 MG PO TABS: 500.0000 mg | ORAL_TABLET | Freq: Three times a day (TID) | ORAL | 1 refills | Status: AC | PRN

## 2024-02-17 MED ORDER — GABAPENTIN 400 MG PO CAPS: 400.0000 mg | ORAL_CAPSULE | Freq: Three times a day (TID) | ORAL | 2 refills | Status: AC

## 2024-02-17 MED ORDER — ESCITALOPRAM OXALATE 10 MG PO TABS
10.0000 mg | ORAL_TABLET | Freq: Every day | ORAL | 0 refills | Status: AC
Start: 2024-02-17 — End: ?

## 2024-02-17 MED ORDER — HYDROXYZINE PAMOATE 25 MG PO CAPS: 25.0000 mg | ORAL_CAPSULE | Freq: Three times a day (TID) | ORAL | 2 refills | Status: AC | PRN
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
## 2024-02-17 NOTE — Telephone Encounter (Signed)
 Cyclobenzaprine  prescribed as alternative.

## 2024-02-18 NOTE — Telephone Encounter (Signed)
 Cyclobenzaprine  prescribed as alternative on 02/17/2024  5:08 pm.

## 2024-02-19 ENCOUNTER — Ambulatory Visit: Admitting: Speech Pathology

## 2024-02-19 ENCOUNTER — Ambulatory Visit: Admitting: Physical Therapy
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
## 2024-02-24 ENCOUNTER — Inpatient Hospital Stay: Admitting: Family

## 2024-03-16 ENCOUNTER — Ambulatory Visit: Attending: General Surgery | Admitting: Physical Therapy

## 2024-03-16 ENCOUNTER — Ambulatory Visit: Admitting: Speech Pathology

## 2024-03-16 ENCOUNTER — Encounter: Payer: Self-pay | Admitting: Speech Pathology

## 2024-03-16 DIAGNOSIS — R2681 Unsteadiness on feet: Secondary | ICD-10-CM | POA: Diagnosis present

## 2024-03-16 DIAGNOSIS — R2689 Other abnormalities of gait and mobility: Secondary | ICD-10-CM | POA: Diagnosis present

## 2024-03-16 DIAGNOSIS — M5459 Other low back pain: Secondary | ICD-10-CM | POA: Insufficient documentation

## 2024-03-16 DIAGNOSIS — M6281 Muscle weakness (generalized): Secondary | ICD-10-CM | POA: Diagnosis present

## 2024-03-16 DIAGNOSIS — R41841 Cognitive communication deficit: Secondary | ICD-10-CM
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
## 2024-03-17 ENCOUNTER — Other Ambulatory Visit: Payer: Self-pay

## 2024-03-17 ENCOUNTER — Ambulatory Visit: Admitting: Physical Therapy

## 2024-03-17 ENCOUNTER — Encounter: Payer: Self-pay | Admitting: Physical Therapy

## 2024-03-17 DIAGNOSIS — R2689 Other abnormalities of gait and mobility: Secondary | ICD-10-CM

## 2024-03-17 DIAGNOSIS — M6281 Muscle weakness (generalized): Secondary | ICD-10-CM

## 2024-03-17 DIAGNOSIS — R2681 Unsteadiness on feet: Secondary | ICD-10-CM

## 2024-03-17 DIAGNOSIS — M5459 Other low back pain: Secondary | ICD-10-CM
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
## 2024-03-22 ENCOUNTER — Encounter: Admitting: Family

## 2024-03-22 NOTE — Progress Notes (Signed)
 Erroneous encounter-disregard

## 2024-03-26 ENCOUNTER — Ambulatory Visit: Attending: General Surgery | Admitting: Physical Therapy

## 2024-03-31 ENCOUNTER — Ambulatory Visit: Attending: General Surgery | Admitting: Physical Therapy

## 2024-04-02 ENCOUNTER — Ambulatory Visit: Admitting: Physical Therapy

## 2024-04-09 ENCOUNTER — Ambulatory Visit: Admitting: Physical Therapy

## 2024-04-12 ENCOUNTER — Ambulatory Visit: Attending: General Surgery | Admitting: Physical Therapy

## 2024-04-12 ENCOUNTER — Encounter: Payer: Self-pay | Admitting: Physical Therapy

## 2024-04-12 ENCOUNTER — Telehealth: Payer: Self-pay | Admitting: Physical Therapy

## 2024-04-12 NOTE — Therapy (Signed)
 [facility] [address] [place], [place], [address] Phone: [phone]   Fax:  [phone]  Patient Details - DISCHARGE SUMMARY Name: Sue Graves MRN: [id number] Date of Birth: 05-Jan-1996 Referring Provider:  No ref. provider found  Encounter Date: 04/12/2024  Pt is being discharged after 5th no show/cancellation.  Will cancel remaining appts.  Pt did not return following PT eval.  Daved KATHEE Bull, PT, DPT 04/12/2024, 3:50 PM  [facility] [address] [place], [place], [address] Phone: [phone]   Fax:  [phone]

## 2024-04-12 NOTE — Telephone Encounter (Signed)
 Left message for pt regarding appt today and that if she is unable to make this appt PT will have to cancel remaining appts due to prior no shows/cancellations and clinic policy.  Daved Bull, PT, DPT

## 2024-04-23 ENCOUNTER — Ambulatory Visit: Admitting: Physical Therapy

## 2024-04-27 ENCOUNTER — Encounter: Admitting: Family

## 2024-04-27 NOTE — Progress Notes (Signed)
 Erroneous encounter-disregard

## 2024-04-28 ENCOUNTER — Telehealth: Payer: Self-pay | Admitting: Family

## 2024-04-28 ENCOUNTER — Encounter: Admitting: Family

## 2024-04-28 NOTE — Telephone Encounter (Signed)
 Called pt and left vm to call office back to reschedule missed appt

## 2024-04-28 NOTE — Progress Notes (Signed)
 Erroneous encounter-disregard

## 2024-04-29 ENCOUNTER — Ambulatory Visit: Admitting: Physical Therapy

## 2024-05-03 ENCOUNTER — Ambulatory Visit: Admitting: Physical Therapy

## 2024-05-18 ENCOUNTER — Encounter: Admitting: Speech Pathology

## 2024-05-18 ENCOUNTER — Ambulatory Visit: Attending: General Surgery | Admitting: Speech Pathology

## 2024-06-02 ENCOUNTER — Encounter: Admitting: Speech Pathology

## 2024-06-09 ENCOUNTER — Ambulatory Visit: Attending: General Surgery | Admitting: Speech Pathology

## 2024-06-16 ENCOUNTER — Ambulatory Visit: Admitting: Speech Pathology

## 2024-06-23 ENCOUNTER — Encounter: Admitting: Speech Pathology
